# Patient Record
Sex: Female | Born: 1985 | Race: White | Hispanic: No | Marital: Single | Smoking: Current every day smoker
Health system: Southern US, Community
[De-identification: ages and names within clinical notes are randomized; demographics above are authoritative.]

## PROBLEM LIST (undated history)

## (undated) DIAGNOSIS — F1120 Opioid dependence, uncomplicated: Secondary | ICD-10-CM

## (undated) DIAGNOSIS — N179 Acute kidney failure, unspecified: Secondary | ICD-10-CM

## (undated) DIAGNOSIS — M6282 Rhabdomyolysis: Secondary | ICD-10-CM

## (undated) DIAGNOSIS — Z8742 Personal history of other diseases of the female genital tract: Secondary | ICD-10-CM

## (undated) HISTORY — DX: Rhabdomyolysis: M62.82

## (undated) HISTORY — PX: KNEE SURGERY: SHX244

## (undated) HISTORY — DX: Opioid dependence, uncomplicated: F11.20

## (undated) HISTORY — PX: TUBAL LIGATION: SHX77

## (undated) HISTORY — DX: Personal history of other diseases of the female genital tract: Z87.42

## (undated) HISTORY — DX: Acute kidney failure, unspecified: N17.9

---
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
## 2004-06-19 ENCOUNTER — Emergency Department (HOSPITAL_COMMUNITY): Admission: EM | Admit: 2004-06-19 | Discharge: 2004-06-19 | Payer: Self-pay | Admitting: Emergency Medicine

## 2004-08-08 ENCOUNTER — Emergency Department (HOSPITAL_COMMUNITY): Admission: EM | Admit: 2004-08-08 | Discharge: 2004-08-08 | Payer: Self-pay | Admitting: *Deleted

## 2005-11-28 ENCOUNTER — Emergency Department (HOSPITAL_COMMUNITY): Admission: EM | Admit: 2005-11-28 | Discharge: 2005-11-28 | Payer: Self-pay | Admitting: Emergency Medicine

## 2006-11-16 ENCOUNTER — Encounter: Payer: Self-pay | Admitting: Advanced Practice Midwife

## 2006-11-16 ENCOUNTER — Inpatient Hospital Stay (HOSPITAL_COMMUNITY): Admission: AD | Admit: 2006-11-16 | Discharge: 2006-11-18 | Payer: Self-pay | Admitting: Obstetrics and Gynecology

## 2006-11-16 ENCOUNTER — Ambulatory Visit: Payer: Self-pay | Admitting: Obstetrics & Gynecology

## 2007-06-28 ENCOUNTER — Other Ambulatory Visit: Admission: RE | Admit: 2007-06-28 | Discharge: 2007-06-28 | Payer: Self-pay | Admitting: Unknown Physician Specialty

## 2009-03-18 ENCOUNTER — Emergency Department (HOSPITAL_COMMUNITY): Admission: EM | Admit: 2009-03-18 | Discharge: 2009-03-18 | Payer: Self-pay | Admitting: Emergency Medicine

## 2009-10-30 ENCOUNTER — Encounter: Admission: RE | Admit: 2009-10-30 | Discharge: 2009-10-30 | Payer: Self-pay | Admitting: Family Medicine

## 2010-03-10 ENCOUNTER — Encounter
Admission: RE | Admit: 2010-03-10 | Discharge: 2010-03-10 | Payer: Self-pay | Source: Home / Self Care | Attending: Orthopedic Surgery | Admitting: Orthopedic Surgery

## 2010-03-25 ENCOUNTER — Inpatient Hospital Stay (HOSPITAL_COMMUNITY)
Admission: AD | Admit: 2010-03-25 | Discharge: 2010-03-25 | Payer: Self-pay | Source: Home / Self Care | Attending: Obstetrics and Gynecology | Admitting: Obstetrics and Gynecology

## 2010-03-25 DIAGNOSIS — K429 Umbilical hernia without obstruction or gangrene: Secondary | ICD-10-CM

## 2010-03-25 LAB — URINALYSIS, ROUTINE W REFLEX MICROSCOPIC
Bilirubin Urine: NEGATIVE
Hgb urine dipstick: NEGATIVE
Specific Gravity, Urine: 1.02 (ref 1.005–1.030)
Urine Glucose, Fasting: NEGATIVE mg/dL
Urobilinogen, UA: 0.2 mg/dL (ref 0.0–1.0)

## 2010-03-25 LAB — CBC
MCH: 29.8 pg (ref 26.0–34.0)
MCHC: 32.9 g/dL (ref 30.0–36.0)
Platelets: 233 10*3/uL (ref 150–400)

## 2010-06-18 ENCOUNTER — Inpatient Hospital Stay (HOSPITAL_COMMUNITY)
Admission: AD | Admit: 2010-06-18 | Discharge: 2010-06-20 | DRG: 775 | Disposition: A | Discharge status: Home / Self Care | Payer: Medicaid Other | Source: Ambulatory Visit | Attending: Obstetrics and Gynecology | Admitting: Obstetrics and Gynecology

## 2010-06-18 LAB — CBC
MCH: 29.1 pg (ref 26.0–34.0)
MCV: 88.3 fL (ref 78.0–100.0)
Platelets: 266 10*3/uL (ref 150–400)
RDW: 13.3 % (ref 11.5–15.5)
WBC: 11.6 10*3/uL — ABNORMAL HIGH (ref 4.0–10.5)

## 2010-06-18 LAB — ABO/RH: ABO/RH(D): O POS

## 2010-06-19 LAB — CBC
Hemoglobin: 11.9 g/dL — ABNORMAL LOW (ref 12.0–15.0)
MCH: 29 pg (ref 26.0–34.0)
MCHC: 32.2 g/dL (ref 30.0–36.0)
Platelets: 206 10*3/uL (ref 150–400)
RDW: 13.4 % (ref 11.5–15.5)

## 2010-07-15 ENCOUNTER — Other Ambulatory Visit: Payer: Self-pay | Admitting: Obstetrics and Gynecology

## 2010-07-15 ENCOUNTER — Encounter (HOSPITAL_COMMUNITY): Payer: Medicaid Other | Attending: Obstetrics and Gynecology

## 2010-07-15 DIAGNOSIS — Z01812 Encounter for preprocedural laboratory examination: Secondary | ICD-10-CM | POA: Insufficient documentation

## 2010-07-15 DIAGNOSIS — Z01818 Encounter for other preprocedural examination: Secondary | ICD-10-CM | POA: Insufficient documentation

## 2010-07-15 LAB — CBC
HCT: 42.4 % (ref 36.0–46.0)
Hemoglobin: 13.9 g/dL (ref 12.0–15.0)
MCV: 88.7 fL (ref 78.0–100.0)
RBC: 4.78 MIL/uL (ref 3.87–5.11)
RDW: 12.8 % (ref 11.5–15.5)
WBC: 8.6 10*3/uL (ref 4.0–10.5)

## 2010-07-15 LAB — SURGICAL PCR SCREEN: MRSA, PCR: NEGATIVE

## 2010-07-23 LAB — PREGNANCY, URINE: Preg Test, Ur: NEGATIVE

## 2010-08-02 ENCOUNTER — Inpatient Hospital Stay (HOSPITAL_COMMUNITY)
Admission: AD | Admit: 2010-08-02 | Discharge: 2010-08-02 | Disposition: A | Discharge status: Other Acute Inpatient Hospital | Payer: Medicaid Other | Source: Ambulatory Visit | Attending: Obstetrics and Gynecology | Admitting: Obstetrics and Gynecology

## 2010-08-02 ENCOUNTER — Emergency Department (HOSPITAL_COMMUNITY)
Admission: EM | Admit: 2010-08-02 | Discharge: 2010-08-02 | Disposition: A | Discharge status: Home / Self Care | Payer: Medicaid Other | Attending: Emergency Medicine | Admitting: Emergency Medicine

## 2010-08-02 DIAGNOSIS — O99893 Other specified diseases and conditions complicating puerperium: Secondary | ICD-10-CM | POA: Insufficient documentation

## 2010-08-02 DIAGNOSIS — K420 Umbilical hernia with obstruction, without gangrene: Secondary | ICD-10-CM | POA: Insufficient documentation

## 2010-08-02 DIAGNOSIS — O9989 Other specified diseases and conditions complicating pregnancy, childbirth and the puerperium: Secondary | ICD-10-CM

## 2010-08-02 DIAGNOSIS — K429 Umbilical hernia without obstruction or gangrene: Secondary | ICD-10-CM | POA: Insufficient documentation

## 2010-08-02 DIAGNOSIS — F17200 Nicotine dependence, unspecified, uncomplicated: Secondary | ICD-10-CM | POA: Insufficient documentation

## 2010-08-02 DIAGNOSIS — R109 Unspecified abdominal pain: Secondary | ICD-10-CM

## 2010-08-02 DIAGNOSIS — R10815 Periumbilic abdominal tenderness: Secondary | ICD-10-CM | POA: Insufficient documentation

## 2010-08-02 LAB — URINALYSIS, ROUTINE W REFLEX MICROSCOPIC
Hgb urine dipstick: NEGATIVE
Nitrite: NEGATIVE
Protein, ur: NEGATIVE mg/dL
Specific Gravity, Urine: >1.03 — ABNORMAL HIGH (ref 1.005–1.030)
Urobilinogen, UA: 0.2 mg/dL (ref 0.0–1.0)

## 2010-08-02 LAB — POCT PREGNANCY, URINE: Preg Test, Ur: NEGATIVE

## 2010-08-12 ENCOUNTER — Other Ambulatory Visit (HOSPITAL_COMMUNITY): Payer: Medicaid Other

## 2010-08-20 ENCOUNTER — Ambulatory Visit (HOSPITAL_COMMUNITY)
Admission: RE | Admit: 2010-08-20 | Discharge: 2010-08-20 | Disposition: A | Discharge status: Home / Self Care | Payer: Medicaid Other | Source: Ambulatory Visit | Attending: Obstetrics and Gynecology | Admitting: Obstetrics and Gynecology

## 2010-08-20 DIAGNOSIS — Z01818 Encounter for other preprocedural examination: Secondary | ICD-10-CM | POA: Insufficient documentation

## 2010-08-20 DIAGNOSIS — Z302 Encounter for sterilization: Secondary | ICD-10-CM | POA: Insufficient documentation

## 2010-08-20 DIAGNOSIS — Z01812 Encounter for preprocedural laboratory examination: Secondary | ICD-10-CM | POA: Insufficient documentation

## 2010-08-20 LAB — CBC
HCT: 42 % (ref 36.0–46.0)
Hemoglobin: 13.9 g/dL (ref 12.0–15.0)
MCV: 88.8 fL (ref 78.0–100.0)
RBC: 4.73 MIL/uL (ref 3.87–5.11)
WBC: 8.7 10*3/uL (ref 4.0–10.5)

## 2010-08-20 LAB — SURGICAL PCR SCREEN
MRSA, PCR: NEGATIVE
Staphylococcus aureus: NEGATIVE

## 2010-08-22 ENCOUNTER — Inpatient Hospital Stay (HOSPITAL_COMMUNITY)
Admission: AD | Admit: 2010-08-22 | Discharge: 2010-08-22 | Disposition: A | Discharge status: Home / Self Care | Payer: Medicaid Other | Source: Ambulatory Visit | Attending: Obstetrics and Gynecology | Admitting: Obstetrics and Gynecology

## 2010-08-22 DIAGNOSIS — G8918 Other acute postprocedural pain: Secondary | ICD-10-CM | POA: Insufficient documentation

## 2010-08-22 LAB — CBC
Hemoglobin: 11.6 g/dL — ABNORMAL LOW (ref 12.0–15.0)
MCH: 28.8 pg (ref 26.0–34.0)
MCHC: 32.3 g/dL (ref 30.0–36.0)
MCV: 89.1 fL (ref 78.0–100.0)
RBC: 4.03 MIL/uL (ref 3.87–5.11)

## 2010-08-28 ENCOUNTER — Emergency Department (HOSPITAL_COMMUNITY)
Admission: EM | Admit: 2010-08-28 | Discharge: 2010-08-28 | Disposition: A | Discharge status: Home / Self Care | Payer: Self-pay | Attending: Emergency Medicine | Admitting: Emergency Medicine

## 2010-08-28 DIAGNOSIS — S61409A Unspecified open wound of unspecified hand, initial encounter: Secondary | ICD-10-CM | POA: Insufficient documentation

## 2010-08-28 DIAGNOSIS — M79609 Pain in unspecified limb: Secondary | ICD-10-CM | POA: Insufficient documentation

## 2010-08-28 DIAGNOSIS — Y92009 Unspecified place in unspecified non-institutional (private) residence as the place of occurrence of the external cause: Secondary | ICD-10-CM | POA: Insufficient documentation

## 2010-08-28 DIAGNOSIS — W261XXA Contact with sword or dagger, initial encounter: Secondary | ICD-10-CM | POA: Insufficient documentation

## 2010-08-28 DIAGNOSIS — W260XXA Contact with knife, initial encounter: Secondary | ICD-10-CM | POA: Insufficient documentation

## 2010-09-03 NOTE — Op Note (Signed)
  NAMEKERRYN, Alejandra Park              ACCOUNT NO.:  [id number]  MEDICAL RECORD NO.:  [id number]  LOCATION:  [facility]                          FACILITY:  [facility]  PHYSICIAN:  Malva Limes, M.D.    DATE OF BIRTH:  1985-04-07  DATE OF PROCEDURE:  08/20/2010 DATE OF DISCHARGE:                              OPERATIVE REPORT   PREOPERATIVE DIAGNOSIS:  The patient desires permanent sterilization.  POSTOPERATIVE DIAGNOSIS:  The patient desires permanent sterilization.  PROCEDURE:  Laparoscopy with application of Filshie clips.  SURGEON:  Malva Limes, MD.  ANESTHESIA:  General with local.  ANTIBIOTIC:  Ancef 1 gram.  DRAINS:  Foley bedside drainage.  ESTIMATED BLOOD LOSS:  Minimal.  SPECIMENS:  None.  COMPLICATIONS:  None.  FINDINGS:  The patient had normal fallopian tubes and ovaries bilaterally.  The uterus appeared to be normal.  There is no evidence of adhesions or endometriosis.  The patient did have a 1-cm umbilical hernia.  DESCRIPTION OF PROCEDURE:  The patient was taken to the operating room where a general anesthetic was administered without difficulty.  She was then placed in dorsal lithotomy position.  She was prepped and draped in the usual fashion for this procedure.  A Hulka tenaculum was applied to the anterior cervical lip.  The patient was prepped.  The bladder was drained.  The umbilicus was injected with 0.25% Marcaine.  A vertical skin incision was made.  The hernia was identified.  The hernia was enlarged and the parietal peritoneum entered with blunt dissection.  The 12-mm Hasson cannula was placed into the abdominal cavity and sutured in place.  Three liters of carbon dioxide was insufflated.  The patient was placed in Trendelenburg and examination of the abdominal and pelvic contents was undertaken.  At this point, a Filshie clip was placed in the isthmic portion of the right fallopian tube.  The entire tube appeared to be within the clasp.  The clasp  appeared to be tightly closed.  It was placed perpendicular to the tube.  A similar procedure was performed on the opposite side.  At this point, the pneumoperitoneum was released.  The instruments removed.  The fascia and peritoneum were closed with interrupted 0 Vicryl suture.  The skin with 4-0 Vicryl suture.  The patient was extubated, taken to recovery room in stable condition. Instrument and lap counts correct x2.  The patient was discharged to home.  She will be follow up in the office in 4 weeks.  She was sent home with Percocet to take p.r.n.          ______________________________ Malva Limes, M.D.     MA/MEDQ  D:  08/20/2010  T:  08/21/2010  Job:  [id number]  Electronically Signed by Malva Limes M.D. on 09/03/2010 08:49:12 PM

## 2010-09-16 ENCOUNTER — Ambulatory Visit: Payer: Commercial Managed Care - PPO | Admitting: Physical Therapy

## 2010-10-01 ENCOUNTER — Ambulatory Visit: Payer: Commercial Managed Care - PPO | Attending: Family Medicine | Admitting: Physical Therapy

## 2010-10-01 DIAGNOSIS — M545 Low back pain, unspecified: Secondary | ICD-10-CM | POA: Insufficient documentation

## 2010-10-01 DIAGNOSIS — IMO0001 Reserved for inherently not codable concepts without codable children: Secondary | ICD-10-CM | POA: Insufficient documentation

## 2010-10-01 DIAGNOSIS — R293 Abnormal posture: Secondary | ICD-10-CM | POA: Insufficient documentation

## 2010-10-01 DIAGNOSIS — M6281 Muscle weakness (generalized): Secondary | ICD-10-CM | POA: Insufficient documentation

## 2010-10-07 ENCOUNTER — Ambulatory Visit: Payer: Commercial Managed Care - PPO | Admitting: Physical Therapy

## 2010-10-09 ENCOUNTER — Ambulatory Visit: Payer: Commercial Managed Care - PPO | Admitting: Physical Therapy

## 2010-10-13 ENCOUNTER — Encounter: Payer: Commercial Managed Care - PPO | Admitting: Physical Therapy

## 2010-10-15 ENCOUNTER — Encounter: Payer: Commercial Managed Care - PPO | Admitting: Physical Therapy

## 2010-10-29 ENCOUNTER — Ambulatory Visit: Payer: Commercial Managed Care - PPO | Attending: Family Medicine

## 2010-10-29 DIAGNOSIS — M545 Low back pain, unspecified: Secondary | ICD-10-CM | POA: Insufficient documentation

## 2010-10-29 DIAGNOSIS — R293 Abnormal posture: Secondary | ICD-10-CM | POA: Insufficient documentation

## 2010-10-29 DIAGNOSIS — M6281 Muscle weakness (generalized): Secondary | ICD-10-CM | POA: Insufficient documentation

## 2010-10-29 DIAGNOSIS — IMO0001 Reserved for inherently not codable concepts without codable children: Secondary | ICD-10-CM | POA: Insufficient documentation

## 2010-10-30 ENCOUNTER — Ambulatory Visit: Payer: Commercial Managed Care - PPO

## 2010-11-13 ENCOUNTER — Ambulatory Visit: Payer: Commercial Managed Care - PPO

## 2010-12-04 LAB — RAPID URINE DRUG SCREEN, HOSP PERFORMED
Benzodiazepines: NOT DETECTED
Cocaine: NOT DETECTED
Opiates: NOT DETECTED
Tetrahydrocannabinol: NOT DETECTED

## 2010-12-04 LAB — CBC
HCT: 40.4
MCV: 89.3
Platelets: 294
RBC: 4.52
WBC: 14.4 — ABNORMAL HIGH

## 2011-05-23 ENCOUNTER — Emergency Department (HOSPITAL_COMMUNITY)
Admission: EM | Admit: 2011-05-23 | Discharge: 2011-05-24 | Disposition: A | Discharge status: Home / Self Care | Payer: Commercial Managed Care - PPO | Attending: Emergency Medicine | Admitting: Emergency Medicine

## 2011-05-23 ENCOUNTER — Encounter (HOSPITAL_COMMUNITY): Payer: Self-pay | Admitting: Emergency Medicine

## 2011-05-23 ENCOUNTER — Emergency Department (HOSPITAL_COMMUNITY): Payer: Commercial Managed Care - PPO

## 2011-05-23 DIAGNOSIS — M25569 Pain in unspecified knee: Secondary | ICD-10-CM | POA: Insufficient documentation

## 2011-05-23 DIAGNOSIS — R079 Chest pain, unspecified: Secondary | ICD-10-CM | POA: Insufficient documentation

## 2011-05-23 DIAGNOSIS — M545 Low back pain, unspecified: Secondary | ICD-10-CM | POA: Insufficient documentation

## 2011-05-23 DIAGNOSIS — G8929 Other chronic pain: Secondary | ICD-10-CM | POA: Insufficient documentation

## 2011-05-23 DIAGNOSIS — IMO0002 Reserved for concepts with insufficient information to code with codable children: Secondary | ICD-10-CM | POA: Insufficient documentation

## 2011-05-23 DIAGNOSIS — M542 Cervicalgia: Secondary | ICD-10-CM | POA: Insufficient documentation

## 2011-05-23 DIAGNOSIS — S2220XA Unspecified fracture of sternum, initial encounter for closed fracture: Secondary | ICD-10-CM | POA: Insufficient documentation

## 2011-05-23 DIAGNOSIS — M25579 Pain in unspecified ankle and joints of unspecified foot: Secondary | ICD-10-CM | POA: Insufficient documentation

## 2011-05-23 NOTE — ED Notes (Signed)
Patient's responses are appropriate per question, however responses are sluggish.
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
## 2011-05-24 ENCOUNTER — Emergency Department (HOSPITAL_COMMUNITY): Payer: Commercial Managed Care - PPO

## 2011-05-24 ENCOUNTER — Other Ambulatory Visit: Payer: Self-pay

## 2011-05-24 ENCOUNTER — Encounter (HOSPITAL_COMMUNITY): Payer: Self-pay | Admitting: Emergency Medicine

## 2011-05-24 LAB — URINALYSIS, MICROSCOPIC ONLY
Glucose, UA: NEGATIVE mg/dL
Nitrite: NEGATIVE
Specific Gravity, Urine: 1.03 (ref 1.005–1.030)
pH: 6 (ref 5.0–8.0)

## 2011-05-24 LAB — CBC
Hemoglobin: 13.3 g/dL (ref 12.0–15.0)
Platelets: 236 10*3/uL (ref 150–400)
RBC: 4.5 MIL/uL (ref 3.87–5.11)
WBC: 10.9 10*3/uL — ABNORMAL HIGH (ref 4.0–10.5)

## 2011-05-24 LAB — PROTIME-INR: INR: 1.02 (ref 0.00–1.49)

## 2011-05-24 LAB — POCT I-STAT, CHEM 8
BUN: 10 mg/dL (ref 6–23)
Chloride: 107 mEq/L (ref 96–112)
Potassium: 3.5 mEq/L (ref 3.5–5.1)
Sodium: 143 mEq/L (ref 135–145)
TCO2: 26 mmol/L (ref 0–100)

## 2011-05-24 LAB — POCT PREGNANCY, URINE: Preg Test, Ur: NEGATIVE

## 2011-05-24 LAB — COMPREHENSIVE METABOLIC PANEL
AST: 15 U/L (ref 0–37)
BUN: 11 mg/dL (ref 6–23)
CO2: 27 mEq/L (ref 19–32)
Calcium: 9 mg/dL (ref 8.4–10.5)
Chloride: 105 mEq/L (ref 96–112)
Creatinine, Ser: 0.63 mg/dL (ref 0.50–1.10)
GFR calc non Af Amer: >90 mL/min (ref >90)
Total Bilirubin: 0.2 mg/dL — ABNORMAL LOW (ref 0.3–1.2)

## 2011-05-24 LAB — RAPID URINE DRUG SCREEN, HOSP PERFORMED
Amphetamines: NOT DETECTED
Benzodiazepines: POSITIVE — AB
Opiates: POSITIVE — AB

## 2011-05-24 LAB — ETHANOL: Alcohol, Ethyl (B): <11 mg/dL (ref 0–11)

## 2011-05-24 MED ORDER — FENTANYL CITRATE 0.05 MG/ML IJ SOLN
50.0000 ug | INTRAMUSCULAR | Status: AC | PRN
  Administered 2011-05-24 (×2): 50 ug via INTRAVENOUS
  Filled 2011-05-24 (×2): qty 2

## 2011-05-24 MED ORDER — IOHEXOL 300 MG/ML  SOLN: 100.0000 mL | Freq: Once | INTRAMUSCULAR | Status: DC | PRN

## 2011-05-24 MED ORDER — OXYCODONE-ACETAMINOPHEN 5-325 MG PO TABS: 1.0000 | ORAL_TABLET | ORAL | Status: DC | PRN

## 2011-05-24 MED ORDER — FENTANYL CITRATE 0.05 MG/ML IJ SOLN
50.0000 ug | Freq: Once | INTRAMUSCULAR | Status: AC
  Administered 2011-05-24: 50 ug via INTRAVENOUS
  Filled 2011-05-24: qty 2

## 2011-05-24 MED ORDER — SODIUM CHLORIDE 0.9 % IV SOLN
Freq: Once | INTRAVENOUS | Status: AC
  Administered 2011-05-24: 08:00:00 via INTRAVENOUS

## 2011-05-24 MED ORDER — IOHEXOL 300 MG/ML  SOLN
100.0000 mL | Freq: Once | INTRAMUSCULAR | Status: AC | PRN
  Administered 2011-05-24: 100 mL via INTRAVENOUS
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
## 2011-05-24 NOTE — ED Notes (Signed)
INFORMED NURSE OF LOW BP

## 2011-05-24 NOTE — ED Notes (Addendum)
Family Contact- Shauntia Levengood (Mother)  [phone]
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
## 2011-05-24 NOTE — ED Notes (Signed)
MD made aware of patient's low BP. Instructed to continue to monitor, notify if patient develops any symptoms of hypotension.
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
## 2011-05-24 NOTE — ED Notes (Signed)
Pt d/c home in NAD. Instructed not to drive on medications. Pt voiced understanding of d/c instructions and medications. Pt ambulated with steady gait. Pt has ride from boyfriend.

## 2011-05-24 NOTE — ED Notes (Signed)
Meal tray given for breakfast

## 2011-05-24 NOTE — ED Notes (Signed)
Dr. Ignacia Palma made aware of pt's request for other pain medication. No further orders received at this time.

## 2011-05-24 NOTE — ED Notes (Signed)
EKG DONE BY EMT R Zaylah Blecha 

## 2011-05-24 NOTE — ED Notes (Signed)
EKG GIVEN TO DR [facility]. NO OLD EKG

## 2011-06-01 LAB — CDS SEROLOGY

## 2011-06-04 ENCOUNTER — Emergency Department (HOSPITAL_COMMUNITY)
Admission: EM | Admit: 2011-06-04 | Discharge: 2011-06-05 | Disposition: A | Discharge status: Other Acute Inpatient Hospital | Payer: Self-pay | Attending: Emergency Medicine | Admitting: Emergency Medicine

## 2011-06-04 ENCOUNTER — Encounter (HOSPITAL_COMMUNITY): Payer: Self-pay | Admitting: *Deleted

## 2011-06-04 DIAGNOSIS — R45851 Suicidal ideations: Secondary | ICD-10-CM | POA: Insufficient documentation

## 2011-06-04 DIAGNOSIS — G8929 Other chronic pain: Secondary | ICD-10-CM | POA: Insufficient documentation

## 2011-06-04 DIAGNOSIS — M549 Dorsalgia, unspecified: Secondary | ICD-10-CM | POA: Insufficient documentation

## 2011-06-04 DIAGNOSIS — F1110 Opioid abuse, uncomplicated: Secondary | ICD-10-CM | POA: Insufficient documentation

## 2011-06-04 LAB — COMPREHENSIVE METABOLIC PANEL
ALT: 11 U/L (ref 0–35)
AST: 12 U/L (ref 0–37)
Albumin: 3.7 g/dL (ref 3.5–5.2)
Alkaline Phosphatase: 59 U/L (ref 39–117)
BUN: 15 mg/dL (ref 6–23)
CO2: 32 mEq/L (ref 19–32)
Calcium: 9.2 mg/dL (ref 8.4–10.5)
Chloride: 99 mEq/L (ref 96–112)
Creatinine, Ser: 0.76 mg/dL (ref 0.50–1.10)
GFR calc Af Amer: >90 mL/min (ref >90)
GFR calc non Af Amer: >90 mL/min (ref >90)
Glucose, Bld: 85 mg/dL (ref 70–99)
Potassium: 4.5 mEq/L (ref 3.5–5.1)
Sodium: 136 mEq/L (ref 135–145)
Total Bilirubin: 0.3 mg/dL (ref 0.3–1.2)
Total Protein: 7 g/dL (ref 6.0–8.3)

## 2011-06-04 LAB — CBC
HCT: 38.5 % (ref 36.0–46.0)
Hemoglobin: 12.5 g/dL (ref 12.0–15.0)
MCH: 29.3 pg (ref 26.0–34.0)
MCHC: 32.5 g/dL (ref 30.0–36.0)
MCV: 90.4 fL (ref 78.0–100.0)
Platelets: 289 10*3/uL (ref 150–400)
RBC: 4.26 MIL/uL (ref 3.87–5.11)
RDW: 14 % (ref 11.5–15.5)
WBC: 9.9 10*3/uL (ref 4.0–10.5)

## 2011-06-04 LAB — RAPID URINE DRUG SCREEN, HOSP PERFORMED
Amphetamines: NOT DETECTED
Barbiturates: NOT DETECTED
Benzodiazepines: POSITIVE — AB
Cocaine: POSITIVE — AB
Opiates: POSITIVE — AB
Tetrahydrocannabinol: NOT DETECTED

## 2011-06-04 LAB — URINALYSIS, ROUTINE W REFLEX MICROSCOPIC
Bilirubin Urine: NEGATIVE
Glucose, UA: NEGATIVE mg/dL
Hgb urine dipstick: NEGATIVE
Ketones, ur: NEGATIVE mg/dL
Leukocytes, UA: NEGATIVE
Nitrite: NEGATIVE
Protein, ur: NEGATIVE mg/dL
Specific Gravity, Urine: 1.022 (ref 1.005–1.030)
Urobilinogen, UA: 0.2 mg/dL (ref 0.0–1.0)
pH: 7.5 (ref 5.0–8.0)

## 2011-06-04 LAB — ETHANOL: Alcohol, Ethyl (B): <11 mg/dL (ref 0–11)

## 2011-06-04 LAB — PREGNANCY, URINE: Preg Test, Ur: NEGATIVE

## 2011-06-04 MED ORDER — ONDANSETRON HCL 4 MG PO TABS
4.0000 mg | ORAL_TABLET | Freq: Three times a day (TID) | ORAL | Status: DC | PRN
  Administered 2011-06-05: 4 mg via ORAL
  Filled 2011-06-04: qty 1

## 2011-06-04 MED ORDER — ALUM & MAG HYDROXIDE-SIMETH 200-200-20 MG/5ML PO SUSP: 30.0000 mL | ORAL | Status: DC | PRN

## 2011-06-04 MED ORDER — IBUPROFEN 600 MG PO TABS
600.0000 mg | ORAL_TABLET | Freq: Three times a day (TID) | ORAL | Status: DC | PRN
  Administered 2011-06-05: 600 mg via ORAL
  Filled 2011-06-04: qty 1

## 2011-06-04 MED ORDER — NICOTINE 21 MG/24HR TD PT24
21.0000 mg | MEDICATED_PATCH | Freq: Every day | TRANSDERMAL | Status: DC
  Administered 2011-06-04: 21 mg via TRANSDERMAL
  Filled 2011-06-04: qty 1

## 2011-06-04 MED ORDER — ZOLPIDEM TARTRATE 5 MG PO TABS: 5.0000 mg | ORAL_TABLET | Freq: Every evening | ORAL | Status: DC | PRN

## 2011-06-04 MED ORDER — ACETAMINOPHEN 325 MG PO TABS: 650.0000 mg | ORAL_TABLET | ORAL | Status: DC | PRN

## 2011-06-04 NOTE — ED Notes (Signed)
Arrived with GP and IVC papers, abusing Heroin and Percocet, S/I without plan

## 2011-06-04 NOTE — ED Notes (Signed)
Pt placed in paper scubs, female [employer] remains bedside to observe
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
## 2011-06-04 NOTE — ED Provider Notes (Signed)
History     CSN: [id number]  Arrival date & time 06/04/11  4098   First MD Initiated Contact with Patient 06/04/11 1943      Chief Complaint  Patient presents with  . Medical Clearance    (Consider location/radiation/quality/duration/timing/severity/associated sxs/prior treatment) HPI  Patient presents to the emergency department by [facility]PDwith IVC papers. Papers are taken out by the patient's friend for suicidal ideation without plan. Patient uses heroin and Percocets her last use was this morning. The patient denies being homicidal. The patient states that she is going through withdrawal from heroin and fills terrible. She denies having any medical concerns or should be aware of at this time. Pt denies alcohol abuse. Pt cooperative at this time.   Past Medical History  Diagnosis Date  . Bulging disc   . Umbilical hernia   . Chronic back pain   . Fracture of sternum     Past Surgical History  Procedure Date  . Tubal ligation   . Knee surgery     No family history on file.  History  Substance Use Topics  . Smoking status: Current Everyday Smoker  . Smokeless tobacco: Not on file  . Alcohol Use: Yes    OB History    Grav Para Term Preterm Abortions TAB SAB Ect Mult Living                  Review of Systems  All other systems reviewed and are negative.    Allergies  Review of patient's allergies indicates no known allergies.  Home Medications   Current Outpatient Rx  Name Route Sig Dispense Refill  . OXYCODONE-ACETAMINOPHEN 5-325 MG PO TABS Oral Take 1 tablet by mouth every 4 (four) hours as needed for pain. 20 tablet 0  . OXYCODONE-ACETAMINOPHEN 7.5-325 MG PO TABS Oral Take 1 tablet by mouth every 4 (four) hours as needed. For pain      BP 104/62  Pulse 76  Temp(Src) 98.7 F (37.1 C) (Oral)  Resp 16  Wt 140 lb 4 oz (63.617 kg)  SpO2 99%  LMP 04/28/2011  Physical Exam  Nursing note and vitals reviewed. Constitutional: She appears well-developed and  well-nourished. No distress (pt appears to be uncomfortable).  HENT:  Head: Normocephalic and atraumatic.  Eyes: Pupils are equal, round, and reactive to light.  Neck: Normal range of motion. Neck supple.  Cardiovascular: Normal rate and regular rhythm.   Pulmonary/Chest: Effort normal.  Abdominal: Soft.  Neurological: She is alert.  Skin: Skin is warm and dry.    ED Course  Procedures (including critical care time)   Labs Reviewed  CBC  COMPREHENSIVE METABOLIC PANEL  ETHANOL  URINE RAPID DRUG SCREEN (HOSP PERFORMED)  PREGNANCY, URINE  URINALYSIS, ROUTINE W REFLEX MICROSCOPIC   No results found.   1. Opiate abuse, continuous   2. Suicidal ideation       MDM  ACT team consulted/        Dorthula Matas, PA 06/04/11 2010

## 2011-06-04 NOTE — ED Notes (Signed)
Pt alert, nad, arrives via [employer], pt under IVC, SI without a plan, pt resp even unlabored, skin pwd

## 2011-06-04 NOTE — ED Notes (Signed)
Report to Psych ED 

## 2011-06-05 ENCOUNTER — Inpatient Hospital Stay (HOSPITAL_COMMUNITY)
Admission: AD | Admit: 2011-06-05 | Discharge: 2011-06-09 | DRG: 897 | Disposition: A | Discharge status: Home / Self Care | Payer: PRIVATE HEALTH INSURANCE | Source: Ambulatory Visit | Attending: Psychiatry | Admitting: Psychiatry

## 2011-06-05 ENCOUNTER — Encounter (HOSPITAL_COMMUNITY): Payer: Self-pay | Admitting: *Deleted

## 2011-06-05 DIAGNOSIS — M549 Dorsalgia, unspecified: Secondary | ICD-10-CM | POA: Diagnosis present

## 2011-06-05 DIAGNOSIS — F1121 Opioid dependence, in remission: Secondary | ICD-10-CM | POA: Diagnosis present

## 2011-06-05 DIAGNOSIS — Z653 Problems related to other legal circumstances: Secondary | ICD-10-CM

## 2011-06-05 DIAGNOSIS — F19939 Other psychoactive substance use, unspecified with withdrawal, unspecified: Principal | ICD-10-CM | POA: Diagnosis present

## 2011-06-05 DIAGNOSIS — F1120 Opioid dependence, uncomplicated: Secondary | ICD-10-CM

## 2011-06-05 DIAGNOSIS — F1410 Cocaine abuse, uncomplicated: Secondary | ICD-10-CM | POA: Diagnosis present

## 2011-06-05 DIAGNOSIS — F329 Major depressive disorder, single episode, unspecified: Secondary | ICD-10-CM

## 2011-06-05 DIAGNOSIS — G8929 Other chronic pain: Secondary | ICD-10-CM | POA: Diagnosis present

## 2011-06-05 DIAGNOSIS — F1320 Sedative, hypnotic or anxiolytic dependence, uncomplicated: Secondary | ICD-10-CM

## 2011-06-05 DIAGNOSIS — F1994 Other psychoactive substance use, unspecified with psychoactive substance-induced mood disorder: Secondary | ICD-10-CM

## 2011-06-05 DIAGNOSIS — F1920 Other psychoactive substance dependence, uncomplicated: Secondary | ICD-10-CM

## 2011-06-05 MED ORDER — CLONIDINE HCL 0.1 MG PO TABS
0.1000 mg | ORAL_TABLET | ORAL | Status: AC
  Administered 2011-06-05 – 2011-06-07 (×3): 0.1 mg via ORAL
  Filled 2011-06-05 (×4): qty 1

## 2011-06-05 MED ORDER — NAPROXEN 500 MG PO TABS
500.0000 mg | ORAL_TABLET | Freq: Two times a day (BID) | ORAL | Status: DC | PRN
  Administered 2011-06-05 – 2011-06-08 (×6): 500 mg via ORAL
  Filled 2011-06-05 (×2): qty 1
  Filled 2011-06-05: qty 20
  Filled 2011-06-05 (×4): qty 1

## 2011-06-05 MED ORDER — ONDANSETRON 4 MG PO TBDP
4.0000 mg | ORAL_TABLET | Freq: Four times a day (QID) | ORAL | Status: DC | PRN
  Administered 2011-06-05 – 2011-06-07 (×2): 4 mg via ORAL

## 2011-06-05 MED ORDER — ALUM & MAG HYDROXIDE-SIMETH 200-200-20 MG/5ML PO SUSP: 30.0000 mL | ORAL | Status: DC | PRN

## 2011-06-05 MED ORDER — DICYCLOMINE HCL 20 MG PO TABS
20.0000 mg | ORAL_TABLET | ORAL | Status: DC | PRN
  Administered 2011-06-05 – 2011-06-07 (×2): 20 mg via ORAL
  Filled 2011-06-05 (×2): qty 1

## 2011-06-05 MED ORDER — MAGNESIUM HYDROXIDE 400 MG/5ML PO SUSP: 30.0000 mL | Freq: Every day | ORAL | Status: DC | PRN

## 2011-06-05 MED ORDER — CHLORDIAZEPOXIDE HCL 25 MG PO CAPS
25.0000 mg | ORAL_CAPSULE | ORAL | Status: AC
  Administered 2011-06-08 (×2): 25 mg via ORAL
  Filled 2011-06-05 (×2): qty 1

## 2011-06-05 MED ORDER — ONDANSETRON 4 MG PO TBDP
4.0000 mg | ORAL_TABLET | Freq: Once | ORAL | Status: AC
  Administered 2011-06-05: 4 mg via ORAL

## 2011-06-05 MED ORDER — HYDROXYZINE HCL 25 MG PO TABS
25.0000 mg | ORAL_TABLET | Freq: Every evening | ORAL | Status: DC | PRN
  Administered 2011-06-07: 50 mg via ORAL
  Administered 2011-06-07 – 2011-06-08 (×2): 25 mg via ORAL

## 2011-06-05 MED ORDER — LOPERAMIDE HCL 2 MG PO CAPS: 2.0000 mg | ORAL_CAPSULE | ORAL | Status: DC | PRN

## 2011-06-05 MED ORDER — LORAZEPAM 2 MG/ML IJ SOLN
1.0000 mg | INTRAMUSCULAR | Status: AC
  Administered 2011-06-05: 1 mg via INTRAMUSCULAR
  Filled 2011-06-05: qty 1

## 2011-06-05 MED ORDER — CLONIDINE HCL 0.1 MG PO TABS
0.1000 mg | ORAL_TABLET | Freq: Four times a day (QID) | ORAL | Status: AC
  Administered 2011-06-05 – 2011-06-06 (×4): 0.1 mg via ORAL
  Filled 2011-06-05 (×7): qty 1

## 2011-06-05 MED ORDER — HYDROXYZINE HCL 25 MG PO TABS
25.0000 mg | ORAL_TABLET | Freq: Four times a day (QID) | ORAL | Status: DC | PRN
  Administered 2011-06-05 – 2011-06-06 (×3): 25 mg via ORAL
  Filled 2011-06-05 (×2): qty 1

## 2011-06-05 MED ORDER — CHLORDIAZEPOXIDE HCL 25 MG PO CAPS
25.0000 mg | ORAL_CAPSULE | Freq: Four times a day (QID) | ORAL | Status: AC | PRN
Start: 2011-06-05 — End: 2011-06-07
  Administered 2011-06-05 – 2011-06-07 (×4): 25 mg via ORAL
  Filled 2011-06-05 (×3): qty 1

## 2011-06-05 MED ORDER — CHLORDIAZEPOXIDE HCL 25 MG PO CAPS
25.0000 mg | ORAL_CAPSULE | Freq: Three times a day (TID) | ORAL | Status: AC
  Administered 2011-06-07 (×3): 25 mg via ORAL
  Filled 2011-06-05 (×3): qty 1

## 2011-06-05 MED ORDER — CHLORDIAZEPOXIDE HCL 25 MG PO CAPS
25.0000 mg | ORAL_CAPSULE | Freq: Every day | ORAL | Status: DC
  Filled 2011-06-05: qty 1

## 2011-06-05 MED ORDER — CHLORDIAZEPOXIDE HCL 25 MG PO CAPS
25.0000 mg | ORAL_CAPSULE | Freq: Four times a day (QID) | ORAL | Status: AC
  Administered 2011-06-05 – 2011-06-06 (×3): 25 mg via ORAL
  Filled 2011-06-05 (×5): qty 1

## 2011-06-05 MED ORDER — METHOCARBAMOL 500 MG PO TABS
500.0000 mg | ORAL_TABLET | Freq: Three times a day (TID) | ORAL | Status: DC | PRN
  Administered 2011-06-05 – 2011-06-09 (×8): 500 mg via ORAL
  Filled 2011-06-05: qty 20
  Filled 2011-06-05 (×9): qty 1

## 2011-06-05 MED ORDER — CLONIDINE HCL 0.1 MG PO TABS
0.1000 mg | ORAL_TABLET | Freq: Every day | ORAL | Status: DC
  Filled 2011-06-05 (×2): qty 1

## 2011-06-05 NOTE — Progress Notes (Signed)
[facility] Group Notes:  (Counselor/Nursing/MHT/Case Management/Adjunct)  06/05/2011 12:59 PM  Type of Therapy:  Group Therapy  Participation Level:  Did Not Attend  Wilmon Arms 06/05/2011, 12:59 PMCosigned by Carney Bern, LCSWA 4/12/20133:41 PM

## 2011-06-05 NOTE — Progress Notes (Signed)
06/04/2101 Nursing 1100 Alejandra Park is wretching and observed throwing up greenish- bile liquid as she is admitted to the unit. She is flat, depressed and crying. She refuses to make eye contact with this nurse. A  After she is admitted , she is immediately placed on HIGH fall precautions ( she is too unsteady to stand up to obtain a standing vital signs) and is gvien a clal bed at ehr bedside. At 1040, she was given Zofran 4 mg SL, Librium 25 mg PO and clonidin

## 2011-06-05 NOTE — Tx Team (Signed)
Initial Interdisciplinary Treatment Plan  PATIENT STRENGTHS: (choose at least two) Ability for insight Average or above average intelligence Communication skills General fund of knowledge Supportive family/friends  PATIENT STRESSORS: Financial difficulties Legal issue Substance abuse   PROBLEM LIST: Problem List/Patient Goals Date to be addressed Date deferred Reason deferred Estimated date of resolution  Detox      Decrease Depression                                                 DISCHARGE CRITERIA:  Adequate post-discharge living arrangements Improved stabilization in mood, thinking, and/or behavior Reduction of life-threatening or endangering symptoms to within safe limits Withdrawal symptoms are absent or subacute and managed without 24-hour nursing intervention  PRELIMINARY DISCHARGE PLAN: Attend aftercare/continuing care group Attend 12-step recovery group Outpatient therapy Return to previous living arrangement  PATIENT/FAMIILY INVOLVEMENT: This treatment plan has been presented to and reviewed with the patient, Alejandra Park, and/or family member.  The patient and family have been given the opportunity to ask questions and make suggestions.  [name] 06/05/2011, 9:30 AM
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
## 2011-06-05 NOTE — Progress Notes (Signed)
Report received from K.Winson [employer] fundraiser. Writer observed pt. Lying in bed asleep with eyes closed, resp. even and unlabored, no distress noted. Safety maintained on unit, will continue to monitor.
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
## 2011-06-05 NOTE — ED Notes (Signed)
Patient accepted in transfer to behavioral health  Sunnie Nielsen, MD 06/05/11 [phone]

## 2011-06-05 NOTE — Progress Notes (Signed)
[facility] Group Notes:  (Counselor/Nursing/MHT/Case Management/Adjunct)  06/05/2011 2:51 PM  Type of Therapy:  1:15PM Group Therapy  Participation Level:  Did Not Attend  Wilmon Arms 06/05/2011, 2:51 PM

## 2011-06-05 NOTE — Progress Notes (Signed)
Pt belongings were moved from locker #18 to locker #1 due to the amount of items that were not allowed on unit.

## 2011-06-05 NOTE — ED Provider Notes (Signed)
Medical screening examination/treatment/procedure(s) were performed by non-physician practitioner and as supervising physician I was immediately available for consultation/collaboration.  Raeford Razor, MD 06/05/11 [phone]

## 2011-06-05 NOTE — Progress Notes (Signed)
Cosigned by Carney Bern, LCSWA 4/12/20133:41 PM

## 2011-06-05 NOTE — [facility] Suicide Risk Assessment (Signed)
Suicide Risk Assessment  Admission Assessment      Demographic factors:  See chart.  Current Mental Status:  (Denies SI/HI) per nursing team upon admission.  Patient seen and evaluated. Chart reviewed. Patient stated that her mood was "not good". Sig opioid and benzo w/d noted.  Her affect was mood congruent and irritable. She denied any current thoughts of self injurious behavior, suicidal ideation or homicidal ideation. There were no auditory or visual hallucinations, paranoia, delusional thought processes, or mania noted.  Thought process was linear and goal directed.  No psychomotor agitation or retardation was noted. Speech was normal rate, tone and volume. Eye contact was good. Judgment and insight are fair.  Patient laying in bed with c/o of N, abdominal cramping, sweats and agitation.  No acute safety concerns reported from team.    Loss Factors: Legal issues (DWI)  Historical Factors: Mother taking care of her children; no past Tx or residential Tx   Risk Reduction Factors:  Responsible for children under 66 years of age;Religious beliefs about death;Living with another person, especially a relative;Positive therapeutic relationship;Positive social support; open to residential Tx s/p detox  CLINICAL FACTORS: Opioid Dependence & W/D; Benzo Dependence & W/D; Cocaine Abuse; SIMD   COGNITIVE FEATURES THAT CONTRIBUTE TO RISK: limited insight.   SUICIDE RISK: Pt viewed as a chronic increased risk of harm to self in light of her past hx and risk factors.  No acute safety concerns on the unit.  Pt contracting for safety and in need of crisis stabilization & Tx.  PLAN OF CARE: Pt admitted for crisis stabilization, detox and treatment.  Please see orders.   Medications reviewed with pt and medication education provided.  Will continue q15 minute checks per unit protocol.  No clinical indication for one on one level of observation at this time.  Pt contracting for safety.  Mental health  treatment, medication management and continued sobriety will mitigate against the increased risk of harm to self and/or others.  Discussed the importance of recovery with pt, as well as, tools to move forward in a healthy & safe manner.  Pt agreeable with the plan.  Discussed with the team.   Lupe Carney 06/05/2011, 2:53 PM

## 2011-06-06 LAB — BASIC METABOLIC PANEL
CO2: 24 mEq/L (ref 19–32)
Chloride: 106 mEq/L (ref 96–112)
Creatinine, Ser: 0.6 mg/dL (ref 0.50–1.10)
Sodium: 138 mEq/L (ref 135–145)

## 2011-06-06 LAB — TSH: TSH: 0.093 u[IU]/mL — ABNORMAL LOW (ref 0.350–4.500)
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
## 2011-06-06 NOTE — Progress Notes (Signed)
Patient ID: Alejandra Park, female   DOB: [birth year], 26 y.o.   MRN: [id number] Maansi is coming down the home as a greet her, flushed, holding to the wall while she walks. She is fully alert, disheveled, and in hospital scrubs. Eye contact is poor. Says she feels miserable as I help her to a chair. Sleep fair to poor, denying any suicidal thoughts, so she feels miserable physically, with aches in her legs and limbs, but actually appears in no distress.  Potassium was 4.3, other electrolytes are normal, her vital signs are normal, with a pulse of 88.  Plan: Continue current plan

## 2011-06-06 NOTE — Progress Notes (Signed)
Patient ID: Alejandra Park, female   DOB: Jul 11, 1985, 26 y.o.   MRN: [id number]  [facility] Group Notes:  (Counselor/Nursing/MHT/Case Management/Adjunct)  06/06/2011 1:15 PM  Type of Therapy:  Group Therapy, Dance/Movement Therapy   Participation Level:  Minimal  Participation Quality:  Drowsy  Affect:  Flat  Cognitive:  Confused  Insight:  None  Engagement in Group:  None  Engagement in Therapy:  None  Modes of Intervention:  Clarification, Problem-solving, Role-play, Socialization and Support  Summary of Progress/Problems:  Pt. was sitting in chair in a fetal position and moaning.  Pt. stated that "My heart is beating really fast and in pain".  Therapist located the nurse on the hall.  The nurse escorted patient back to her room.    Rhunette Croft

## 2011-06-06 NOTE — Progress Notes (Signed)
Pt is experiencing extreme body aches, chills, skin crawling. Is weak and needed assistance with ambulation. PRN medications requested and received. Rates depression and hopelessness at 5. Endorses SI and contracts x2 for safety. Stated that those thoughts were from previous day and she "has her children to live for."  Good appetite and fluids encouraged due to decreased BP.   Support offered and cont 15' checks for safety.

## 2011-06-07 DIAGNOSIS — F329 Major depressive disorder, single episode, unspecified: Secondary | ICD-10-CM

## 2011-06-07 DIAGNOSIS — F1320 Sedative, hypnotic or anxiolytic dependence, uncomplicated: Secondary | ICD-10-CM

## 2011-06-07 DIAGNOSIS — F1120 Opioid dependence, uncomplicated: Secondary | ICD-10-CM

## 2011-06-07 MED ORDER — TRAZODONE HCL 50 MG PO TABS
50.0000 mg | ORAL_TABLET | Freq: Every evening | ORAL | Status: DC | PRN
Start: 2011-06-07 — End: 2011-06-09
  Administered 2011-06-07 (×2): 50 mg via ORAL
  Filled 2011-06-07 (×8): qty 1

## 2011-06-07 MED ORDER — ACETAMINOPHEN 325 MG PO TABS
650.0000 mg | ORAL_TABLET | Freq: Four times a day (QID) | ORAL | Status: DC | PRN
  Administered 2011-06-07 – 2011-06-08 (×2): 650 mg via ORAL

## 2011-06-07 MED ORDER — KETOROLAC TROMETHAMINE 30 MG/ML IJ SOLN
30.0000 mg | Freq: Once | INTRAMUSCULAR | Status: AC
  Administered 2011-06-07: 30 mg via INTRAMUSCULAR
  Filled 2011-06-07 (×2): qty 1

## 2011-06-07 MED ORDER — NICOTINE 21 MG/24HR TD PT24
21.0000 mg | MEDICATED_PATCH | Freq: Every day | TRANSDERMAL | Status: DC
  Administered 2011-06-08 – 2011-06-09 (×2): 21 mg via TRANSDERMAL
  Filled 2011-06-07 (×4): qty 1

## 2011-06-07 NOTE — Progress Notes (Signed)
Patient ID: Alejandra Park, female   DOB: 05-21-1985, 26 y.o.   MRN: [id number] Pt. attended and participated in aftercare planning group. Pt. accepted information on suicide prevention, warning signs to look for with suicide and crisis line numbers to use. The pt. agreed to call crisis line numbers if having warning signs or having thoughts of suicide. Pt. listed their current anxiety level as low and depression as low.  Pt. accepted an NA meeting schedule.  Pt. indicated that she has an interest in intensive out care treatment.

## 2011-06-07 NOTE — Progress Notes (Signed)
Pt has been up and has been visible and active in milieu today, pt has stated that she is feeling better today than yesterday, however does endorse having feelings of generalized achiness as well as agitation and cravings. Pt has stated that she does not feel the medication she is receiving is helping her detox, pt denies any suicidal thoughts and has stated that she just wants to get home to see her kids
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
## 2011-06-07 NOTE — Progress Notes (Signed)
Pt requested to stay in bed this shift, asleep when time for medications, so will give if she wakes up and requests.  Pt in no acute distress, respirations even and unlabored.  Support and encouragement offered, will continue to monitor.

## 2011-06-07 NOTE — Progress Notes (Signed)
Patient ID: Alejandra Park, female   DOB: 19-Apr-1985, 26 y.o.   MRN: [id number]  [facility] Group Notes:  (Counselor/Nursing/MHT/Case Management/Adjunct)  06/07/2011 1:15 PM  Type of Therapy:  Group Therapy, Dance/Movement Therapy   Participation Level:  Minimal  Participation Quality:  Attentive  Affect:  Appropriate  Cognitive:  Appropriate  Insight:  Limited  Engagement in Group:  Limited  Engagement in Therapy:  Limited  Modes of Intervention:  Clarification, Problem-solving, Role-play, Socialization and Support  Summary of Progress/Problems:  Pt. entered group toward the end of the session.  Pt. made eye contact and agreed with group members.    Rhunette Croft

## 2011-06-07 NOTE — Progress Notes (Signed)
[facility] Adult Inpatient Family/Significant Other Suicide Prevention Education  Suicide Prevention Education:  Contact Attempts: Hilda Lias (mother) [phone], (name of family member/significant other) has been identified by the patient as the family member/significant other with whom the patient will be residing, and identified as the person(s) who will aid the patient in the event of a mental health crisis.  With written consent from the patient, two attempts were made to provide suicide prevention education, prior to and/or following the patient's discharge.  We were unsuccessful in providing suicide prevention education.  A suicide education pamphlet was given to the patient to share with family/significant other.  Date and time of first attempt: 06/07/11 at 4:36PM Date and time of second attempt:  [facility] 06/07/2011, 4:04 PM

## 2011-06-07 NOTE — Progress Notes (Signed)
Pt pleasant and bright on approach.  Spoke at length about how she and a co-worker are supporting each other to get clean.  Pt also stated that she had considered methadone or suboxone treatment but decided against these because "i didn't want to trade one habit for another".  Pt spoke of her children and her hopes to be clean in the future for a better life for her family.  Still experiencing anxiety, difficulty sleeping, but hopeful that the trazodone ordered tonight will effectively combat this.  Support and encouragement given, praise given for her decisions.  Will continue to monitor.

## 2011-06-08 DIAGNOSIS — F1410 Cocaine abuse, uncomplicated: Secondary | ICD-10-CM

## 2011-06-08 DIAGNOSIS — M549 Dorsalgia, unspecified: Secondary | ICD-10-CM | POA: Diagnosis present

## 2011-06-08 DIAGNOSIS — F1994 Other psychoactive substance use, unspecified with psychoactive substance-induced mood disorder: Secondary | ICD-10-CM

## 2011-06-08 DIAGNOSIS — Z653 Problems related to other legal circumstances: Secondary | ICD-10-CM

## 2011-06-08 DIAGNOSIS — F1920 Other psychoactive substance dependence, uncomplicated: Secondary | ICD-10-CM

## 2011-06-08 DIAGNOSIS — F19939 Other psychoactive substance use, unspecified with withdrawal, unspecified: Principal | ICD-10-CM

## 2011-06-08 MED ORDER — CELECOXIB 100 MG PO CAPS
200.0000 mg | ORAL_CAPSULE | Freq: Every day | ORAL | Status: DC
  Administered 2011-06-08 – 2011-06-09 (×2): 200 mg via ORAL
  Filled 2011-06-08: qty 2
  Filled 2011-06-08 (×3): qty 1
  Filled 2011-06-08: qty 20

## 2011-06-08 MED ORDER — GABAPENTIN 300 MG PO CAPS
300.0000 mg | ORAL_CAPSULE | Freq: Two times a day (BID) | ORAL | Status: DC
  Administered 2011-06-08 – 2011-06-09 (×3): 300 mg via ORAL
  Filled 2011-06-08 (×6): qty 1

## 2011-06-08 NOTE — Treatment Plan (Signed)
Interdisciplinary Treatment Plan Update (Adult)  Date: 06/08/2011  Time Reviewed: 9:52 AM   Progress in Treatment: Attending groups: Yes Participating in groups: Yes Taking medication as prescribed: Yes Tolerating medication: Yes   Family/Significant other contact made:  None Patient understands diagnosis:  Yes  As evidenced by asking for help with heroin detox Discussing patient identified problems/goals with staff:  Yes  See below Medical problems stabilized or resolved:  Yes Denies suicidal/homicidal ideation: Yes  In tx team Issues/concerns per patient self-inventory:  Yes  Lower back pain  Other:  New problem(s) identified: N/A  Reason for Continuation of Hospitalization: Other; describe D/C today  Interventions implemented related to continuation of hospitalization:   Additional comments: Start on Neurontin this AM, monitor for side affects, d/c on med if no side affects  Estimated length of stay:  D/C today  Discharge Plan:Return home,  Go to NA mtgs., follow up [employer] [facility] [place]  New goal(s): N/A  Review of initial/current patient goals per problem list:   1.  Goal(s): Detox from opiates  Met:  Yes  Target date: 4/15  As evidenced by: COWS score of 0, stable vitals  2.  Goal (s): Decrease depression  Met:  Yes  Target date: 4/12  As evidenced by:  As evidenced by Self inventory score of 0  3.  Goal(s):  Met:  Yes  Target date:  As evidenced by:  4.  Goal(s):  Met:  Yes  Target date:  As evidenced by:  Attendees: Patient:  Alejandra Park 06/08/2011 9:52 AM  Family:     Physician:  Lupe Carney 06/08/2011 9:52 AM   Nursing:  Robbie Louis  06/08/2011 9:52 AM   Case Manager:  Richelle Ito, LCSW 06/08/2011 9:52 AM   Counselor:  Ronda Fairly, LCSWA 06/08/2011 9:52 AM   Other:     Other:     Other:     Other:      Scribe for Treatment Team:   Ida Rogue, 06/08/2011 9:52 AM
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
## 2011-06-08 NOTE — Progress Notes (Signed)
Counselor recieved message from [facility] Department of Social Services, [place] McClarity at [phone] extension [phone]. DSS worker requested a call back concerning information regarding patient and request to see patient. Writer contact Ms McClarity at number above and left message concerning requested information consisting of: 1. Yes she can visit patient at [facility] 2. Name and number of [facility] Case Manager, Rod [address]  Clide Dales 06/08/2011  12:44 PM
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
## 2011-06-08 NOTE — H&P (Signed)
Pt seen and evaluated upon admission.  Completed Admission Suicide Risk Assessment.  See orders.  Pt agreeable with plan.  Discussed with team.   

## 2011-06-08 NOTE — Progress Notes (Signed)
[facility] Adult Inpatient Family/Significant Other Suicide Prevention Education  Suicide Prevention Education:  Education Completed; Mother, Hilda Lias, at [phone] has been identified by the patient as the family member/significant other with whom the patient will be residing, and identified as the person(s) who will aid the patient in the event of a mental health crisis (suicidal ideations/suicide attempt).  With written consent from the patient, the family member/significant other has been provided the following suicide prevention education, prior to the and/or following the discharge of the patient.  The suicide prevention education provided includes the following:  Suicide risk factors  Suicide prevention and interventions  National Suicide Hotline telephone number  [facility] assessment telephone number  [facility] Emergency Assistance 911  [facility] and/or Residential Mobile Crisis Unit telephone number  Request made of family/significant other to:  Remove weapons (e.g., guns, rifles, knives), all items previously/currently identified as safety concern.    Remove drugs/medications (over-the-counter, prescriptions, illicit drugs), all items previously/currently identified as a safety concern.  The family member/significant other verbalizes understanding of the suicide prevention education information provided.  The family member/significant other agrees to remove the items of safety concern listed above.  Alejandra Park 06/08/2011, 12:46 PM

## 2011-06-08 NOTE — Progress Notes (Signed)
[facility] Case Management Discharge Plan:  Will you be returning to the same living situation after discharge: Yes,  with mother At discharge, do you have transportation home?:Yes,  mother Do you have the ability to pay for your medications:Yes Mother will help with Neurontin    Interagency Information:     Release of information consent forms completed and in the chart;  Patient's signature needed at discharge.  Patient to Follow up at:  Follow-up Information    Follow up with [facility] of the [place] on 06/10/2011. (Walk in for a screening on Wed or Thurs at 8 AM  You will be assigned a therapist after that initial screening)    Contact information:   [address]  [place]  [phone]         Patient denies SI/HI:   Yes,  yes    Safety Planning and Suicide Prevention discussed:  Yes,  yes  Barrier to discharge identified:No.  Summary and Recommendations:   Alejandra Park 06/08/2011, 11:06 AM

## 2011-06-09 DIAGNOSIS — F1120 Opioid dependence, uncomplicated: Secondary | ICD-10-CM | POA: Diagnosis present

## 2011-06-09 DIAGNOSIS — F1994 Other psychoactive substance use, unspecified with psychoactive substance-induced mood disorder: Secondary | ICD-10-CM | POA: Diagnosis present

## 2011-06-09 MED ORDER — TRAZODONE HCL 50 MG PO TABS: 50.0000 mg | ORAL_TABLET | Freq: Every evening | ORAL | Status: DC | PRN

## 2011-06-09 MED ORDER — CELECOXIB 200 MG PO CAPS: 200.0000 mg | ORAL_CAPSULE | Freq: Every day | ORAL | Status: AC

## 2011-06-09 MED ORDER — GABAPENTIN 300 MG PO CAPS: 300.0000 mg | ORAL_CAPSULE | Freq: Two times a day (BID) | ORAL | Status: DC

## 2011-06-09 MED ORDER — METHOCARBAMOL 500 MG PO TABS: 500.0000 mg | ORAL_TABLET | Freq: Three times a day (TID) | ORAL | Status: AC | PRN

## 2011-06-09 NOTE — Progress Notes (Signed)
Patient ID: Alejandra Park, female   DOB: [birth year], 26 y.o.   MRN: [id number] Patient was pleasant and cooperative during the assessment. Pt refused her trazodone stating that it has the opposite affect. Asked the writer if she (pt) was being discharged tomorrow. Pt informed the writer of the incident surrounding her potential discharge today, and why it didn't occur. Support and encouragement was offered.

## 2011-06-09 NOTE — [facility] Suicide Risk Assessment (Signed)
Suicide Risk Assessment  Discharge Assessment     Demographic factors:  Adolescent or young adult;Unemployed;Caucasian  Current Mental Status Per Nursing Assessment::   On Admission:   (Denies SI/HI) At Discharge:  AO x 3.  Denies SI/HI. No depressive or anxiety symptoms reported.  No auditory or visual hallucinations or delusional thinking.  Current Mental Status Per Physician:  Diagnosis:  Axis I: Polysubstance Dependence - Opioids, Cannabis, Benzodiazepines.  Substance Induced Mood Disorder.   The patient was seen today and reports the following:   Sleep: The patient reports to sleeping well last night  Appetite: The patient reports a good appetite.   Mild>(1-10) >Severe  Hopelessness (1-10): 0  Depression (1-10): 0  Anxiety (1-10): 0   Suicidal Ideation: The patient adamantly denies any suicidal ideations today.  Plan: No  Intent: No  Means: No   Homicidal Ideation: The patient adamantly denies any homicidal ideations today.  Plan: No  Intent: No.  Means: No   Eye Contact: Good.  General Appearance/Behavior: The patient was friendly and cooperative during the interview today and was in no apparent distress.  Motor Behavior: wnl.  Speech: Appropriate in rate and volume with no pressuring of speech today.  Mental Status: Alert and Oriented x 3  Level of Consciousness: Alert  Mood: Euthymic.  Affect: Bright and Full.  Anxiety: No anxiety reported today.  Thought Process: wnl.  Thought Content: The patient denied any auditory or visual hallucinations today. She also denied any delusional thinking.  Perception: wnl.  Judgment: Good.  Insight: Good.  Cognition: Orientated to person, place and time.   Loss Factors: Legal issues (DWI)  Historical Factors: History of polysubstance dependence.  Risk Reduction Factors:   Good family support.  Good access to community resources.  Continued Clinical Symptoms:  Alcohol/Substance Abuse/Dependencies More than one  psychiatric diagnosis Previous Psychiatric Diagnoses and Treatments Medical Diagnoses and Treatments/Surgeries  Discharge Diagnoses:   AXIS I:  Polysubstance Dependence - Opioids, Cannabis, Benzodiazepines.    Substance Induced Mood Disorder.  AXIS II:   Deferred. AXIS III:   1.  Herniated Disc.   2.  Umbilical Hernia.   3.  Chronic Back Pain.   4.  S/P Fracture of Sternum. AXIS IV:   History of Substance Abuse. AXIS V:   GAF at time of admission approximately 40.  GAF at time of discharge approximately 70.  Cognitive Features That Contribute To Risk:  None Noted.  The patient was seen today and reports to feeling much better and is ready for discharge.  She reports to sleeping well as well as a good appetite.  The patient denies any significant feelings of sadness, anhedonia or depressed mood and denies any suicidal or homicidal ideations.  She also denies any auditory or visual hallucinations or delusional thinking.  In addition, the patient denies any symptoms of substance withdrawal.  She plans to live with there "best friend" at discharge and attend NA meetings.  She will be discharged today as requested to outpatient follow up.  Suicide Risk:  Minimal: No identifiable suicidal ideation.  Patients presenting with no risk factors but with morbid ruminations; may be classified as minimal risk based on the severity of the depressive symptoms  Plan Of Care/Follow-up recommendations:  Activity:  As tolerated. Diet:  Regular Diet. Other:  Please abstain from any use of alcohol or illicit drugs.  Please take all medications only as directed and keep all scheduled follow up appointments.  Tyja Gortney 06/09/2011, 9:29 AM

## 2011-06-09 NOTE — Progress Notes (Signed)
[facility] Group Notes:  (Counselor/Nursing/MHT/Case Management/Adjunct)  06/09/2011 12:35 PM   Type of Therapy:  Processing Group at 11:00 am at 11:00 AM  Participation Level:  Active  Participation Quality:  Attentive and sharinging  Affect:  Exhausted  Cognitive:  Oriented  Insight:  Good  Engagement in Group:  Good  Engagement in Therapy:  Limited  Modes of Intervention:  Exploration clarification and support  Summary of Progress/Problems: Alejandra Park shared some of her obstacles are related to financial needs, supportivr environment, resistance to a spiritual connection and intellectual resistance. "All these things keep me from forming a spiritual connection." Patient was attentive to discussion from others  [facility] Group Notes:  (Counselor/Nursing/MHT/Case Management/Adjunct)  06/09/2011 12:35 PM   Type of Therapy:  Counseling Group at 1:15 pm on 06/08/11  Participation Level:  None  Participation Quality:  Asleep  Ronda Fairly, LCSWA 06/09/2011 12:35 PM

## 2011-06-09 NOTE — Discharge Summary (Signed)
Physician Discharge Summary Note  Patient:  Alejandra Park is an 26 y.o., female MRN:  [id number] DOB:  [birth year] Patient phone:  [phone] (home)  Patient address:   [address] [place] [address],   Date of Admission:  06/05/2011 Date of Discharge: 06/09/2011  Reason for Admission: Opiate dependence  Discharge Diagnoses: Principal Problem:  *Polysubstance dependence including opioid type drug, continuous use Active Problems:  Custody issue  Major depression  Opiate dependence  Benzodiazepine dependence  Back pain, chronic  Substance induced mood disorder   Axis Diagnosis:  AXIS I:  Opiate dependence, polysubstance dependence and abuse AXIS II:  Deferred AXIS III:   Past Medical History  Diagnosis Date  . Bulging disc   . Umbilical hernia   . Chronic back pain   . Fracture of sternum    AXIS IV:  other psychosocial or environmental problems, problems related to social environment and problems with primary support group AXIS V:  51-60 moderate symptoms  Level of Care:  OP  Hospital Course:  Jazzelle was admitted for detox from alcohol, cocaine, opiates, benzodiazepines and crisis management.  He/she was treated with the standard Librium and Clonodine protocol.  Medical problems were identified and treated.  Home medication was restarted as appropriate.     Improvement was monitored by CIWA/COWS scores and patient's daily report of withdrawal symptom reduction. Emotional and mental status was monitored by daily self inventory reports completed by the patient and clinical staff.      The patient was evaluated by the treatment team for stability and plans for continued recovery upon discharge. She was offered further treatment options upon discharge including Residential, IOP, and Outpatient treatment.  The patient's motivation was an integral factor for scheduling further treatment.  Employment, transportation, bed availability, health status, family  support, and any pending legal issues were also considered. The patient elected to pursue an outpatient program.    Upon completion of detox the patient was both mentally and medically stable for discharge.    Consults:  None  Significant Diagnostic Studies:  None  Discharge Vitals:   Blood pressure 103/61, pulse 94, temperature 97.8 F (36.6 C), temperature source Oral, resp. rate 24, height 5\' 5"  (1.651 m), weight 60.782 kg (134 lb), last menstrual period 04/28/2011, SpO2 97.00%.  Mental Status Exam: See Mental Status Examination and Suicide Risk Assessment completed by Attending Physician prior to discharge.  Discharge destination:  Home Is patient on multiple antipsychotic therapies at discharge:  No   Has Patient had three or more failed trials of antipsychotic monotherapy by history:  No Recommended Plan for Multiple Antipsychotic Therapies: not applicable    Medication List  As of 06/09/2011  1:02 PM   STOP taking these medications         diazepam 5 MG tablet      oxycodone 30 MG immediate release tablet      oxyCODONE-acetaminophen 7.5-325 MG per tablet         TAKE these medications      Indication    celecoxib 200 MG capsule   Commonly known as: CELEBREX   Take 1 capsule (200 mg total) by mouth daily. For arthritic pain       gabapentin 300 MG capsule   Commonly known as: NEURONTIN   Take 1 capsule (300 mg total) by mouth 2 (two) times daily. For anxiety       methocarbamol 500 MG tablet   Commonly known as: ROBAXIN   Take 1  tablet (500 mg total) by mouth every 8 (eight) hours as needed. For muscle spasm            Follow-up Information    Follow up with [facility] of the [place] on 06/10/2011. (Walk in for a screening on Wed or Thurs at 8 AM  You will be assigned a therapist after that initial screening)    Contact information:   [address]  Rafael Gonzalez  [phone]         Follow-up recommendations:  See above Comments:     Signed: Lloyd Huger T. Adlean Hardeman PAC For Dr. Harvie Heck Readling 06/09/2011, 1:02 PM

## 2011-06-09 NOTE — Progress Notes (Signed)
Pt d/c from hospital. All items returned. D/C instructions given, prescriptions given and samples given. Pt denies si and hi.  

## 2011-06-09 NOTE — Progress Notes (Signed)
Pt has been up groups today and has interacted with her peers and staff appropriately.  She denied any depression, hopelessness or anxiety on her self-inventory today.  She denied any symptoms of withdrawal but has complained of chronic back pain.  She was started on celebrex and neurontin to help with her pain.  She wanted discharged today but it was cancelled to ensure she met all DSS requirements of her detox.  She now plan to go to IOP and get counseling at [facility] of the [place].  She had prn robaxin at 0802 that she claimed gave little relief.  She is planning to discharge in the morning.

## 2011-06-09 NOTE — Progress Notes (Signed)
[facility] Group Notes:  (Counselor/Nursing/MHT/Case Management/Adjunct)  06/09/2011 12:40 PM  Type of Therapy:  Group Therapy at 11:00  Participation Level:  Minimal  Participation Quality:  Attentive while present  Affect:  Appropriate  Cognitive:  Appropriate  Insight:  Limited  Engagement in Group:  Limited  Engagement in Therapy:  Limited  Modes of Intervention:  Clarification, Socialization and Support  Summary of Progress/Problems:  Patient did not contribute to discussion on feelings related to diagnosis yet did participate in closing.  She shared one of the things she used to enjoy doing and is no longer able to enjoy doing due to her diagnosis is drive her car.  Patient was in and out of group room several times.    Clide Dales 06/09/2011, 12:43 PM

## 2011-06-12 NOTE — Progress Notes (Signed)
Patient Discharge Instructions:  Psychiatric Admission Assessment Note Provided,  06/12/2011 After Visit Summary (AVS) Provided,  06/12/2011 Face Sheet Provided, 06/12/2011 Faxed/Sent to the Next Level Care provider:  06/12/2011 Provided Suicide Risk Assessment - Discharge Assessment 06/12/2011  Faxed to [facility] services of the [facility] @ [phone]  Wandra Scot, 06/12/2011, 5:28 PM

## 2011-10-23 ENCOUNTER — Encounter (HOSPITAL_COMMUNITY): Payer: Self-pay | Admitting: Adult Health

## 2011-10-23 DIAGNOSIS — K439 Ventral hernia without obstruction or gangrene: Secondary | ICD-10-CM | POA: Insufficient documentation

## 2011-10-23 DIAGNOSIS — R109 Unspecified abdominal pain: Secondary | ICD-10-CM | POA: Insufficient documentation

## 2011-10-23 NOTE — ED Notes (Signed)
While at work at 8 pm tonight pt picked up a heavy box and then began cramping around the umbilicus, hx of umbilical hernia. Pt states, "it has been strangled before and that is what is feels like" associated with nausea and vomiting.

## 2011-10-24 ENCOUNTER — Emergency Department (HOSPITAL_COMMUNITY)
Admission: EM | Admit: 2011-10-24 | Discharge: 2011-10-24 | Disposition: A | Discharge status: Home / Self Care | Payer: Self-pay | Attending: Emergency Medicine | Admitting: Emergency Medicine

## 2011-10-24 ENCOUNTER — Emergency Department (HOSPITAL_COMMUNITY): Payer: Self-pay

## 2011-10-24 DIAGNOSIS — K439 Ventral hernia without obstruction or gangrene: Secondary | ICD-10-CM

## 2011-10-24 DIAGNOSIS — R109 Unspecified abdominal pain: Secondary | ICD-10-CM

## 2011-10-24 LAB — CBC WITH DIFFERENTIAL/PLATELET
HCT: 42.4 % (ref 36.0–46.0)
Hemoglobin: 14.1 g/dL (ref 12.0–15.0)
Lymphocytes Relative: 28 % (ref 12–46)
Lymphs Abs: 2.9 10*3/uL (ref 0.7–4.0)
Monocytes Relative: 6 % (ref 3–12)
Neutro Abs: 6.4 10*3/uL (ref 1.7–7.7)
Neutrophils Relative %: 62 % (ref 43–77)
RBC: 4.72 MIL/uL (ref 3.87–5.11)

## 2011-10-24 LAB — URINALYSIS, ROUTINE W REFLEX MICROSCOPIC
Glucose, UA: NEGATIVE mg/dL
Leukocytes, UA: NEGATIVE
Protein, ur: NEGATIVE mg/dL
Specific Gravity, Urine: 1.03 (ref 1.005–1.030)
pH: 5 (ref 5.0–8.0)

## 2011-10-24 LAB — BASIC METABOLIC PANEL
BUN: 15 mg/dL (ref 6–23)
CO2: 24 mEq/L (ref 19–32)
Chloride: 104 mEq/L (ref 96–112)
GFR calc Af Amer: >90 mL/min (ref >90)
Glucose, Bld: 90 mg/dL (ref 70–99)
Potassium: 3.6 mEq/L (ref 3.5–5.1)

## 2011-10-24 LAB — POCT PREGNANCY, URINE: Preg Test, Ur: NEGATIVE

## 2011-10-24 MED ORDER — IOHEXOL 300 MG/ML  SOLN
20.0000 mL | INTRAMUSCULAR | Status: AC
Start: 2011-10-24 — End: 2011-10-24
  Administered 2011-10-24: 20 mL via ORAL

## 2011-10-24 MED ORDER — SODIUM CHLORIDE 0.9 % IV SOLN
Freq: Once | INTRAVENOUS | Status: AC
  Administered 2011-10-24: 02:00:00 via INTRAVENOUS

## 2011-10-24 MED ORDER — ONDANSETRON HCL 4 MG/2ML IJ SOLN
4.0000 mg | Freq: Once | INTRAMUSCULAR | Status: AC
  Administered 2011-10-24: 4 mg via INTRAVENOUS
  Filled 2011-10-24: qty 2

## 2011-10-24 MED ORDER — MORPHINE SULFATE 4 MG/ML IJ SOLN
2.0000 mg | Freq: Once | INTRAMUSCULAR | Status: AC
  Administered 2011-10-24: 2 mg via INTRAVENOUS
  Filled 2011-10-24: qty 1

## 2011-10-24 MED ORDER — IOHEXOL 300 MG/ML  SOLN
100.0000 mL | Freq: Once | INTRAMUSCULAR | Status: AC | PRN
  Administered 2011-10-24: 100 mL via INTRAVENOUS

## 2011-10-24 MED ORDER — NAPROXEN 500 MG PO TABS: 500.0000 mg | ORAL_TABLET | Freq: Two times a day (BID) | ORAL | Status: AC

## 2011-10-24 NOTE — ED Provider Notes (Signed)
History     CSN: [id number]  Arrival date & time 10/23/11  2256   First MD Initiated Contact with Patient 10/24/11 0200      Chief Complaint  Patient presents with  . Abdominal Pain    (Consider location/radiation/quality/duration/timing/severity/associated sxs/prior treatment) HPI Comments: 26 year old female with a history of an umbilical hernia which requires intermittent reduction manually. She states that she was lifting a heavy box at work this evening when she felt acute onset of periumbilical pain with an associated protruding mass consistent with prior hernia. This pain was persistent, gradually improved and self reduced though she has had persistent pain at the site since with episodic nausea. She denies fevers, vomiting, cough, shortness of breath, rashes. There is still significant tenderness and pain when pushing over the umbilicus.  Patient is a 26 y.o. female presenting with abdominal pain. The history is provided by the patient and a friend.  Abdominal Pain The primary symptoms of the illness include abdominal pain.    Past Medical History  Diagnosis Date  . Bulging disc   . Umbilical hernia   . Chronic back pain   . Fracture of sternum     Past Surgical History  Procedure Date  . Tubal ligation   . Knee surgery     History reviewed. No pertinent family history.  History  Substance Use Topics  . Smoking status: Current Everyday Smoker  . Smokeless tobacco: Not on file  . Alcohol Use: Yes    OB History    Grav Para Term Preterm Abortions TAB SAB Ect Mult Living                  Review of Systems  Gastrointestinal: Positive for abdominal pain.  All other systems reviewed and are negative.    Allergies  Review of patient's allergies indicates no known allergies.  Home Medications   Current Outpatient Rx  Name Route Sig Dispense Refill  . NAPROXEN 500 MG PO TABS Oral Take 1 tablet (500 mg total) by mouth 2 (two) times daily with a meal. 30  tablet 0    BP 95/57  Pulse 62  Temp 98.5 F (36.9 C) (Oral)  Resp 16  SpO2 98%  LMP 10/03/2011  Physical Exam  Nursing note and vitals reviewed. Constitutional: She appears well-developed and well-nourished. No distress.  HENT:  Head: Normocephalic and atraumatic.  Mouth/Throat: Oropharynx is clear and moist. No oropharyngeal exudate.  Eyes: Conjunctivae and EOM are normal. Pupils are equal, round, and reactive to light. Right eye exhibits no discharge. Left eye exhibits no discharge. No scleral icterus.  Neck: Normal range of motion. Neck supple. No JVD present. No thyromegaly present.  Cardiovascular: Normal rate, regular rhythm, normal heart sounds and intact distal pulses.  Exam reveals no gallop and no friction rub.   No murmur heard. Pulmonary/Chest: Effort normal and breath sounds normal. No respiratory distress. She has no wheezes. She has no rales.  Abdominal: Soft. Bowel sounds are normal. She exhibits no distension and no mass. There is tenderness ( Focal tenderness in the periumbilical region with defect in the underlying fascia felt, no hernia is extruded from the site at this time, there is guarding. There is no pain at the right lower quadrant, no peritoneal signs).  Musculoskeletal: Normal range of motion. She exhibits no edema and no tenderness.  Lymphadenopathy:    She has no cervical adenopathy.  Neurological: She is alert. Coordination normal.  Skin: Skin is warm and dry. No rash noted.  No erythema.  Psychiatric: She has a normal mood and affect. Her behavior is normal.    ED Course  Procedures (including critical care time)  Labs Reviewed  URINALYSIS, ROUTINE W REFLEX MICROSCOPIC - Abnormal; Notable for the following:    APPearance CLOUDY (*)     Bilirubin Urine SMALL (*)     Ketones, ur 15 (*)     All other components within normal limits  CBC WITH DIFFERENTIAL  BASIC METABOLIC PANEL  POCT PREGNANCY, URINE   Ct Abdomen Pelvis W Contrast  10/24/2011   *RADIOLOGY REPORT*  Clinical Data: Umbilical hernia.  CT ABDOMEN AND PELVIS WITH CONTRAST  Technique:  Multidetector CT imaging of the abdomen and pelvis was performed following the standard protocol during bolus administration of intravenous contrast.  Contrast: OMNIPAQUE IOHEXOL 300 MG/ML  SOLN  Comparison: 05/24/11  Findings: Lung bases appear clear.  No pericardial or pleural effusion.  No suspicious liver abnormality.  The gallbladder appears normal. The common bile duct has a normal caliber.  The pancreas is unremarkable.  Normal appearance of the spleen.  Both adrenal glands appear normal.  Stone is noted within the inferior pole of the left kidney measuring 2.9 mm.  Cyst is noted within the upper pole of the right kidney.  The urinary bladder appears normal.  Uterus and the adnexal structures have a normal physiologic appearance for patient's age.  Prior bilateral tubal ligation.  No upper abdominal adenopathy.  There is no pelvic or inguinal adenopathy.  The stomach appears normal.  The small bowel loops have a normal course and caliber without evidence for obstruction. The appendix is identified and appears normal.  The colon is normal.  No significant free fluid or fluid collections within the abdomen or pelvis.  There is a periumbilical hernia identified which contains fat only.  This appears similar to the previous exam. Review of the visualized osseous structures is significant for left- sided L4 pars defect.  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  IMPRESSION:  1. There is a small periumbilical hernia which contains fat only.  2.  No bowel containing hernia identified at this time.  No acute intra abdominal or pelvic CT findings noted. 3.  L4 and L5 pars defects.   Original Report Authenticated By: Rosealee Albee, M.D.      1. Abdominal pain   2. Abdominal wall hernia       MDM  The patient appears mildly uncomfortable, she has reproducible pain to palpation in the periumbilical region  with a possible incarcerated hernia, CT scan pending to rule out.    Patient informed of CT scan which shows no signs of bowel containing hernia, she appears stable for discharge.    Vida Roller, MD 10/24/11 [phone]

## 2014-08-13 DIAGNOSIS — G8929 Other chronic pain: Secondary | ICD-10-CM | POA: Diagnosis not present

## 2014-08-13 DIAGNOSIS — Y9389 Activity, other specified: Secondary | ICD-10-CM | POA: Insufficient documentation

## 2014-08-13 DIAGNOSIS — Z3202 Encounter for pregnancy test, result negative: Secondary | ICD-10-CM | POA: Diagnosis not present

## 2014-08-13 DIAGNOSIS — Z8739 Personal history of other diseases of the musculoskeletal system and connective tissue: Secondary | ICD-10-CM | POA: Insufficient documentation

## 2014-08-13 DIAGNOSIS — Z8781 Personal history of (healed) traumatic fracture: Secondary | ICD-10-CM | POA: Diagnosis not present

## 2014-08-13 DIAGNOSIS — Z720 Tobacco use: Secondary | ICD-10-CM | POA: Insufficient documentation

## 2014-08-13 DIAGNOSIS — Z8719 Personal history of other diseases of the digestive system: Secondary | ICD-10-CM | POA: Diagnosis not present

## 2014-08-13 DIAGNOSIS — F10129 Alcohol abuse with intoxication, unspecified: Secondary | ICD-10-CM | POA: Diagnosis not present

## 2014-08-13 DIAGNOSIS — X58XXXA Exposure to other specified factors, initial encounter: Secondary | ICD-10-CM | POA: Diagnosis not present

## 2014-08-13 DIAGNOSIS — S3992XA Unspecified injury of lower back, initial encounter: Secondary | ICD-10-CM | POA: Diagnosis present

## 2014-08-13 DIAGNOSIS — Y998 Other external cause status: Secondary | ICD-10-CM | POA: Insufficient documentation

## 2014-08-13 DIAGNOSIS — Y9289 Other specified places as the place of occurrence of the external cause: Secondary | ICD-10-CM | POA: Insufficient documentation

## 2014-08-13 LAB — PREGNANCY, URINE: PREG TEST UR: NEGATIVE

## 2014-08-13 NOTE — ED Notes (Signed)
Pt. Reports moving furniture and feeling a pop in her low back with pain now.  Pt. Reports feeling pressure in her hips and her low back.  Pt.able to walk and moves slowly.  No reports of trouble peeing.

## 2014-08-14 ENCOUNTER — Encounter (HOSPITAL_BASED_OUTPATIENT_CLINIC_OR_DEPARTMENT_OTHER): Payer: Self-pay | Admitting: Emergency Medicine

## 2014-08-14 ENCOUNTER — Emergency Department (HOSPITAL_BASED_OUTPATIENT_CLINIC_OR_DEPARTMENT_OTHER)
Admission: EM | Admit: 2014-08-14 | Discharge: 2014-08-14 | Disposition: A | Discharge status: Home / Self Care | Payer: Medicaid Other | Attending: Emergency Medicine | Admitting: Emergency Medicine

## 2014-08-14 ENCOUNTER — Emergency Department (HOSPITAL_BASED_OUTPATIENT_CLINIC_OR_DEPARTMENT_OTHER): Payer: Medicaid Other

## 2014-08-14 DIAGNOSIS — M62830 Muscle spasm of back: Secondary | ICD-10-CM

## 2014-08-14 DIAGNOSIS — R52 Pain, unspecified: Secondary | ICD-10-CM

## 2014-08-14 MED ORDER — METHOCARBAMOL 500 MG PO TABS: 500.0000 mg | ORAL_TABLET | Freq: Three times a day (TID) | ORAL | Status: DC

## 2014-08-14 MED ORDER — LIDOCAINE 5 % EX PTCH: 1.0000 | MEDICATED_PATCH | CUTANEOUS | Status: DC

## 2014-08-14 MED ORDER — MELOXICAM 7.5 MG PO TABS: 7.5000 mg | ORAL_TABLET | Freq: Every day | ORAL | Status: DC

## 2014-08-14 MED ORDER — DEXAMETHASONE SODIUM PHOSPHATE 10 MG/ML IJ SOLN
10.0000 mg | Freq: Once | INTRAMUSCULAR | Status: AC
  Administered 2014-08-14: 10 mg via INTRAMUSCULAR
  Filled 2014-08-14: qty 1

## 2014-08-14 MED ORDER — KETOROLAC TROMETHAMINE 60 MG/2ML IM SOLN
60.0000 mg | Freq: Once | INTRAMUSCULAR | Status: AC
  Administered 2014-08-14: 60 mg via INTRAMUSCULAR
  Filled 2014-08-14: qty 2

## 2014-08-14 MED ORDER — METHOCARBAMOL 500 MG PO TABS
1000.0000 mg | ORAL_TABLET | Freq: Once | ORAL | Status: AC
  Administered 2014-08-14: 1000 mg via ORAL
  Filled 2014-08-14: qty 2

## 2014-08-14 MED ORDER — OXYCODONE-ACETAMINOPHEN 5-325 MG PO TABS
1.0000 | ORAL_TABLET | Freq: Once | ORAL | Status: AC
  Administered 2014-08-14: 1 via ORAL
  Filled 2014-08-14: qty 1

## 2014-08-14 NOTE — ED Provider Notes (Addendum)
CSN: [id number]     Arrival date & time 08/13/14  2321 History   This chart was scribed for Russell Engelstad, MD by Octavia Heir, ED Scribe. This patient was seen in room MH12/MH12 and the patient's care was started at 12:24 AM.    Chief Complaint  Patient presents with  . Back Pain      Patient is a 29 y.o. female presenting with back pain. The history is provided by the patient. No language interpreter was used.  Back Pain Location:  Sacro-iliac joint Quality:  Aching Radiates to:  Does not radiate Pain severity:  Severe Pain is:  Same all the time Onset quality:  Sudden Timing:  Constant Progression:  Unchanged Chronicity:  Chronic (acute on chronic) Context: lifting heavy objects   Relieved by:  Nothing Worsened by:  Nothing tried Ineffective treatments:  Ibuprofen Associated symptoms: no abdominal pain, no abdominal swelling, no bladder incontinence, no bowel incontinence, no chest pain, no dysuria, no fever, no headaches, no leg pain, no numbness, no paresthesias, no pelvic pain, no perianal numbness, no tingling, no weakness and no weight loss   Risk factors: no hx of cancer     HPI Comments: Alejandra Park is a 29 y.o. 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 Pt notes she felt a "separation". Pt has been taking OTC ibuprofen and tylenol to alleviate the pain with no relief.  Pt notes having back issues with her L5/L6.   Past Medical History  Diagnosis Date  . Bulging disc   . Umbilical hernia   . Chronic back pain   . Fracture of sternum    Past Surgical History  Procedure Laterality Date  . Tubal ligation    . Knee surgery     No family history on file. History  Substance Use Topics  . Smoking status: Current Every Day Smoker  . Smokeless tobacco: Not on file  .  Alcohol Use: Yes   OB History    No data available     Review of Systems  Constitutional: Negative for fever and weight loss.  Cardiovascular: Negative for chest pain.  Gastrointestinal: Negative for abdominal pain and bowel incontinence.  Genitourinary: Negative for bladder incontinence, dysuria, difficulty urinating and pelvic pain.  Musculoskeletal: Positive for back pain.  Neurological: Negative for tingling, weakness, numbness, headaches and paresthesias.  All other systems reviewed and are negative.     Allergies  Review of patient's allergies indicates no known allergies.  Home Medications   Prior to Admission medications   Not on File   Triage vitals: BP 103/45 mmHg  Pulse 83  Temp(Src) 98.2 F (36.8 C) (Oral)  Resp 18  Ht 5\' 6"  (1.676 m)  Wt 150 lb (68.04 kg)  BMI 24.22 kg/m2  SpO2 98%  LMP 07/18/2014 Physical Exam  Constitutional: She is oriented to person, place, and time. She appears well-developed and well-nourished. No distress.  Patient appears intoxicated  HENT:  Head: Normocephalic.  Mouth/Throat: Oropharynx is clear and moist. No oropharyngeal exudate.  Eyes: Conjunctivae and EOM are normal. Pupils are equal, round, and reactive to light. No scleral icterus.  Pupils dilated in light  Neck: Normal range of motion. Neck supple. No tracheal deviation present. No thyromegaly present.  Trachea midline  Cardiovascular: Normal rate and regular rhythm.  Exam reveals no gallop and no friction rub.   No murmur heard. Pulmonary/Chest: Effort normal and breath sounds normal. No respiratory distress. She has no wheezes. She has no rales.  Abdominal: Soft. Bowel sounds are normal. She exhibits no distension. There is no tenderness. There is no rebound and no guarding.  Musculoskeletal: Normal range of motion.  No step offs, no crepitus, nor point tenderness in c,t or l spine  Neurological: She is alert and oriented to person, place, and time. She has normal  reflexes. She displays normal reflexes. She exhibits normal muscle tone. Coordination normal.  Intact gait, intact l5/s1 intact perineal sensation  Skin: Skin is warm and dry. No rash noted.  Psychiatric: She has a normal mood and affect. Her behavior is normal.  Nursing note and vitals reviewed.   ED Course  Procedures  DIAGNOSTIC STUDIES: Oxygen Saturation is 98% on RA, normal by my interpretation.  COORDINATION OF CARE:  12:28 AM Discussed treatment plan which includes dg lumbar spine and muscle relaxer with pt at bedside and pt agreed to plan.  Labs Review Labs Reviewed  PREGNANCY, URINE    Imaging Review No results found.   EKG Interpretation None      MDM   Final diagnoses:  None    Given chronic pain and ongoing narcotic abuse will place on NSAID and muscle relaxant.  There is nothing acute on imaging.  Exam and vitals are benign and reassuring.  Patient is at high risk for overdose and as the patient has chronic pain there is no indication for narcotics.  Follow up with PMD.  Strict return precautions given  I personally performed the services described in this documentation, which was scribed in my presence. The recorded information has been reviewed and is accurate.    Alejandra Blamer, MD 08/14/14 0110  Alejandra Pickney, MD 08/14/14 [phone]

## 2014-10-08 ENCOUNTER — Other Ambulatory Visit: Payer: Self-pay | Admitting: Neurosurgery

## 2014-10-08 DIAGNOSIS — M5416 Radiculopathy, lumbar region: Secondary | ICD-10-CM

## 2014-12-08 ENCOUNTER — Emergency Department (HOSPITAL_COMMUNITY)
Admission: EM | Admit: 2014-12-08 | Discharge: 2014-12-08 | Disposition: A | Discharge status: Home / Self Care | Payer: Medicaid Other | Attending: Emergency Medicine | Admitting: Emergency Medicine

## 2014-12-08 ENCOUNTER — Encounter (HOSPITAL_COMMUNITY): Payer: Self-pay | Admitting: Vascular Surgery

## 2014-12-08 DIAGNOSIS — Z8781 Personal history of (healed) traumatic fracture: Secondary | ICD-10-CM | POA: Diagnosis not present

## 2014-12-08 DIAGNOSIS — Z791 Long term (current) use of non-steroidal anti-inflammatories (NSAID): Secondary | ICD-10-CM | POA: Insufficient documentation

## 2014-12-08 DIAGNOSIS — Z720 Tobacco use: Secondary | ICD-10-CM | POA: Insufficient documentation

## 2014-12-08 DIAGNOSIS — Z79899 Other long term (current) drug therapy: Secondary | ICD-10-CM | POA: Diagnosis not present

## 2014-12-08 DIAGNOSIS — G8929 Other chronic pain: Secondary | ICD-10-CM | POA: Insufficient documentation

## 2014-12-08 DIAGNOSIS — L0291 Cutaneous abscess, unspecified: Secondary | ICD-10-CM

## 2014-12-08 DIAGNOSIS — L02512 Cutaneous abscess of left hand: Secondary | ICD-10-CM | POA: Diagnosis present

## 2014-12-08 DIAGNOSIS — Z8739 Personal history of other diseases of the musculoskeletal system and connective tissue: Secondary | ICD-10-CM | POA: Diagnosis not present

## 2014-12-08 MED ORDER — SULFAMETHOXAZOLE-TRIMETHOPRIM 800-160 MG PO TABS: 1.0000 | ORAL_TABLET | Freq: Two times a day (BID) | ORAL | Status: AC

## 2014-12-08 MED ORDER — LIDOCAINE-EPINEPHRINE (PF) 2 %-1:200000 IJ SOLN
10.0000 mL | Freq: Once | INTRAMUSCULAR | Status: AC
  Administered 2014-12-08: 10 mL
  Filled 2014-12-08: qty 20

## 2014-12-08 MED ORDER — CEPHALEXIN 500 MG PO CAPS: 500.0000 mg | ORAL_CAPSULE | Freq: Four times a day (QID) | ORAL | Status: DC

## 2014-12-08 NOTE — Discharge Instructions (Signed)

## 2014-12-08 NOTE — ED Notes (Signed)
Pt reports to the ED for eval of abscess to left wrist area. It developed 3 days ago after she injected heroin into her arm. Denies any fevers or chills. No streaking or erythema extending up her arm. Pt A&Ox4, resp e/u, and skin warm and dry.

## 2014-12-08 NOTE — ED Provider Notes (Signed)
CSN: [id number]     Arrival date & time 12/08/14  1129 History   First MD Initiated Contact with Patient 12/08/14 1259     Chief Complaint  Patient presents with  . Abscess     (Consider location/radiation/quality/duration/timing/severity/associated sxs/prior Treatment) HPI Comments: Patient presents emergency department with chief complaints of abscess. She states that she developed an abscess to her left wrist. She states that it started approximately 3 days ago. 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 She denies any associated fevers, chills, nausea, or vomiting. She states that it has been gradually worsening. It is painful to palpation. She has not tried taking anything for her symptoms.  The history is provided by the patient. No language interpreter was used.    Past Medical History  Diagnosis Date  . Bulging disc   . Umbilical hernia   . Chronic back pain   . Fracture of sternum    Past Surgical History  Procedure Laterality Date  . Tubal ligation    . Knee surgery     No family history on file. Social History  Substance Use Topics  . Smoking status: Current Every Day Smoker  . Smokeless tobacco: None  . Alcohol Use: Yes   OB History    No data available     Review of Systems  Constitutional: Negative for fever and chills.  Respiratory: Negative for shortness of breath.   Cardiovascular: Negative for chest pain.  Gastrointestinal: Negative for nausea, vomiting, diarrhea and constipation.  Genitourinary: Negative for dysuria.  Skin: Positive for color change and wound.  All other systems reviewed and are negative.     Allergies  Review of patient's allergies indicates no known allergies.  Home Medications   Prior to Admission medications   Medication Sig Start Date End Date Taking? Authorizing Provider  lidocaine (LIDODERM) 5 % Place 1 patch onto the skin daily. Remove & Discard patch within 12 hours or as directed by MD 08/14/14    April Palumbo, MD  meloxicam (MOBIC) 7.5 MG tablet Take 1 tablet (7.5 mg total) by mouth daily. 08/14/14   April Palumbo, MD  methocarbamol (ROBAXIN) 500 MG tablet Take 1 tablet (500 mg total) by mouth 3 (three) times daily. 08/14/14   April Palumbo, MD   BP 109/57 mmHg  Pulse 83  Temp(Src) 98.6 F (37 C) (Oral)  Resp 18  Ht  (1.676 m)  Wt 140 lb 8 oz (63.73 kg)  BMI 22.69 kg/m2  SpO2 96% Physical Exam  Constitutional: She is oriented to person, place, and time. She appears well-developed and well-nourished.  HENT:  Head: Normocephalic and atraumatic.  Eyes: Conjunctivae and EOM are normal.  Neck: Normal range of motion.  Cardiovascular: Normal rate.   Pulmonary/Chest: Effort normal.  Abdominal: She exhibits no distension.  Musculoskeletal: Normal range of motion.  Range of motion of wrist is 5/5, no evidence of septic arthritis  Neurological: She is alert and oriented to person, place, and time.  Skin: Skin is dry.  2 x 3 cm abscess to the distal left forearm, mild surrounding erythema/cellulitis  Psychiatric: She has a normal mood and affect. Her behavior is normal. Judgment and thought content normal.  Nursing note and vitals reviewed.   ED Course  Procedures (including critical care time) INCISION AND DRAINAGE Performed by: Roxy Horseman Consent: Verbal consent obtained. Risks and benefits: risks, benefits and alternatives were discussed Type: abscess  Body area: left distal forearm  Anesthesia: local infiltration  Incision was made with a scalpel.  Local anesthetic: lidocaine 2% with epinephrine  Anesthetic total: 3 ml  Complexity: complex Blunt dissection to break up loculations  Drainage: purulent  Drainage amount: moderate  Packing material: not packed  Patient tolerance: Patient tolerated the procedure well with no immediate complications.     MDM   Final diagnoses:  Abscess    Patient with abscess near wrist, no evidence of septic  arthritis. We will incise and drain in the emergency department. Patient will require antibiotic therapy for surrounding cellulitis. She is encouraged to follow-up with behavioral health and to cease using IV drugs.  Patient instructed to f/u with hand in 2-3 days for wound check, or to return here if unable to see hand in 2-3 for wound check.    Roxy Horsemanobert Cedrica Brune, PA-C 12/08/14 1336  Bethann BerkshireJoseph Zammit, MD 12/09/14 1228

## 2015-01-14 ENCOUNTER — Encounter (HOSPITAL_COMMUNITY): Payer: Self-pay | Admitting: Emergency Medicine

## 2015-01-14 ENCOUNTER — Emergency Department (HOSPITAL_COMMUNITY)
Admission: EM | Admit: 2015-01-14 | Discharge: 2015-01-15 | Disposition: A | Discharge status: Home / Self Care | Payer: Medicaid Other | Attending: Emergency Medicine | Admitting: Emergency Medicine

## 2015-01-14 DIAGNOSIS — Z791 Long term (current) use of non-steroidal anti-inflammatories (NSAID): Secondary | ICD-10-CM | POA: Insufficient documentation

## 2015-01-14 DIAGNOSIS — G8929 Other chronic pain: Secondary | ICD-10-CM | POA: Insufficient documentation

## 2015-01-14 DIAGNOSIS — K644 Residual hemorrhoidal skin tags: Secondary | ICD-10-CM | POA: Diagnosis not present

## 2015-01-14 DIAGNOSIS — Z8781 Personal history of (healed) traumatic fracture: Secondary | ICD-10-CM | POA: Diagnosis not present

## 2015-01-14 DIAGNOSIS — K6289 Other specified diseases of anus and rectum: Secondary | ICD-10-CM | POA: Diagnosis present

## 2015-01-14 DIAGNOSIS — F17210 Nicotine dependence, cigarettes, uncomplicated: Secondary | ICD-10-CM | POA: Diagnosis not present

## 2015-01-14 DIAGNOSIS — K645 Perianal venous thrombosis: Secondary | ICD-10-CM

## 2015-01-14 LAB — CBC WITH DIFFERENTIAL/PLATELET
BASOS ABS: 0 10*3/uL (ref 0.0–0.1)
Basophils Relative: 0 %
Eosinophils Absolute: 0.3 10*3/uL (ref 0.0–0.7)
Eosinophils Relative: 4 %
HEMATOCRIT: 40.3 % (ref 36.0–46.0)
HEMOGLOBIN: 13 g/dL (ref 12.0–15.0)
LYMPHS ABS: 3.6 10*3/uL (ref 0.7–4.0)
LYMPHS PCT: 49 %
MCH: 28.5 pg (ref 26.0–34.0)
MCHC: 32.3 g/dL (ref 30.0–36.0)
MCV: 88.4 fL (ref 78.0–100.0)
Monocytes Absolute: 0.5 10*3/uL (ref 0.1–1.0)
Monocytes Relative: 6 %
NEUTROS ABS: 3.1 10*3/uL (ref 1.7–7.7)
NEUTROS PCT: 41 %
PLATELETS: 279 10*3/uL (ref 150–400)
RBC: 4.56 MIL/uL (ref 3.87–5.11)
RDW: 14.5 % (ref 11.5–15.5)
WBC: 7.4 10*3/uL (ref 4.0–10.5)

## 2015-01-14 LAB — BASIC METABOLIC PANEL
ANION GAP: 6 (ref 5–15)
BUN: 10 mg/dL (ref 6–20)
CHLORIDE: 105 mmol/L (ref 101–111)
CO2: 26 mmol/L (ref 22–32)
Calcium: 9 mg/dL (ref 8.9–10.3)
Creatinine, Ser: 0.71 mg/dL (ref 0.44–1.00)
Glucose, Bld: 96 mg/dL (ref 65–99)
Potassium: 4.4 mmol/L (ref 3.5–5.1)
Sodium: 137 mmol/L (ref 135–145)

## 2015-01-14 MED ORDER — OXYCODONE-ACETAMINOPHEN 5-325 MG PO TABS
ORAL_TABLET | ORAL | Status: AC
  Filled 2015-01-14: qty 1

## 2015-01-14 MED ORDER — OXYCODONE-ACETAMINOPHEN 5-325 MG PO TABS
1.0000 | ORAL_TABLET | Freq: Once | ORAL | Status: AC
  Administered 2015-01-14: 1 via ORAL

## 2015-01-14 NOTE — ED Notes (Signed)
Pt. reports hemorrhoid pain with swelling onset yesterday morning , denies bleeding .

## 2015-01-14 NOTE — ED Provider Notes (Signed)
CSN: [id number]   Arrival date & time 01/14/15 2110  History  By signing my name below, I, Bethel BornBritney McCollum, attest that this documentation has been prepared under the direction and in the presence of Gilda Creasehristopher J Pollina, MD. Electronically Signed: Bethel BornBritney McCollum, ED Scribe. 01/14/2015. 11:44 PM.  Chief Complaint  Patient presents with  . Hemorrhoids    HPI The history is provided by the patient. No language interpreter was used.   Alejandra Park is a 29 y.o. female who presents to the Emergency Department complaining of increased, constant, 8/10 hemorrhoid pain with onset this morning. Pt states that she has had intermittent issues with hemorrhoids since giving birth but they are usually soft and able to be pushed back in. Today they are more painful than usual, hard, and unable to be reduced. Pt denies rectal bleeding.   Past Medical History  Diagnosis Date  . Bulging disc   . Umbilical hernia   . Chronic back pain   . Fracture of sternum     Past Surgical History  Procedure Laterality Date  . Tubal ligation    . Knee surgery      No family history on file.  Social History  Substance Use Topics  . Smoking status: Current Every Day Smoker  . Smokeless tobacco: None  . Alcohol Use: Yes     Review of Systems  Gastrointestinal: Positive for rectal pain (painful hemorrhoids). Negative for blood in stool and anal bleeding.  All other systems reviewed and are negative.  Home Medications   Prior to Admission medications   Medication Sig Start Date End Date Taking? Authorizing Provider  cephALEXin (KEFLEX) 500 MG capsule Take 1 capsule (500 mg total) by mouth 4 (four) times daily. 12/08/14   Roxy Horsemanobert Browning, PA-C  lidocaine (LIDODERM) 5 % Place 1 patch onto the skin daily. Remove & Discard patch within 12 hours or as directed by MD 08/14/14   April Palumbo, MD  meloxicam (MOBIC) 7.5 MG tablet Take 1 tablet (7.5 mg total) by mouth daily. 08/14/14   April Palumbo, MD   methocarbamol (ROBAXIN) 500 MG tablet Take 1 tablet (500 mg total) by mouth 3 (three) times daily. 08/14/14   April Palumbo, MD    Allergies  Review of patient's allergies indicates no known allergies.  Triage Vitals: BP 102/55 mmHg  Pulse 100  Temp(Src) 97.9 F (36.6 C) (Oral)  Resp 16  Ht 5\' 6"  (1.676 m)  Wt 143 lb (64.864 kg)  BMI 23.09 kg/m2  SpO2 100%  LMP   Physical Exam  Constitutional: She is oriented to person, place, and time. She appears well-developed and well-nourished. No distress.  HENT:  Head: Normocephalic and atraumatic.  Right Ear: Hearing normal.  Left Ear: Hearing normal.  Nose: Nose normal.  Mouth/Throat: Oropharynx is clear and moist and mucous membranes are normal.  Eyes: Conjunctivae and EOM are normal. Pupils are equal, round, and reactive to light.  Neck: Normal range of motion. Neck supple.  Cardiovascular: Regular rhythm, S1 normal and S2 normal.  Exam reveals no gallop and no friction rub.   No murmur heard. Pulmonary/Chest: Effort normal and breath sounds normal. No respiratory distress. She exhibits no tenderness.  Abdominal: Soft. Normal appearance and bowel sounds are normal. There is no hepatosplenomegaly. There is no tenderness. There is no rebound, no guarding, no tenderness at McBurney's point and negative Murphy's sign. No hernia.  Musculoskeletal: Normal range of motion.  Neurological: She is alert and oriented to person, place, and time.  She has normal strength. No cranial nerve deficit or sensory deficit. Coordination normal. GCS eye subscore is 4. GCS verbal subscore is 5. GCS motor subscore is 6.  Skin: Skin is warm, dry and intact. No rash noted. No cyanosis.  Psychiatric: She has a normal mood and affect. Her speech is normal and behavior is normal. Thought content normal.  Nursing note and vitals reviewed.   ED Course  Procedures  INCISION AND DRAINAGE Performed by: Gilda Crease. Consent: Verbal consent  obtained. Risks and benefits: risks, benefits and alternatives were discussed Type: abscess  Body area: anal  Anesthesia: local infiltration  Incision was made with a scalpel.  Local anesthetic: lidocaine 2% with epinephrine  Anesthetic total: 3 ml  Complexity: complex Blunt dissection to evacuate clot  Drainage: clot  Patient tolerance: Patient tolerated the procedure well with no immediate complications.     DIAGNOSTIC STUDIES: Oxygen Saturation is 100% on RA, normal by my interpretation.    COORDINATION OF CARE: 11:40 PM Discussed treatment plan which includes lab work, pain management,  with pt at bedside and pt agreed to plan.  Labs Reviewed  BASIC METABOLIC PANEL  CBC WITH DIFFERENTIAL/PLATELET    Imaging Review No results found.  I personally reviewed and evaluated these images and lab results as a part of my medical decision-making.    MDM   Final diagnoses:  None  thrombosed hemorrhoid  Resents with rectal pain secondary to thrombosed hemorrhoid. Incision and drainage performed, analgesia, stool softeners, follow-up with general surgery.  I personally performed the services described in this documentation, which was scribed in my presence. The recorded information has been reviewed and is accurate.    Gilda Crease, MD 01/15/15 [phone]

## 2015-01-15 MED ORDER — HYDROCODONE-ACETAMINOPHEN 5-325 MG PO TABS: 2.0000 | ORAL_TABLET | ORAL | Status: DC | PRN

## 2015-01-15 MED ORDER — DOCUSATE SODIUM 100 MG PO CAPS: 100.0000 mg | ORAL_CAPSULE | Freq: Two times a day (BID) | ORAL | Status: DC

## 2015-01-15 MED ORDER — LIDOCAINE-EPINEPHRINE (PF) 2 %-1:200000 IJ SOLN
20.0000 mL | Freq: Once | INTRAMUSCULAR | Status: AC
  Administered 2015-01-15: 20 mL via INTRADERMAL
  Filled 2015-01-15: qty 20

## 2015-01-15 NOTE — Discharge Instructions (Signed)
Hemorrhoids °Hemorrhoids are swollen veins around the rectum or anus. There are two types of hemorrhoids:  °· Internal hemorrhoids. These occur in the veins just inside the rectum. They may poke through to the outside and become irritated and painful. °· External hemorrhoids. These occur in the veins outside the anus and can be felt as a painful swelling or hard lump near the anus. °CAUSES °· Pregnancy.   °· Obesity.   °· Constipation or diarrhea.   °· Straining to have a bowel movement.   °· Sitting for long periods on the toilet. °· Heavy lifting or other activity that caused you to strain. °· Anal intercourse. °SYMPTOMS  °· Pain.   °· Anal itching or irritation.   °· Rectal bleeding.   °· Fecal leakage.   °· Anal swelling.   °· One or more lumps around the anus.   °DIAGNOSIS  °Your caregiver may be able to diagnose hemorrhoids by visual examination. Other examinations or tests that may be performed include:  °· Examination of the rectal area with a gloved hand (digital rectal exam).   °· Examination of anal canal using a small tube (scope).   °· A blood test if you have lost a significant amount of blood. °· A test to look inside the colon (sigmoidoscopy or colonoscopy). °TREATMENT °Most hemorrhoids can be treated at home. However, if symptoms do not seem to be getting better or if you have a lot of rectal bleeding, your caregiver may perform a procedure to help make the hemorrhoids get smaller or remove them completely. Possible treatments include:  °· Placing a rubber band at the base of the hemorrhoid to cut off the circulation (rubber band ligation).   °· Injecting a chemical to shrink the hemorrhoid (sclerotherapy).   °· Using a tool to burn the hemorrhoid (infrared light therapy).   °· Surgically removing the hemorrhoid (hemorrhoidectomy).   °· Stapling the hemorrhoid to block blood flow to the tissue (hemorrhoid stapling).   °HOME CARE INSTRUCTIONS  °· Eat foods with fiber, such as whole grains, beans,  nuts, fruits, and vegetables. Ask your doctor about taking products with added fiber in them (fiber supplements). °· Increase fluid intake. Drink enough water and fluids to keep your urine clear or pale yellow.   °· Exercise regularly.   °· Go to the bathroom when you have the urge to have a bowel movement. Do not wait.   °· Avoid straining to have bowel movements.   °· Keep the anal area dry and clean. Use wet toilet paper or moist towelettes after a bowel movement.   °· Medicated creams and suppositories may be used or applied as directed.   °· Only take over-the-counter or prescription medicines as directed by your caregiver.   °· Take warm sitz baths for 15-20 minutes, 3-4 times a day to ease pain and discomfort.   °· Place ice packs on the hemorrhoids if they are tender and swollen. Using ice packs between sitz baths may be helpful.   °¨ Put ice in a plastic bag.   °¨ Place a towel between your skin and the bag.   °¨ Leave the ice on for 15-20 minutes, 3-4 times a day.   °· Do not use a donut-shaped pillow or sit on the toilet for long periods. This increases blood pooling and pain.   °SEEK MEDICAL CARE IF: °· You have increasing pain and swelling that is not controlled by treatment or medicine. °· You have uncontrolled bleeding. °· You have difficulty or you are unable to have a bowel movement. °· You have pain or inflammation outside the area of the hemorrhoids. °MAKE SURE YOU: °· Understand these instructions. °·   Will watch your condition. °· Will get help right away if you are not doing well or get worse. °  °This information is not intended to replace advice given to you by your health care provider. Make sure you discuss any questions you have with your health care provider. °  °Document Released: 02/07/2000 Document Revised: 01/27/2012 Document Reviewed: 12/15/2011 °Elsevier Interactive Patient Education ©2016 [employer] ° °High-Fiber Diet °Fiber, also called dietary fiber, is a type of carbohydrate  found in fruits, vegetables, whole grains, and beans. A high-fiber diet can have many health benefits. Your health care provider may recommend a high-fiber diet to help: °· Prevent constipation. Fiber can make your bowel movements more regular. °· Lower your cholesterol. °· Relieve hemorrhoids, uncomplicated diverticulosis, or irritable bowel syndrome. °· Prevent overeating as part of a weight-loss plan. °· Prevent heart disease, type 2 diabetes, and certain cancers. °WHAT IS MY PLAN? °The recommended daily intake of fiber includes: °· 38 grams for men under age 50. °· 30 grams for men over age 50. °· 25 grams for women under age 50. °· 21 grams for women over age 50. °You can get the recommended daily intake of dietary fiber by eating a variety of fruits, vegetables, grains, and beans. Your health care provider may also recommend a fiber supplement if it is not possible to get enough fiber through your diet. °WHAT DO I NEED TO KNOW ABOUT A HIGH-FIBER DIET? °· Fiber supplements have not been widely studied for their effectiveness, so it is better to get fiber through food sources. °· Always check the fiber content on the nutrition facts label of any prepackaged food. Look for foods that contain at least 5 grams of fiber per serving. °· Ask your dietitian if you have questions about specific foods that are related to your condition, especially if those foods are not listed in the following section. °· Increase your daily fiber consumption gradually. Increasing your intake of dietary fiber too quickly may cause bloating, cramping, or gas. °· Drink plenty of water. Water helps you to digest fiber. °WHAT FOODS CAN I EAT? °Grains °Whole-grain breads. Multigrain cereal. Oats and oatmeal. Brown rice. Barley. Bulgur wheat. Millet. Bran muffins. Popcorn. Rye wafer crackers. °Vegetables °Sweet potatoes. Spinach. Kale. Artichokes. Cabbage. Broccoli. Green peas. Carrots. Squash. °Fruits °Berries. Pears. Apples. Oranges.  Avocados. Prunes and raisins. Dried figs. °Meats and Other Protein Sources °Navy, kidney, pinto, and soy beans. Split peas. Lentils. Nuts and seeds. °Dairy °Fiber-fortified yogurt. °Beverages °Fiber-fortified soy milk. Fiber-fortified orange juice. °Other °Fiber bars. °The items listed above may not be a complete list of recommended foods or beverages. Contact your dietitian for more options. °WHAT FOODS ARE NOT RECOMMENDED? °Grains °White bread. Pasta made with refined flour. White rice. °Vegetables °Fried potatoes. Canned vegetables. Well-cooked vegetables.  °Fruits °Fruit juice. Cooked, strained fruit. °Meats and Other Protein Sources °Fatty cuts of meat. Fried poultry or fried fish. °Dairy °Milk. Yogurt. Cream cheese. Sour cream. °Beverages °Soft drinks. °Other °Cakes and pastries. Butter and oils. °The items listed above may not be a complete list of foods and beverages to avoid. Contact your dietitian for more information. °WHAT ARE SOME TIPS FOR INCLUDING HIGH-FIBER FOODS IN MY DIET? °· Eat a wide variety of high-fiber foods. °· Make sure that half of all grains consumed each day are whole grains. °· Replace breads and cereals made from refined flour or white flour with whole-grain breads and cereals. °· Replace white rice with brown rice, bulgur wheat, or millet. °· Start   the day with a breakfast that is high in fiber, such as a cereal that contains at least 5 grams of fiber per serving. °· Use beans in place of meat in soups, salads, or pasta. °· Eat high-fiber snacks, such as berries, raw vegetables, nuts, or popcorn. °  °This information is not intended to replace advice given to you by your health care provider. Make sure you discuss any questions you have with your health care provider. °  °Document Released: 02/09/2005 Document Revised: 03/02/2014 Document Reviewed: 07/25/2013 °Elsevier Interactive Patient Education ©2016 [employer] ° °

## 2015-01-15 NOTE — ED Notes (Signed)
Pt stable, ambulatory, states understanding of discharge instructions 

## 2015-02-13 ENCOUNTER — Emergency Department (HOSPITAL_COMMUNITY)
Admission: EM | Admit: 2015-02-13 | Discharge: 2015-02-13 | Disposition: A | Discharge status: Home / Self Care | Payer: Medicaid Other | Attending: Emergency Medicine | Admitting: Emergency Medicine

## 2015-02-13 ENCOUNTER — Encounter (HOSPITAL_COMMUNITY): Payer: Self-pay | Admitting: *Deleted

## 2015-02-13 ENCOUNTER — Emergency Department (HOSPITAL_COMMUNITY): Payer: Medicaid Other

## 2015-02-13 DIAGNOSIS — Z791 Long term (current) use of non-steroidal anti-inflammatories (NSAID): Secondary | ICD-10-CM | POA: Diagnosis not present

## 2015-02-13 DIAGNOSIS — Z79899 Other long term (current) drug therapy: Secondary | ICD-10-CM | POA: Insufficient documentation

## 2015-02-13 DIAGNOSIS — Z8781 Personal history of (healed) traumatic fracture: Secondary | ICD-10-CM | POA: Insufficient documentation

## 2015-02-13 DIAGNOSIS — F17200 Nicotine dependence, unspecified, uncomplicated: Secondary | ICD-10-CM | POA: Insufficient documentation

## 2015-02-13 DIAGNOSIS — Z3202 Encounter for pregnancy test, result negative: Secondary | ICD-10-CM | POA: Insufficient documentation

## 2015-02-13 DIAGNOSIS — Z8739 Personal history of other diseases of the musculoskeletal system and connective tissue: Secondary | ICD-10-CM | POA: Diagnosis not present

## 2015-02-13 DIAGNOSIS — R109 Unspecified abdominal pain: Secondary | ICD-10-CM

## 2015-02-13 DIAGNOSIS — G8929 Other chronic pain: Secondary | ICD-10-CM | POA: Diagnosis not present

## 2015-02-13 DIAGNOSIS — Z8719 Personal history of other diseases of the digestive system: Secondary | ICD-10-CM | POA: Diagnosis not present

## 2015-02-13 LAB — COMPREHENSIVE METABOLIC PANEL
ALBUMIN: 3.9 g/dL (ref 3.5–5.0)
ALK PHOS: 49 U/L (ref 38–126)
ALT: 20 U/L (ref 14–54)
AST: 19 U/L (ref 15–41)
Anion gap: 7 (ref 5–15)
BILIRUBIN TOTAL: 0.4 mg/dL (ref 0.3–1.2)
BUN: 9 mg/dL (ref 6–20)
CHLORIDE: 106 mmol/L (ref 101–111)
CO2: 25 mmol/L (ref 22–32)
CREATININE: 0.74 mg/dL (ref 0.44–1.00)
Calcium: 8.8 mg/dL — ABNORMAL LOW (ref 8.9–10.3)
GFR calc Af Amer: >60 mL/min (ref >60)
GFR calc non Af Amer: >60 mL/min (ref >60)
Glucose, Bld: 108 mg/dL — ABNORMAL HIGH (ref 65–99)
POTASSIUM: 3.8 mmol/L (ref 3.5–5.1)
Sodium: 138 mmol/L (ref 135–145)
Total Protein: 7.1 g/dL (ref 6.5–8.1)

## 2015-02-13 LAB — LIPASE, BLOOD: Lipase: 22 U/L (ref 11–51)

## 2015-02-13 LAB — PREGNANCY, URINE: Preg Test, Ur: NEGATIVE

## 2015-02-13 LAB — CBC
HEMATOCRIT: 37.9 % (ref 36.0–46.0)
HEMOGLOBIN: 12.7 g/dL (ref 12.0–15.0)
MCH: 29.2 pg (ref 26.0–34.0)
MCHC: 33.5 g/dL (ref 30.0–36.0)
MCV: 87.1 fL (ref 78.0–100.0)
PLATELETS: 240 10*3/uL (ref 150–400)
RBC: 4.35 MIL/uL (ref 3.87–5.11)
RDW: 13.8 % (ref 11.5–15.5)
WBC: 7.1 10*3/uL (ref 4.0–10.5)

## 2015-02-13 LAB — URINALYSIS, ROUTINE W REFLEX MICROSCOPIC
GLUCOSE, UA: NEGATIVE mg/dL
HGB URINE DIPSTICK: NEGATIVE
Ketones, ur: 15 mg/dL — AB
Nitrite: NEGATIVE
PROTEIN: NEGATIVE mg/dL
SPECIFIC GRAVITY, URINE: 1.025 (ref 1.005–1.030)
pH: 5.5 (ref 5.0–8.0)

## 2015-02-13 LAB — URINE MICROSCOPIC-ADD ON

## 2015-02-13 MED ORDER — IOHEXOL 300 MG/ML  SOLN
100.0000 mL | Freq: Once | INTRAMUSCULAR | Status: AC | PRN
  Administered 2015-02-13: 100 mL via INTRAVENOUS

## 2015-02-13 MED ORDER — NAPROXEN 500 MG PO TABS: 500.0000 mg | ORAL_TABLET | Freq: Two times a day (BID) | ORAL | Status: DC

## 2015-02-13 MED ORDER — DICYCLOMINE HCL 20 MG PO TABS: 20.0000 mg | ORAL_TABLET | Freq: Two times a day (BID) | ORAL | Status: DC

## 2015-02-13 MED ORDER — ONDANSETRON HCL 4 MG/2ML IJ SOLN
4.0000 mg | Freq: Once | INTRAMUSCULAR | Status: AC
  Administered 2015-02-13: 4 mg via INTRAVENOUS
  Filled 2015-02-13: qty 2

## 2015-02-13 MED ORDER — FENTANYL CITRATE (PF) 100 MCG/2ML IJ SOLN
100.0000 ug | Freq: Once | INTRAMUSCULAR | Status: AC
  Administered 2015-02-13: 100 ug via INTRAVENOUS
  Filled 2015-02-13: qty 2

## 2015-02-13 NOTE — ED Provider Notes (Signed)
CSN: [id number]     Arrival date & time 02/13/15  1313 History   First MD Initiated Contact with Patient 02/13/15 1401     Chief Complaint  Patient presents with  . Abdominal Pain     (Consider location/radiation/quality/duration/timing/severity/associated sxs/prior Treatment) HPI Comments: Patient with history of umbilical hernia presents with complaint of severe abdominal cramping in her mid abdomen starting while she was doing stomach crunches. Patient states "this feels like the last time my hernia strangulated". When asked further, she has never had any abdominal surgeries related to her hernia. Pain does not radiate. There is no associated fever, nausea, vomiting, diarrhea. No irritative urinary tract symptoms. No vaginal bleeding or discharge. Last menstrual period was 01/21/15. No treatments prior to arrival. Patient uses Goody powder several times a week. She denies alcohol use.  Last abdominal CT scan in our system from 2013 showing fat-containing umbilical hernia, no strangulation or incarceration.  Patient is a 29 y.o. female presenting with abdominal pain. The history is provided by the patient and medical records.  Abdominal Pain Associated symptoms: no chest pain, no cough, no diarrhea, no dysuria, no fever, no nausea, no sore throat and no vomiting     Past Medical History  Diagnosis Date  . Bulging disc   . Umbilical hernia   . Chronic back pain   . Fracture of sternum    Past Surgical History  Procedure Laterality Date  . Tubal ligation    . Knee surgery     History reviewed. No pertinent family history. Social History  Substance Use Topics  . Smoking status: Current Every Day Smoker  . Smokeless tobacco: None  . Alcohol Use: Yes   OB History    No data available     Review of Systems  Constitutional: Negative for fever.  HENT: Negative for rhinorrhea and sore throat.   Eyes: Negative for redness.  Respiratory: Negative for cough.   Cardiovascular:  Negative for chest pain.  Gastrointestinal: Positive for abdominal pain. Negative for nausea, vomiting and diarrhea.  Genitourinary: Negative for dysuria.  Musculoskeletal: Negative for myalgias.  Skin: Negative for rash.  Neurological: Negative for headaches.    Allergies  Review of patient's allergies indicates no known allergies.  Home Medications   Prior to Admission medications   Medication Sig Start Date End Date Taking? Authorizing Provider  docusate sodium (COLACE) 100 MG capsule Take 1 capsule (100 mg total) by mouth every 12 (twelve) hours. 01/15/15  Yes Gilda Crease, MD  gabapentin (NEURONTIN) 100 MG capsule Take 1-2 capsules by mouth 3 (three) times daily. 09/19/14  Yes Historical Provider, MD  cephALEXin (KEFLEX) 500 MG capsule Take 1 capsule (500 mg total) by mouth 4 (four) times daily. Patient not taking: Reported on 02/13/2015 12/08/14   Roxy Horseman, PA-C  HYDROcodone-acetaminophen (NORCO/VICODIN) 5-325 MG tablet Take 2 tablets by mouth every 4 (four) hours as needed for moderate pain. 01/15/15   Gilda Crease, MD  lidocaine (LIDODERM) 5 % Place 1 patch onto the skin daily. Remove & Discard patch within 12 hours or as directed by MD 08/14/14   April Palumbo, MD  meloxicam (MOBIC) 7.5 MG tablet Take 1 tablet (7.5 mg total) by mouth daily. 08/14/14   April Palumbo, MD  methocarbamol (ROBAXIN) 500 MG tablet Take 1 tablet (500 mg total) by mouth 3 (three) times daily. 08/14/14   April Palumbo, MD   BP 128/81 mmHg  Pulse 102  Temp(Src) 98.2 F (36.8 C) (Oral)  Resp 16  SpO2 98%  LMP 01/21/2015   Physical Exam  Constitutional: She appears well-developed and well-nourished. She appears distressed.  Patient rocking on bed, appears to be in pain  HENT:  Head: Normocephalic and atraumatic.  Eyes: Conjunctivae are normal. Right eye exhibits no discharge. Left eye exhibits no discharge.  Neck: Normal range of motion. Neck supple.  Cardiovascular: Normal  rate, regular rhythm and normal heart sounds.   Pulmonary/Chest: Effort normal and breath sounds normal.  Abdominal: Soft. She exhibits no distension. Bowel sounds are decreased. There is tenderness (generalized, moderate). There is no rebound and no guarding.  Patient with easily reducible umbilical hernia. Abdominal wall defect palpated at site of hernia.  Neurological: She is alert.  Skin: Skin is warm and dry.  Psychiatric: She has a normal mood and affect.  Nursing note and vitals reviewed.   ED Course  Procedures (including critical care time) Labs Review Labs Reviewed  COMPREHENSIVE METABOLIC PANEL - Abnormal; Notable for the following:    Glucose, Bld 108 (*)    Calcium 8.8 (*)    All other components within normal limits  URINALYSIS, ROUTINE W REFLEX MICROSCOPIC (NOT [facility]) - Abnormal; Notable for the following:    APPearance HAZY (*)    Bilirubin Urine SMALL (*)    Ketones, ur 15 (*)    Leukocytes, UA SMALL (*)    All other components within normal limits  URINE MICROSCOPIC-ADD ON - Abnormal; Notable for the following:    Squamous Epithelial / LPF 6-30 (*)    Bacteria, UA FEW (*)    All other components within normal limits  LIPASE, BLOOD  CBC  PREGNANCY, URINE  POC URINE PREG, ED    Imaging Review Ct Abdomen Pelvis W Contrast  02/13/2015  CLINICAL DATA:  Severe abdominal pain while working out today. History of umbilical hernia. EXAM: CT ABDOMEN AND PELVIS WITH CONTRAST TECHNIQUE: Multidetector CT imaging of the abdomen and pelvis was performed using the standard protocol following bolus administration of intravenous contrast. CONTRAST:  100mL OMNIPAQUE IOHEXOL 300 MG/ML  SOLN COMPARISON:  10/24/2011 FINDINGS: Minimal dependent atelectasis in the lung bases. Heart is normal size. No effusions. Liver, gallbladder, spleen, pancreas, adrenals and kidneys are unremarkable. Small cyst in the midpole of the right kidney. No hydronephrosis. Tiny punctate nonobstructing left  lower pole renal stone again noted, stable. Uterus, adnexae and urinary bladder unremarkable. Tubal ligation clips in the pelvis. Trace free fluid in the pelvis. Aorta is normal caliber. Stomach, large and small bowel are unremarkable. The appendix is visualized and is normal. No acute bony abnormality or focal bone lesion. IMPRESSION: No acute findings. Left lower pole nephrolithiasis, stable. Electronically Signed   By: Charlett NoseKevin  Dover M.D.   On: 02/13/2015 19:01   I have personally reviewed and evaluated these images and lab results as part of my medical decision-making.   EKG Interpretation None       2:18 PM Patient seen and examined. Work-up initiated. Medications ordered.   Vital signs reviewed and are as follows: BP 128/81 mmHg  Pulse 102  Temp(Src) 98.2 F (36.8 C) (Oral)  Resp 16  SpO2 98%  LMP 01/21/2015  7:21 PM Patient states that she is feeling much better and she only has some residual soreness at the current time. Will d/c to home. Encouraged NSAIDs, bentyl and heating pad.  The patient was urged to return to the Emergency Department immediately with worsening of current symptoms, worsening abdominal pain, persistent vomiting, blood noted in stools, fever, or  any other concerns. The patient verbalized understanding.     MDM   Final diagnoses:  Abdominal cramping   Patient with history of umbilical hernia, abdominal pain which started after exercise. Unremarkable eval in emergency department including CT scan. Patient is feeling better and is ready for discharge to home. Do not suspect emergent etiology of her pain requiring admission or specialist consultation.   Renne Crigler, PA-C 02/13/15 [phone]  Melene Plan, DO 02/14/15 [phone]

## 2015-02-13 NOTE — ED Notes (Signed)
Pt reports onset today of severe abd pain while working out. Hx of hernia.

## 2015-02-13 NOTE — Discharge Instructions (Signed)
Please read and follow all provided instructions.  Your diagnoses today include:  1. Abdominal cramping     Tests performed today include:  Blood counts and electrolytes  Blood tests to check liver and kidney function  Blood tests to check pancreas function  Urine test to look for infection and pregnancy (in women)  CT scan - no serious problems  Vital signs. See below for your results today.   Medications prescribed:   Bentyl - medication for intestinal cramps and spasms   Naproxen - anti-inflammatory pain medication  Do not exceed 500mg  naproxen every 12 hours, take with food  You have been prescribed an anti-inflammatory medication or NSAID. Take with food. Take smallest effective dose for the shortest duration needed for your pain. Stop taking if you experience stomach pain or vomiting. ]  Take any prescribed medications only as directed.  Home care instructions:   Follow any educational materials contained in this packet.  Follow-up instructions: Please follow-up with your primary care provider in the next 3 days for further evaluation of your symptoms.    Return instructions:  SEEK IMMEDIATE MEDICAL ATTENTION IF:  The pain does not go away or becomes severe   A temperature above 101F develops   Repeated vomiting occurs (multiple episodes)   The pain becomes localized to portions of the abdomen. The right side could possibly be appendicitis. In an adult, the left lower portion of the abdomen could be colitis or diverticulitis.   Blood is being passed in stools or vomit (bright red or black tarry stools)   You develop chest pain, difficulty breathing, dizziness or fainting, or become confused, poorly responsive, or inconsolable (young children)  If you have any other emergent concerns regarding your health  Additional Information: Abdominal (belly) pain can be caused by many things. Your caregiver performed an examination and possibly ordered blood/urine  tests and imaging (CT scan, x-rays, ultrasound). Many cases can be observed and treated at home after initial evaluation in the emergency department. Even though you are being discharged home, abdominal pain can be unpredictable. Therefore, you need a repeated exam if your pain does not resolve, returns, or worsens. Most patients with abdominal pain don't have to be admitted to the hospital or have surgery, but serious problems like appendicitis and gallbladder attacks can start out as nonspecific pain. Many abdominal conditions cannot be diagnosed in one visit, so follow-up evaluations are very important.  Your vital signs today were: BP 88/51 mmHg   Pulse 68   Temp(Src) 98.2 F (36.8 C) (Oral)   Resp 16   SpO2 96%   LMP 01/21/2015 If your blood pressure (bp) was elevated above 135/85 this visit, please have this repeated by your doctor within one month. --------------

## 2016-01-23 ENCOUNTER — Emergency Department (HOSPITAL_COMMUNITY): Payer: Self-pay

## 2016-01-23 ENCOUNTER — Encounter (HOSPITAL_COMMUNITY): Payer: Self-pay | Admitting: Emergency Medicine

## 2016-01-23 ENCOUNTER — Inpatient Hospital Stay (HOSPITAL_COMMUNITY)
Admission: EM | Admit: 2016-01-23 | Discharge: 2016-02-10 | DRG: 673 | Disposition: A | Discharge status: Home / Self Care | Payer: Self-pay | Attending: Internal Medicine | Admitting: Internal Medicine

## 2016-01-23 DIAGNOSIS — R4182 Altered mental status, unspecified: Secondary | ICD-10-CM

## 2016-01-23 DIAGNOSIS — I959 Hypotension, unspecified: Secondary | ICD-10-CM

## 2016-01-23 DIAGNOSIS — E875 Hyperkalemia: Secondary | ICD-10-CM | POA: Diagnosis present

## 2016-01-23 DIAGNOSIS — IMO0002 Reserved for concepts with insufficient information to code with codable children: Secondary | ICD-10-CM

## 2016-01-23 DIAGNOSIS — N760 Acute vaginitis: Secondary | ICD-10-CM | POA: Diagnosis present

## 2016-01-23 DIAGNOSIS — F329 Major depressive disorder, single episode, unspecified: Secondary | ICD-10-CM | POA: Diagnosis present

## 2016-01-23 DIAGNOSIS — R188 Other ascites: Secondary | ICD-10-CM | POA: Diagnosis present

## 2016-01-23 DIAGNOSIS — F17200 Nicotine dependence, unspecified, uncomplicated: Secondary | ICD-10-CM | POA: Diagnosis present

## 2016-01-23 DIAGNOSIS — F1110 Opioid abuse, uncomplicated: Secondary | ICD-10-CM | POA: Diagnosis present

## 2016-01-23 DIAGNOSIS — T796XXA Traumatic ischemia of muscle, initial encounter: Secondary | ICD-10-CM | POA: Diagnosis present

## 2016-01-23 DIAGNOSIS — M6282 Rhabdomyolysis: Secondary | ICD-10-CM

## 2016-01-23 DIAGNOSIS — E8770 Fluid overload, unspecified: Secondary | ICD-10-CM | POA: Diagnosis present

## 2016-01-23 DIAGNOSIS — N200 Calculus of kidney: Secondary | ICD-10-CM | POA: Diagnosis present

## 2016-01-23 DIAGNOSIS — G8929 Other chronic pain: Secondary | ICD-10-CM | POA: Diagnosis present

## 2016-01-23 DIAGNOSIS — B179 Acute viral hepatitis, unspecified: Secondary | ICD-10-CM | POA: Diagnosis present

## 2016-01-23 DIAGNOSIS — F1920 Other psychoactive substance dependence, uncomplicated: Secondary | ICD-10-CM

## 2016-01-23 DIAGNOSIS — F1190 Opioid use, unspecified, uncomplicated: Secondary | ICD-10-CM

## 2016-01-23 DIAGNOSIS — S3681XA Injury of peritoneum, initial encounter: Secondary | ICD-10-CM | POA: Diagnosis present

## 2016-01-23 DIAGNOSIS — R8271 Bacteriuria: Secondary | ICD-10-CM

## 2016-01-23 DIAGNOSIS — M79651 Pain in right thigh: Secondary | ICD-10-CM | POA: Diagnosis present

## 2016-01-23 DIAGNOSIS — N170 Acute kidney failure with tubular necrosis: Principal | ICD-10-CM | POA: Diagnosis present

## 2016-01-23 DIAGNOSIS — N186 End stage renal disease: Secondary | ICD-10-CM

## 2016-01-23 DIAGNOSIS — T1490XA Injury, unspecified, initial encounter: Secondary | ICD-10-CM

## 2016-01-23 DIAGNOSIS — T401X1A Poisoning by heroin, accidental (unintentional), initial encounter: Secondary | ICD-10-CM | POA: Diagnosis present

## 2016-01-23 DIAGNOSIS — R609 Edema, unspecified: Secondary | ICD-10-CM

## 2016-01-23 DIAGNOSIS — R229 Localized swelling, mass and lump, unspecified: Secondary | ICD-10-CM

## 2016-01-23 DIAGNOSIS — J690 Pneumonitis due to inhalation of food and vomit: Secondary | ICD-10-CM | POA: Diagnosis present

## 2016-01-23 DIAGNOSIS — M25461 Effusion, right knee: Secondary | ICD-10-CM

## 2016-01-23 DIAGNOSIS — M25561 Pain in right knee: Secondary | ICD-10-CM

## 2016-01-23 DIAGNOSIS — B1920 Unspecified viral hepatitis C without hepatic coma: Secondary | ICD-10-CM

## 2016-01-23 DIAGNOSIS — K7200 Acute and subacute hepatic failure without coma: Secondary | ICD-10-CM | POA: Diagnosis present

## 2016-01-23 DIAGNOSIS — W19XXXA Unspecified fall, initial encounter: Secondary | ICD-10-CM | POA: Diagnosis present

## 2016-01-23 DIAGNOSIS — M79606 Pain in leg, unspecified: Secondary | ICD-10-CM

## 2016-01-23 DIAGNOSIS — N19 Unspecified kidney failure: Secondary | ICD-10-CM

## 2016-01-23 DIAGNOSIS — M2540 Effusion, unspecified joint: Secondary | ICD-10-CM

## 2016-01-23 DIAGNOSIS — N179 Acute kidney failure, unspecified: Secondary | ICD-10-CM | POA: Diagnosis present

## 2016-01-23 DIAGNOSIS — B9689 Other specified bacterial agents as the cause of diseases classified elsewhere: Secondary | ICD-10-CM

## 2016-01-23 DIAGNOSIS — G629 Polyneuropathy, unspecified: Secondary | ICD-10-CM | POA: Diagnosis present

## 2016-01-23 DIAGNOSIS — D649 Anemia, unspecified: Secondary | ICD-10-CM

## 2016-01-23 DIAGNOSIS — E872 Acidosis: Secondary | ICD-10-CM | POA: Diagnosis present

## 2016-01-23 DIAGNOSIS — R220 Localized swelling, mass and lump, head: Secondary | ICD-10-CM

## 2016-01-23 DIAGNOSIS — F1120 Opioid dependence, uncomplicated: Secondary | ICD-10-CM | POA: Diagnosis present

## 2016-01-23 DIAGNOSIS — R52 Pain, unspecified: Secondary | ICD-10-CM

## 2016-01-23 DIAGNOSIS — R739 Hyperglycemia, unspecified: Secondary | ICD-10-CM | POA: Diagnosis present

## 2016-01-23 DIAGNOSIS — K5900 Constipation, unspecified: Secondary | ICD-10-CM | POA: Diagnosis not present

## 2016-01-23 LAB — I-STAT BETA HCG BLOOD, ED (MC, WL, AP ONLY): I-stat hCG, quantitative: <5 m[IU]/mL (ref <5)

## 2016-01-23 LAB — CBC WITH DIFFERENTIAL/PLATELET
BASOS PCT: 0 %
Basophils Absolute: 0 10*3/uL (ref 0.0–0.1)
EOS ABS: 0 10*3/uL (ref 0.0–0.7)
EOS PCT: 0 %
HCT: 47.1 % — ABNORMAL HIGH (ref 36.0–46.0)
HEMOGLOBIN: 15.8 g/dL — AB (ref 12.0–15.0)
Lymphocytes Relative: 6 %
Lymphs Abs: 0.9 10*3/uL (ref 0.7–4.0)
MCH: 30.9 pg (ref 26.0–34.0)
MCHC: 33.5 g/dL (ref 30.0–36.0)
MCV: 92 fL (ref 78.0–100.0)
Monocytes Absolute: 1 10*3/uL (ref 0.1–1.0)
Monocytes Relative: 7 %
NEUTROS PCT: 87 %
Neutro Abs: 12.3 10*3/uL — ABNORMAL HIGH (ref 1.7–7.7)
PLATELETS: 232 10*3/uL (ref 150–400)
RBC: 5.12 MIL/uL — AB (ref 3.87–5.11)
RDW: 13.1 % (ref 11.5–15.5)
WBC: 14.1 10*3/uL — AB (ref 4.0–10.5)

## 2016-01-23 LAB — URINALYSIS, ROUTINE W REFLEX MICROSCOPIC
GLUCOSE, UA: NEGATIVE mg/dL
KETONES UR: 15 mg/dL — AB
Nitrite: POSITIVE — AB
PH: 5 (ref 5.0–8.0)
Protein, ur: 100 mg/dL — AB
SPECIFIC GRAVITY, URINE: 1.021 (ref 1.005–1.030)

## 2016-01-23 LAB — COMPREHENSIVE METABOLIC PANEL
ALBUMIN: 3.2 g/dL — AB (ref 3.5–5.0)
ALK PHOS: 85 U/L (ref 38–126)
ALK PHOS: 55 U/L (ref 38–126)
ALT: 705 U/L — ABNORMAL HIGH (ref 14–54)
ALT: 625 U/L — ABNORMAL HIGH (ref 14–54)
ANION GAP: 12 (ref 5–15)
ANION GAP: 7 (ref 5–15)
AST: 1938 U/L — ABNORMAL HIGH (ref 15–41)
AST: 1706 U/L — ABNORMAL HIGH (ref 15–41)
Albumin: 2.2 g/dL — ABNORMAL LOW (ref 3.5–5.0)
BILIRUBIN TOTAL: 0.4 mg/dL (ref 0.3–1.2)
BUN: 29 mg/dL — ABNORMAL HIGH (ref 6–20)
BUN: 32 mg/dL — ABNORMAL HIGH (ref 6–20)
CALCIUM: 6.8 mg/dL — AB (ref 8.9–10.3)
CALCIUM: 5.6 mg/dL — AB (ref 8.9–10.3)
CHLORIDE: 101 mmol/L (ref 101–111)
CO2: 22 mmol/L (ref 22–32)
CO2: 21 mmol/L — ABNORMAL LOW (ref 22–32)
Chloride: 108 mmol/L (ref 101–111)
Creatinine, Ser: 2.26 mg/dL — ABNORMAL HIGH (ref 0.44–1.00)
Creatinine, Ser: 2.3 mg/dL — ABNORMAL HIGH (ref 0.44–1.00)
GFR calc non Af Amer: 28 mL/min — ABNORMAL LOW (ref >60)
GFR, EST AFRICAN AMERICAN: 32 mL/min — AB (ref >60)
GFR, EST AFRICAN AMERICAN: 31 mL/min — AB (ref >60)
GFR, EST NON AFRICAN AMERICAN: 27 mL/min — AB (ref >60)
GLUCOSE: 216 mg/dL — AB (ref 65–99)
Glucose, Bld: 96 mg/dL (ref 65–99)
POTASSIUM: 5.7 mmol/L — AB (ref 3.5–5.1)
Potassium: 6.2 mmol/L — ABNORMAL HIGH (ref 3.5–5.1)
SODIUM: 135 mmol/L (ref 135–145)
Sodium: 136 mmol/L (ref 135–145)
TOTAL PROTEIN: 4.7 g/dL — AB (ref 6.5–8.1)
Total Bilirubin: 0.4 mg/dL (ref 0.3–1.2)
Total Protein: 6.5 g/dL (ref 6.5–8.1)

## 2016-01-23 LAB — PROTIME-INR
INR: 1.16
PROTHROMBIN TIME: 14.9 s (ref 11.4–15.2)

## 2016-01-23 LAB — I-STAT CG4 LACTIC ACID, ED
LACTIC ACID, VENOUS: 2.54 mmol/L — AB (ref 0.5–1.9)
LACTIC ACID, VENOUS: 1.46 mmol/L (ref 0.5–1.9)

## 2016-01-23 LAB — URINE MICROSCOPIC-ADD ON

## 2016-01-23 LAB — RAPID URINE DRUG SCREEN, HOSP PERFORMED
AMPHETAMINES: NOT DETECTED
BENZODIAZEPINES: NOT DETECTED
Barbiturates: NOT DETECTED
COCAINE: NOT DETECTED
OPIATES: POSITIVE — AB
TETRAHYDROCANNABINOL: NOT DETECTED

## 2016-01-23 LAB — MRSA PCR SCREENING: MRSA BY PCR: POSITIVE — AB

## 2016-01-23 LAB — HEMOGLOBIN A1C
HEMOGLOBIN A1C: 5.4 % (ref 4.8–5.6)
MEAN PLASMA GLUCOSE: 108 mg/dL

## 2016-01-23 LAB — CBG MONITORING, ED: GLUCOSE-CAPILLARY: 276 mg/dL — AB (ref 65–99)

## 2016-01-23 LAB — ACETAMINOPHEN LEVEL

## 2016-01-23 LAB — CK: CK TOTAL: 50000 U/L — AB (ref 38–234)

## 2016-01-23 LAB — ETHANOL: Alcohol, Ethyl (B): <5 mg/dL (ref <5)

## 2016-01-23 MED ORDER — HEPARIN SODIUM (PORCINE) 5000 UNIT/ML IJ SOLN
5000.0000 [IU] | Freq: Three times a day (TID) | INTRAMUSCULAR | Status: DC
  Administered 2016-01-23 – 2016-02-02 (×25): 5000 [IU] via SUBCUTANEOUS
  Filled 2016-01-23 (×23): qty 1

## 2016-01-23 MED ORDER — VANCOMYCIN HCL 10 G IV SOLR
1500.0000 mg | Freq: Once | INTRAVENOUS | Status: AC
  Administered 2016-01-23: 1500 mg via INTRAVENOUS
  Filled 2016-01-23: qty 1500

## 2016-01-23 MED ORDER — METHOCARBAMOL 500 MG PO TABS
1500.0000 mg | ORAL_TABLET | Freq: Four times a day (QID) | ORAL | Status: AC | PRN
  Administered 2016-01-23 – 2016-01-26 (×10): 1500 mg via ORAL
  Filled 2016-01-23 (×11): qty 3

## 2016-01-23 MED ORDER — SODIUM CHLORIDE 0.9 % IV SOLN: INTRAVENOUS | Status: DC

## 2016-01-23 MED ORDER — DEXTROSE 50 % IV SOLN
2.0000 | Freq: Once | INTRAVENOUS | Status: AC
Start: 2016-01-23 — End: 2016-01-23
  Administered 2016-01-23: 100 mL via INTRAVENOUS
  Filled 2016-01-23: qty 100

## 2016-01-23 MED ORDER — SODIUM BICARBONATE 8.4 % IV SOLN
150.0000 meq | Freq: Once | INTRAVENOUS | Status: AC
Start: 2016-01-23 — End: 2016-01-23
  Administered 2016-01-23: 150 meq via INTRAVENOUS
  Filled 2016-01-23: qty 50

## 2016-01-23 MED ORDER — SODIUM CHLORIDE 0.9 % IV BOLUS (SEPSIS)
2000.0000 mL | Freq: Once | INTRAVENOUS | Status: AC
Start: 2016-01-23 — End: 2016-01-23
  Administered 2016-01-23: 2000 mL via INTRAVENOUS

## 2016-01-23 MED ORDER — CEFEPIME HCL 2 G IJ SOLR
2.0000 g | Freq: Once | INTRAMUSCULAR | Status: AC
  Administered 2016-01-23: 2 g via INTRAVENOUS
  Filled 2016-01-23: qty 2

## 2016-01-23 MED ORDER — SODIUM CHLORIDE 0.9% FLUSH
3.0000 mL | Freq: Two times a day (BID) | INTRAVENOUS | Status: DC
  Administered 2016-01-23 – 2016-02-07 (×18): 3 mL via INTRAVENOUS

## 2016-01-23 MED ORDER — SODIUM CHLORIDE 0.9 % IV SOLN
INTRAVENOUS | Status: AC
  Administered 2016-01-23: 250 mL/h via INTRAVENOUS
  Administered 2016-01-23 – 2016-01-24 (×5): via INTRAVENOUS

## 2016-01-23 MED ORDER — SENNOSIDES-DOCUSATE SODIUM 8.6-50 MG PO TABS
1.0000 | ORAL_TABLET | Freq: Every evening | ORAL | Status: DC | PRN
  Administered 2016-01-23 – 2016-02-01 (×2): 1 via ORAL
  Filled 2016-01-23 (×2): qty 1

## 2016-01-23 MED ORDER — TRAMADOL HCL 50 MG PO TABS
50.0000 mg | ORAL_TABLET | Freq: Two times a day (BID) | ORAL | Status: DC | PRN
  Administered 2016-01-23 (×2): 50 mg via ORAL
  Filled 2016-01-23 (×2): qty 1

## 2016-01-23 MED ORDER — INSULIN ASPART 100 UNIT/ML IV SOLN
10.0000 [IU] | Freq: Once | INTRAVENOUS | Status: AC
  Administered 2016-01-23: 10 [IU] via INTRAVENOUS
  Filled 2016-01-23: qty 1

## 2016-01-23 MED ORDER — SODIUM CHLORIDE 0.9 % IV SOLN
2.0000 g | Freq: Once | INTRAVENOUS | Status: AC
  Administered 2016-01-23: 2 g via INTRAVENOUS
  Filled 2016-01-23: qty 20

## 2016-01-23 MED ORDER — PROMETHAZINE HCL 25 MG PO TABS
12.5000 mg | ORAL_TABLET | Freq: Four times a day (QID) | ORAL | Status: DC | PRN
  Administered 2016-01-23 – 2016-01-24 (×2): 12.5 mg via ORAL
  Filled 2016-01-23 (×2): qty 1

## 2016-01-23 NOTE — ED Triage Notes (Addendum)
Pt. fell at home this morning reports bilateral thigh pain , denies LOC , somnolent at arrival , she admitted injecting Heroin yesterday afternoon , respirations unlabored.

## 2016-01-23 NOTE — ED Notes (Signed)
Pt has been advised to keep BP cuff on at all times.

## 2016-01-23 NOTE — ED Notes (Signed)
Pt requesting to drink. Clydie BraunKaren RN aware.

## 2016-01-23 NOTE — ED Notes (Signed)
Dr at bedside.

## 2016-01-23 NOTE — H&P (Signed)
Date: 01/23/2016               Patient Name:  Alejandra Park MRN: [id number]  DOB: 1985-08-16 Age / Sex: 30 y.o., female   PCP: No Pcp Per Patient         Medical Service: Internal Medicine Teaching Service         Attending Physician: Dr. Earl LagosNischal Narendra, MD    First Contact: Dr. Samuella CotaSvalina Pager: [phone]  Second Contact: Dr. Karma GreaserBoswell Pager: [phone]       After Hours (After 5p/  First Contact Pager: [phone]  weekends / holidays): Second Contact Pager: [phone]   Chief Complaint: fall  History of Present Illness: 30 year old woman with chronic back pain, substance abuse, depression presenting after fall during heroin use. She used heroin around 1PM. She was sitting on the bed. She does not remember losing consciousness. She fell and was on the ground for at least 6 hours. Her roommate came home and put her in the bed at some point. She woke at 1AM and thought she couldn't walk. Both of her legs hurt and have paresthesias. The left side of her face, left shoulder and buttocks all hurt as well. She had ringing in her ears as well.   She last did heroin 6 months. She denies any other substance abuse.   No fevers/chills. A little blurry vision. She has a cough that is productive. No shortness of breath or chest pain. No nausea/vomiting. No abdominal pain. No constipation or diarrhea. No dysuria. She has brown urine. No rash, bleeding/bruising, edema.   Meds:  No outpatient prescriptions have been marked as taking for the 01/23/16 encounter [facility](Hospital Encounter).     Allergies: Allergies as of 01/23/2016  . (No Known Allergies)   Past Medical History:  Diagnosis Date  . Bulging disc   . Chronic back pain   . Fracture of sternum   . Umbilical hernia     Family History:  Denies family history of heart disease or renal disease  Social History:  Uses less than 1/2 ppd for 15 years. Occasional alcohol - a couple of shots of liquor a month. Uses heroin  Review of  Systems: A complete ROS was negative except as per HPI.   Physical Exam: Blood pressure 105/69, pulse 96, temperature 98.5 F (36.9 C), temperature source Oral, resp. rate 17, height 5\' 7"  (1.702 m), weight 160 lb (72.6 kg), SpO2 90 %. General Apperance: NAD Head: Normocephalic, left lower face with swelling Eyes: PERRL, EOMI, anicteric sclera Ears: Normal external ear canal Nose: Nares normal, septum midline, mucosa normal Throat: Lips, mucosa and tongue normal  Neck: Supple, trachea midline Back: No tenderness or bony abnormality  Lungs: Crackles in left lung field. Breathing comfortably  Chest Wall: Nontender, no deformity Heart: Regular rate and rhythm, no murmur/rub/gallop Abdomen: Soft, nontender, nondistended, no rebound/guarding GU: External exam with some redness and swelling of labia and clitoris. No discharge or sores. Extremities: Warm and well perfused, no edema. Right upper arm tender to palpation below shoulder. Right thigh firm and moderately tender to palpation. Pulses: 2+ throughout Skin: No rashes. Neurologic: Alert and oriented x 3. CNII-XII intact. Normal strength and sensation  EKG: Tachycardic, sinus rhythm. V2-3 normalization compared to previous EKG.  CXR: Extensive infiltrate in the left upper lobe and left lower lobe   Assessment & Plan by Problem: 30 year old woman with chronic back pain, substance abuse, depression presenting after fall during heroin use.   Rhabdomyolysis, AKI with  Hyperkalemia: Fall with loss of consciousness from heroin use yesterday. She is afebrile. She was intially tachycardic to 119 and BP as low as 85/51. Potassium 6.2. BUN and creatinine 29 and 2.26 respectively. Corrected calcium is 7. CK 50,000 with large hgb on UA. EKG with no acute abnormalties. She was given 4L of NS in the ED. She was also given 2g calcium gluconate, 10u Novolog with 2 amps of D50, 150 mEq sodium bicarb in the ED. Her lactic acidosis and leukocytosis are  probably related to this problem. -Repeat CMP at 1300 -NS@250ml /hr -Strict in/out, daily weight. Goal UOP of about 200-300 ml/hr. -Tramadol 50mg  q12 hr prn pain, Robaxin 1500mg  q6hr prn muscle spasm -Avoid calcium supplementation unless managing hyperkalemia or if she develops signs/symptoms of hypocalcemia -Right thigh with probable hematoma given exam. Monitor for compartment syndrome.  Acute hepatitis: AST 1938 and ALT 705. b hCG negative. Probably secondary to rhabdomyolysis. Will check for underlying or concomitant etiologies of acute hepatitis. Tylenol lvl and EtOH neg.  -Check PT/INR -Check for acute and/or chronic viral hepatitis.   Abnormal UA: UA with many bacteria, small leukocytes, positive ntirite, 6-30 squamous epithelial cells. Consistent with a non clean catch sample. She is asymptomatic. She was given vanc and cefepime in the ED.  -Discontinue abx  Aspiration pneumonitis: She denies fevers or chills but does have a productive cough. She is unable to describe her sputum quality. CXR with extensive infiltrate in left upper lobe and left lower lobe. Concern for aspiration given her loss of consciousness with her heroin use. -Continue to observe for now. Would consider antibiotics after about 48 hours if she fails to improve or worsens.  Opioid use disorder:  -HIV screen -May consider Suboxone as outpatient  Hyperglycemia: Glucose 216 prior to dextrose administration. Will check Hgb A1c.   Vulvovaginitis: Complaint of vulva irritation. She last had intercourse 3-4 days ago. No discharge. No sores/ulcers. Monitor for now.  FEN: Regular diet, NS@250ml /hr VTE ppx: subq hep Code status: FULL  Dispo: Admit patient to Inpatient with expected length of stay greater than 2 midnights.  Signed: Lora PaulaJennifer T Krall, MD 01/23/2016, 7:54 AM  Pager: [phone]

## 2016-01-23 NOTE — ED Notes (Signed)
Doctor notified and made aware of patient's Calcium level of 5.6

## 2016-01-23 NOTE — ED Provider Notes (Signed)
[facility]-EMERGENCY DEPT Provider Note   CSN: [id number] Arrival date & time: 01/23/16  [id number]     History   Chief Complaint Chief Complaint  Patient presents with  . Fall    Thigh pain   . Drug Problem    HPI Alejandra FerronRebecca D Chacko is a 30 y.o. female.  HPI Alejandra Park is a 30 y.o. female with PMH significant for chronic back pain, substance abuse, suicidal ideations who presents with heroin use and fall.  Patient states she shot heroin yesterday around 1 PM and then does not remember much after that.  She states she must of fallen off the bed because her left hip has red marks and her left face has red marks and that her roommate must've come home and moved her to the bed.  She states that she cannot walk now and has decreased sensation throughout her legs and arms.  No fever, chills, neck pain, back pain, abdominal pain, or urinary symptoms.  She denies any drugs or alcohol.   Past Medical History:  Diagnosis Date  . Bulging disc   . Chronic back pain   . Fracture of sternum   . Umbilical hernia     Patient Active Problem List   Diagnosis Date Noted  . AKI (acute kidney injury) (HCC) 01/23/2016  . Traumatic rhabdomyolysis (HCC) 01/23/2016  . Heroin use   . Hyperkalemia   . Aspiration pneumonitis (HCC)   . Polysubstance dependence including opioid type drug, continuous use (HCC) 06/09/2011  . Substance induced mood disorder (HCC) 06/09/2011  . Back pain, chronic 06/08/2011  . Custody issue 06/08/2011  . Major depression 06/05/2011  . Opiate dependence (HCC) 06/05/2011  . Benzodiazepine dependence (HCC) 06/05/2011    Past Surgical History:  Procedure Laterality Date  . KNEE SURGERY    . TUBAL LIGATION      OB History    No data available       Home Medications    Prior to Admission medications   Not on File    Family History No family history on file.  Social History Social History  Substance Use Topics  . Smoking status: Current Every Day Smoker  .  Smokeless tobacco: Not on file  . Alcohol use Yes     Allergies   Patient has no known allergies.   Review of Systems Review of Systems All other systems negative unless otherwise stated in HPI   Physical Exam Updated Vital Signs BP 108/86 (BP Location: Left Arm)   Pulse 99   Temp 98.5 F (36.9 C) (Oral)   Resp 20   Ht 5\' 7"  (1.702 m)   Wt 72.6 kg   SpO2 92%   BMI 25.06 kg/m   Physical Exam  Constitutional: She is oriented to person, place, and time. She appears well-developed and well-nourished.  Non-toxic appearance. She does not have a sickly appearance. She does not appear ill.  HENT:  Head: Normocephalic and atraumatic.  Mouth/Throat: Oropharynx is clear and moist.  Erythema to left jaw line, no crepitus or abrasions.   Eyes: Conjunctivae are normal. Pupils are equal, round, and reactive to light.  Neck: Normal range of motion. Neck supple.  Cardiovascular: Normal rate and regular rhythm.   Pulmonary/Chest: Effort normal. No accessory muscle usage or stridor. No respiratory distress. She has decreased breath sounds. She has no wheezes. She has no rhonchi. She has no rales.  Abdominal: Soft. Bowel sounds are normal. She exhibits no distension. There is no tenderness.  Musculoskeletal: Normal range of motion.  Lymphadenopathy:    She has no cervical adenopathy.  Neurological: She is alert and oriented to person, place, and time.  Speech clear without dysarthria.  Skin: Skin is warm and dry.  Erythema to left hip without abrasions or tenderness.   Psychiatric: She has a normal mood and affect. Her behavior is normal.     ED Treatments / Results  Labs (all labs ordered are listed, but only abnormal results are displayed) Labs Reviewed  CBC WITH DIFFERENTIAL/PLATELET - Abnormal; Notable for the following:       Result Value   WBC 14.1 (*)    RBC 5.12 (*)    Hemoglobin 15.8 (*)    HCT 47.1 (*)    Neutro Abs 12.3 (*)    All other components within normal  limits  COMPREHENSIVE METABOLIC PANEL - Abnormal; Notable for the following:    Potassium 6.2 (*)    Glucose, Bld 216 (*)    BUN 29 (*)    Creatinine, Ser 2.26 (*)    Calcium 6.8 (*)    Albumin 3.2 (*)    AST 1,938 (*)    ALT 705 (*)    GFR calc non Af Amer 28 (*)    GFR calc Af Amer 32 (*)    All other components within normal limits  RAPID URINE DRUG SCREEN, HOSP PERFORMED - Abnormal; Notable for the following:    Opiates POSITIVE (*)    All other components within normal limits  CK - Abnormal; Notable for the following:    Total CK 50,000 (*)    All other components within normal limits  URINALYSIS, ROUTINE W REFLEX MICROSCOPIC (NOT [facility]) - Abnormal; Notable for the following:    Color, Urine BROWN (*)    APPearance TURBID (*)    Hgb urine dipstick LARGE (*)    Bilirubin Urine LARGE (*)    Ketones, ur 15 (*)    Protein, ur 100 (*)    Nitrite POSITIVE (*)    Leukocytes, UA SMALL (*)    All other components within normal limits  URINE MICROSCOPIC-ADD ON - Abnormal; Notable for the following:    Squamous Epithelial / LPF 6-30 (*)    Bacteria, UA MANY (*)    Casts GRANULAR CAST (*)    All other components within normal limits  ACETAMINOPHEN LEVEL - Abnormal; Notable for the following:    Acetaminophen (Tylenol), Serum <10 (*)    All other components within normal limits  I-STAT CG4 LACTIC ACID, ED - Abnormal; Notable for the following:    Lactic Acid, Venous 2.54 (*)    All other components within normal limits  CBG MONITORING, ED - Abnormal; Notable for the following:    Glucose-Capillary 276 (*)    All other components within normal limits  URINE CULTURE  ETHANOL  COMPREHENSIVE METABOLIC PANEL  HIV ANTIBODY (ROUTINE TESTING)  HEPATITIS C ANTIBODY (REFLEX)  HCV RNA QUANT RFLX ULTRA OR GENOTYP  HEPATITIS A ANTIBODY, IGM  HEPATITIS B SURFACE ANTIGEN  HEPATITIS B CORE ANTIBODY, IGM  HEPATITIS B SURFACE ANTIBODY  HEMOGLOBIN A1C  PROTIME-INR  I-STAT BETA HCG  BLOOD, ED ([facility], [facility], [facility] ONLY)  I-STAT CG4 LACTIC ACID, ED    EKG  EKG Interpretation  Date/Time:  Thursday January 23 2016 06:29:33 EST Ventricular Rate:  99 PR Interval:    QRS Duration: 83 QT Interval:  374 QTC Calculation: 480 R Axis:   56 Text Interpretation:  Normal sinus rhythm  No significant change since last tracing Confirmed by Erroll Luna [phone]) on 01/23/2016 6:39:37 AM       Radiology Dg Chest Portable 1 View  Result Date: 01/23/2016 CLINICAL DATA:  Hypotension. EXAM: PORTABLE CHEST 1 VIEW COMPARISON:  05/24/2011 FINDINGS: Extensive airspace disease left upper lobe and left lower lobe with air bronchograms. No significant effusion. Mild right lower lobe atelectasis. Decreased lung volume. Heart size within normal limits. IMPRESSION: Extensive infiltrate in the left upper lobe and left lower lobe most consistent with pneumonia. Electronically Signed   By: Marlan Palau M.D.   On: 01/23/2016 07:28    Procedures Procedures (including critical care time)  CRITICAL CARE Performed by: Cheri Fowler   Total critical care time: 30 minutes  Critical care time was exclusive of separately billable procedures and treating other patients.  Critical care was necessary to treat or prevent imminent or life-threatening deterioration.  Critical care was time spent personally by me on the following activities: development of treatment plan with patient and/or surrogate as well as nursing, discussions with consultants, evaluation of patient's response to treatment, examination of patient, obtaining history from patient or surrogate, ordering and performing treatments and interventions, ordering and review of laboratory studies, ordering and review of radiographic studies, pulse oximetry and re-evaluation of patient's condition.   Medications Ordered in ED Medications  vancomycin (VANCOCIN) 1,500 mg in sodium chloride 0.9 % 500 mL IVPB (1,500 mg Intravenous New Bag/Given  01/23/16 0825)  heparin injection 5,000 Units (not administered)  sodium chloride flush (NS) 0.9 % injection 3 mL (not administered)  traMADol (ULTRAM) tablet 50 mg (50 mg Oral Given 01/23/16 0928)  senna-docusate (Senokot-S) tablet 1 tablet (not administered)  promethazine (PHENERGAN) tablet 12.5 mg (12.5 mg Oral Given 01/23/16 0927)  0.9 %  sodium chloride infusion (not administered)  methocarbamol (ROBAXIN) tablet 1,500 mg (not administered)  sodium chloride 0.9 % bolus 2,000 mL (2,000 mLs Intravenous New Bag/Given 01/23/16 0605)  ceFEPIme (MAXIPIME) 2 g in dextrose 5 % 50 mL IVPB (0 g Intravenous Stopped 01/23/16 0815)  dextrose 50 % solution 100 mL (100 mLs Intravenous Given 01/23/16 0634)  insulin aspart (novoLOG) injection 10 Units (10 Units Intravenous Given 01/23/16 0634)  sodium bicarbonate injection 150 mEq (150 mEq Intravenous Given 01/23/16 0642)  sodium chloride 0.9 % bolus 2,000 mL (0 mLs Intravenous Stopped 01/23/16 0730)  calcium gluconate 2 g in sodium chloride 0.9 % 100 mL IVPB (0 g Intravenous Stopped 01/23/16 0815)     Initial Impression / Assessment and Plan / ED Course  I have reviewed the triage vital signs and the nursing notes.  Pertinent labs & imaging results that were available during my care of the patient were reviewed by me and considered in my medical decision making (see chart for details).  Clinical Course    Patient presents with muscle weakness after lying on the floor for an extended period of time.  She appears to have AKI, hyperkalemia, and leukocytosis.  CK pending at this time.  Lactic acid 2.54. Consider epidural abscess, but less likely given no fever, back pain, and significant lab abnormalities.  High suspicion for rhabdomyolysis with possible sepsis with UTI and aspiration PNA.  However, I believe hypotension and lactic acid are secondary to rhabdomyolysis over sepsis; therefore, code sepsis was not initiated.  She was placed on 2L [place] due to  hypoxia secondary to aspiration PNA.  Patient given 4L fluids, Vanc and Cefepime, along with calcium, insulin, sodium bicarb, and D50.  BP  responsive to fluids and pressors were withheld. She will need admission for further management, appreciate internal medicine.  Case has been discussed with and seen by Dr. Mora Bellman who agrees with the above plan for admission.   Final Clinical Impressions(s) / ED Diagnoses   Final diagnoses:  Heroin use  AKI (acute kidney injury) (HCC)  Hyperkalemia  Hypotension, unspecified hypotension type  Aspiration pneumonia of left lung, unspecified aspiration pneumonia type, unspecified part of lung [facility])  Non-traumatic rhabdomyolysis    New Prescriptions New Prescriptions   No medications on file         Cheri Fowler, PA-C 01/23/16 1005    Tomasita Crumble, MD 01/23/16 2309

## 2016-01-24 ENCOUNTER — Inpatient Hospital Stay (HOSPITAL_COMMUNITY): Payer: Self-pay

## 2016-01-24 DIAGNOSIS — B1920 Unspecified viral hepatitis C without hepatic coma: Secondary | ICD-10-CM

## 2016-01-24 DIAGNOSIS — R740 Nonspecific elevation of levels of transaminase and lactic acid dehydrogenase [LDH]: Secondary | ICD-10-CM

## 2016-01-24 DIAGNOSIS — N179 Acute kidney failure, unspecified: Secondary | ICD-10-CM

## 2016-01-24 DIAGNOSIS — M6282 Rhabdomyolysis: Secondary | ICD-10-CM

## 2016-01-24 HISTORY — DX: Acute kidney failure, unspecified: N17.9

## 2016-01-24 HISTORY — DX: Rhabdomyolysis: M62.82

## 2016-01-24 LAB — CBC
HCT: 33 % — ABNORMAL LOW (ref 36.0–46.0)
HEMOGLOBIN: 11.2 g/dL — AB (ref 12.0–15.0)
MCH: 30.7 pg (ref 26.0–34.0)
MCHC: 33.9 g/dL (ref 30.0–36.0)
MCV: 90.4 fL (ref 78.0–100.0)
Platelets: 147 10*3/uL — ABNORMAL LOW (ref 150–400)
RBC: 3.65 MIL/uL — AB (ref 3.87–5.11)
RDW: 13.4 % (ref 11.5–15.5)
WBC: 12.2 10*3/uL — AB (ref 4.0–10.5)

## 2016-01-24 LAB — COMMENT2 - HEP PANEL

## 2016-01-24 LAB — BASIC METABOLIC PANEL
ANION GAP: 7 (ref 5–15)
BUN: 43 mg/dL — ABNORMAL HIGH (ref 6–20)
CALCIUM: 5.7 mg/dL — AB (ref 8.9–10.3)
CO2: 18 mmol/L — AB (ref 22–32)
CREATININE: 3.91 mg/dL — AB (ref 0.44–1.00)
Chloride: 112 mmol/L — ABNORMAL HIGH (ref 101–111)
GFR, EST AFRICAN AMERICAN: 17 mL/min — AB (ref >60)
GFR, EST NON AFRICAN AMERICAN: 14 mL/min — AB (ref >60)
Glucose, Bld: 99 mg/dL (ref 65–99)
Potassium: 4.5 mmol/L (ref 3.5–5.1)
Sodium: 137 mmol/L (ref 135–145)

## 2016-01-24 LAB — COMPREHENSIVE METABOLIC PANEL
ALBUMIN: 2.4 g/dL — AB (ref 3.5–5.0)
ALK PHOS: 54 U/L (ref 38–126)
ALT: 648 U/L — AB (ref 14–54)
ALT: 583 U/L — ABNORMAL HIGH (ref 14–54)
ANION GAP: 10 (ref 5–15)
AST: 1662 U/L — AB (ref 15–41)
AST: 1500 U/L — ABNORMAL HIGH (ref 15–41)
Albumin: 2 g/dL — ABNORMAL LOW (ref 3.5–5.0)
Alkaline Phosphatase: 62 U/L (ref 38–126)
Anion gap: 9 (ref 5–15)
BUN: 36 mg/dL — AB (ref 6–20)
BUN: 40 mg/dL — ABNORMAL HIGH (ref 6–20)
CALCIUM: 5.6 mg/dL — AB (ref 8.9–10.3)
CHLORIDE: 107 mmol/L (ref 101–111)
CO2: 21 mmol/L — AB (ref 22–32)
CO2: 20 mmol/L — AB (ref 22–32)
CREATININE: 2.85 mg/dL — AB (ref 0.44–1.00)
Calcium: 5.7 mg/dL — CL (ref 8.9–10.3)
Chloride: 108 mmol/L (ref 101–111)
Creatinine, Ser: 3.36 mg/dL — ABNORMAL HIGH (ref 0.44–1.00)
GFR calc Af Amer: 24 mL/min — ABNORMAL LOW (ref >60)
GFR calc non Af Amer: 21 mL/min — ABNORMAL LOW (ref >60)
GFR, EST AFRICAN AMERICAN: 20 mL/min — AB (ref >60)
GFR, EST NON AFRICAN AMERICAN: 17 mL/min — AB (ref >60)
GLUCOSE: 120 mg/dL — AB (ref 65–99)
Glucose, Bld: 102 mg/dL — ABNORMAL HIGH (ref 65–99)
POTASSIUM: 4.4 mmol/L (ref 3.5–5.1)
Potassium: 4.5 mmol/L (ref 3.5–5.1)
SODIUM: 137 mmol/L (ref 135–145)
SODIUM: 138 mmol/L (ref 135–145)
TOTAL PROTEIN: 4.4 g/dL — AB (ref 6.5–8.1)
Total Bilirubin: 0.5 mg/dL (ref 0.3–1.2)
Total Bilirubin: 0.6 mg/dL (ref 0.3–1.2)
Total Protein: 4.9 g/dL — ABNORMAL LOW (ref 6.5–8.1)

## 2016-01-24 LAB — CK: Total CK: >50000 U/L — ABNORMAL HIGH (ref 38–234)

## 2016-01-24 LAB — URIC ACID: URIC ACID, SERUM: 8.6 mg/dL — AB (ref 2.3–6.6)

## 2016-01-24 LAB — HEPATITIS B CORE ANTIBODY, IGM: HEP B C IGM: NEGATIVE

## 2016-01-24 LAB — HIV ANTIBODY (ROUTINE TESTING W REFLEX): HIV Screen 4th Generation wRfx: NONREACTIVE

## 2016-01-24 LAB — HEPATITIS B SURFACE ANTIBODY,QUALITATIVE: HEP B S AB: REACTIVE

## 2016-01-24 LAB — HEPATITIS A ANTIBODY, IGM: HEP A IGM: NEGATIVE

## 2016-01-24 LAB — URINE CULTURE

## 2016-01-24 LAB — PHOSPHORUS: PHOSPHORUS: 4.2 mg/dL (ref 2.5–4.6)

## 2016-01-24 LAB — HEPATITIS C ANTIBODY (REFLEX)

## 2016-01-24 LAB — MAGNESIUM: Magnesium: 2 mg/dL (ref 1.7–2.4)

## 2016-01-24 LAB — HEPATITIS B SURFACE ANTIGEN: Hepatitis B Surface Ag: NEGATIVE

## 2016-01-24 MED ORDER — OXYCODONE HCL 5 MG PO TABS
ORAL_TABLET | ORAL | Status: AC
  Filled 2016-01-24: qty 1

## 2016-01-24 MED ORDER — TRAMADOL HCL 50 MG PO TABS: 50.0000 mg | ORAL_TABLET | Freq: Three times a day (TID) | ORAL | Status: DC | PRN

## 2016-01-24 MED ORDER — OXYCODONE HCL 5 MG PO TABS
5.0000 mg | ORAL_TABLET | Freq: Once | ORAL | Status: AC
  Administered 2016-01-24: 5 mg via ORAL

## 2016-01-24 MED ORDER — SODIUM CHLORIDE 0.9 % IV SOLN
INTRAVENOUS | Status: DC
  Administered 2016-01-24 (×2): via INTRAVENOUS

## 2016-01-24 MED ORDER — TRAMADOL HCL 50 MG PO TABS
100.0000 mg | ORAL_TABLET | Freq: Two times a day (BID) | ORAL | Status: DC | PRN
  Administered 2016-01-24 – 2016-01-25 (×3): 100 mg via ORAL
  Filled 2016-01-24 (×3): qty 2

## 2016-01-24 NOTE — Progress Notes (Signed)
Subjective: Patient complaining of severe pain in her R thigh and all over her body. Reports she urinated a very small amount earlier this morning.   Objective:  Vital signs in last 24 hours: Vitals:   01/23/16 2300 01/24/16 0400 01/24/16 0433 01/24/16 0808  BP: 114/80 123/88  (!) 135/98  Pulse: 91 87  97  Resp: 17 13  17   Temp: 99 F (37.2 C) 98.4 F (36.9 C)  98.1 F (36.7 C)  TempSrc: Oral Oral  Oral  SpO2: 93% 97%  97%  Weight:   180 lb 8.9 oz (81.9 kg)   Height:       Physical Exam Constitutional: appears uncomfortable  Cardiovascular: RRR, no murmurs, rubs, or gallops.  Pulmonary/Chest: CTAB, no wheezes, rales, or rhonchi.  Abdominal: Soft, non tender, non distended. +BS.  Extremities: Right thigh hematoma with swelling and erythema, right lower extremity with decreased sensation to light tough, motor function intact. Warm and well perfused, distal pulses 2+ bilaterally.  Neurological: A&Ox3, CN II - XII grossly intact.  Skin: No rashes or erythema  Psychiatric: Normal mood and affect  Assessment/Plan:  Rhabdomyolysis: Patient presented after a fall with LOC and was found down by her roommate after 6 hours. CK persistently elevated > 50,000 today. She received 4L of NS in ED and 350 cc/hr overnight, net positive ~9.5L this morning with minimal UOP of 250 cc.   -- Continue NS @350cc /hr -- Strict I/Os, daily weights -- Trending CK q6hrs  -- Repeat CMP this afternoon  -- Increased tramadol 100 mg Q12hrs for pain; pain control measures limited due to renal and hepatic dysfunction  AKI: With oliguria (UOP 250cc / 24 hrs).  She has received almost 10L NS and creatinine continues to rise 2.26 on admission to 3.36 today. Likely ATN secondary to rhabdo. Baseline creatinine 0.7 in 2016.  -- Nephrology consult  -- Continue fluids  -- Avoid nephrotoxic meds  Acute Hepatitis: In the setting of IV drug use and rhabdomyolysis. AST and ALT now down trending 1900 -> 1500 and 705  --> 583 respectively. Hep C ab positive, viral load pending. Likely multifactorial due to viral hepatitis and shock liver.  -- f/u Hep C viral load  -- Avoid hepatotoxic meds  R Thigh Hematoma: No signs of compartment syndrome -- Continue to monitor   Hyperkalemia: Resolved (6.2 -> 5.7 -> 4.4 -> 4.5) s/p 2g calcium gluconate, 10u Novolog w/ D50 in the ED. EKG without peaked T waves, patient denies chest pain.  -- daily labs  Hypocalcemia: Unchanged at 5.6. Likely secondary to Rhabdomyolysis.  -- Mag level pending   Abnormal UA: UA with many bacteria, small leukocytes, positive ntirite, 6-30 squamous epithelial cells. Consistent with a non clean catch sample. She is asymptomatic. She was given vanc and cefepime in the ED.  -- Discontinue abx  Aspiration pneumonitis: She denies fevers or chills but does have a productive cough. She is unable to describe her sputum quality. CXR with extensive infiltrate in left upper lobe and left lower lobe. Concern for aspiration given her loss of consciousness with her heroin use. WBC is normal. -- Continue to observe for now. Would consider antibiotics after about 48 hours if she fails to improve or worsens.  Opioid use disorder:  -- HIV pending -- May consider Suboxone as outpatient  Hyperglycemia: Glucose 216 prior to dextrose administration. A1c 5.4  FEN: 250 cc/hr, monitoring electrolytes, regular diet VTE ppx: Heparin Code Status: FULL   Dispo: Anticipated discharge in approximately  2-3 day(s) pending improvement in CK and renal function.   Alejandra Pollarolyn Jeevan Kalla, MD 01/24/2016, 8:20 AM Pager: [phone]

## 2016-01-24 NOTE — Consult Note (Signed)
Reason for Consult:AKI, rhabomyolysis Referring Physician: Dr. Boykin Alejandra Park is an 30 y.o. female.  HPI: 30 yr female with hx substance abuse, depression used heroin 2d ago, had a fall and went to bed. Found by roommate could not walk, and trouble using legs.  No hx renal dz or infx but hx using NSAIDS on reg basis.  Main c/o now is pain in buttocks, upper legs. Cr on admit 2.3 and now 3.9.   Had 100 cc urine yest, and had 600 cc in bladder now with I&O cath.  Cannot void.  Given over 11 L of fluid so far. Constitutional: miserable , esp pain as listed above, chilling Eyes: negative Ears, nose, mouth, throat, and face: negative Respiratory: negative Cardiovascular: severe swelling Gastrointestinal: has thrown up Genitourinary:cannot void Integument/breast: bruise upper thigh on R Musculoskeletal:as above Behavioral/Psych: depression Allergic/Immunologic: negative    Past Medical History:  Diagnosis Date  . Bulging disc   . Chronic back pain   . Fracture of sternum   . Umbilical hernia     Past Surgical History:  Procedure Laterality Date  . KNEE SURGERY    . TUBAL LIGATION      No family history on file.  Social History:  reports that she has been smoking.  She does not have any smokeless tobacco history on file. She reports that she drinks alcohol. She reports that she uses drugs, including IV.  Allergies: No Known Allergies  Medications:  I have reviewed the patient's current medications. Prior to Admission:  No prescriptions prior to admission.    Results for orders placed or performed during the hospital encounter of 01/23/16 (from the past 48 hour(s))  Ethanol     Status: None   Collection Time: 01/23/16  4:50 AM  Result Value Ref Range   Alcohol, Ethyl (B) <5 <5 mg/dL    Comment:        LOWEST DETECTABLE LIMIT FOR SERUM ALCOHOL IS 5 mg/dL FOR MEDICAL PURPOSES ONLY   Acetaminophen level     Status: Abnormal   Collection Time: 01/23/16  4:50 AM   Result Value Ref Range   Acetaminophen (Tylenol), Serum <10 (L) 10 - 30 ug/mL    Comment:        THERAPEUTIC CONCENTRATIONS VARY SIGNIFICANTLY. A RANGE OF 10-30 ug/mL MAY BE AN EFFECTIVE CONCENTRATION FOR MANY PATIENTS. HOWEVER, SOME ARE BEST TREATED AT CONCENTRATIONS OUTSIDE THIS RANGE. ACETAMINOPHEN CONCENTRATIONS >150 ug/mL AT 4 HOURS AFTER INGESTION AND >50 ug/mL AT 12 HOURS AFTER INGESTION ARE OFTEN ASSOCIATED WITH TOXIC REACTIONS.   CBC with Differential     Status: Abnormal   Collection Time: 01/23/16  4:51 AM  Result Value Ref Range   WBC 14.1 (H) 4.0 - 10.5 K/uL   RBC 5.12 (H) 3.87 - 5.11 MIL/uL   Hemoglobin 15.8 (H) 12.0 - 15.0 g/dL   HCT 47.1 (H) 36.0 - 46.0 %   MCV 92.0 78.0 - 100.0 fL   MCH 30.9 26.0 - 34.0 pg   MCHC 33.5 30.0 - 36.0 g/dL   RDW 13.1 11.5 - 15.5 %   Platelets 232 150 - 400 K/uL   Neutrophils Relative % 87 %   Neutro Abs 12.3 (H) 1.7 - 7.7 K/uL   Lymphocytes Relative 6 %   Lymphs Abs 0.9 0.7 - 4.0 K/uL   Monocytes Relative 7 %   Monocytes Absolute 1.0 0.1 - 1.0 K/uL   Eosinophils Relative 0 %   Eosinophils Absolute 0.0 0.0 - 0.7 K/uL  Basophils Relative 0 %   Basophils Absolute 0.0 0.0 - 0.1 K/uL  Comprehensive metabolic panel     Status: Abnormal   Collection Time: 01/23/16  4:51 AM  Result Value Ref Range   Sodium 135 135 - 145 mmol/L   Potassium 6.2 (H) 3.5 - 5.1 mmol/L   Chloride 101 101 - 111 mmol/L   CO2 22 22 - 32 mmol/L   Glucose, Bld 216 (H) 65 - 99 mg/dL   BUN 29 (H) 6 - 20 mg/dL   Creatinine, Ser 2.26 (H) 0.44 - 1.00 mg/dL   Calcium 6.8 (L) 8.9 - 10.3 mg/dL   Total Protein 6.5 6.5 - 8.1 g/dL   Albumin 3.2 (L) 3.5 - 5.0 g/dL   AST 1,938 (H) 15 - 41 U/L   ALT 705 (H) 14 - 54 U/L   Alkaline Phosphatase 85 38 - 126 U/L   Total Bilirubin 0.4 0.3 - 1.2 mg/dL   GFR calc non Af Amer 28 (L) >60 mL/min   GFR calc Af Amer 32 (L) >60 mL/min    Comment: (NOTE) The eGFR has been calculated using the CKD EPI equation. This  calculation has not been validated in all clinical situations. eGFR's persistently <60 mL/min signify possible Chronic Kidney Disease.    Anion gap 12 5 - 15  CK     Status: Abnormal   Collection Time: 01/23/16  4:51 AM  Result Value Ref Range   Total CK 50,000 (H) 38 - 234 U/L    Comment: RESULTS CONFIRMED BY MANUAL DILUTION  I-Stat Beta hCG blood, ED ([facility], [facility], [facility] only)     Status: None   Collection Time: 01/23/16  4:57 AM  Result Value Ref Range   I-stat hCG, quantitative <5.0 <5 mIU/mL   Comment 3            Comment:   GEST. AGE      CONC.  (mIU/mL)   <=1 WEEK        5 - 50     2 WEEKS       50 - 500     3 WEEKS       100 - 10,000     4 WEEKS     1,000 - 30,000        FEMALE AND NON-PREGNANT FEMALE:     LESS THAN 5 mIU/mL   Rapid urine drug screen (hospital performed)     Status: Abnormal   Collection Time: 01/23/16  5:08 AM  Result Value Ref Range   Opiates POSITIVE (A) NONE DETECTED   Cocaine NONE DETECTED NONE DETECTED   Benzodiazepines NONE DETECTED NONE DETECTED   Amphetamines NONE DETECTED NONE DETECTED   Tetrahydrocannabinol NONE DETECTED NONE DETECTED   Barbiturates NONE DETECTED NONE DETECTED    Comment:        DRUG SCREEN FOR MEDICAL PURPOSES ONLY.  IF CONFIRMATION IS NEEDED FOR ANY PURPOSE, NOTIFY LAB WITHIN 5 DAYS.        LOWEST DETECTABLE LIMITS FOR URINE DRUG SCREEN Drug Class       Cutoff (ng/mL) Amphetamine      1000 Barbiturate      200 Benzodiazepine   932 Tricyclics       355 Opiates          300 Cocaine          300 THC              50   Urinalysis, Routine w reflex microscopic (  not at [facility])     Status: Abnormal   Collection Time: 01/23/16  5:08 AM  Result Value Ref Range   Color, Urine BROWN (A) YELLOW    Comment: BIOCHEMICALS MAY BE AFFECTED BY COLOR   APPearance TURBID (A) CLEAR   Specific Gravity, Urine 1.021 1.005 - 1.030   pH 5.0 5.0 - 8.0   Glucose, UA NEGATIVE NEGATIVE mg/dL   Hgb urine dipstick LARGE (A) NEGATIVE   Bilirubin  Urine LARGE (A) NEGATIVE   Ketones, ur 15 (A) NEGATIVE mg/dL   Protein, ur 100 (A) NEGATIVE mg/dL   Nitrite POSITIVE (A) NEGATIVE   Leukocytes, UA SMALL (A) NEGATIVE  Urine microscopic-add on     Status: Abnormal   Collection Time: 01/23/16  5:08 AM  Result Value Ref Range   Squamous Epithelial / LPF 6-30 (A) NONE SEEN   WBC, UA 6-30 0 - 5 WBC/hpf   RBC / HPF 6-30 0 - 5 RBC/hpf   Bacteria, UA MANY (A) NONE SEEN   Casts GRANULAR CAST (A) NEGATIVE   Urine-Other MUCOUS PRESENT   Urine culture     Status: Abnormal   Collection Time: 01/23/16  5:08 AM  Result Value Ref Range   Specimen Description URINE, RANDOM    Special Requests NONE    Culture <10,000 COLONIES/mL INSIGNIFICANT GROWTH (A)    Report Status 01/24/2016 FINAL   I-Stat CG4 Lactic Acid, ED     Status: Abnormal   Collection Time: 01/23/16  7:03 AM  Result Value Ref Range   Lactic Acid, Venous 2.54 (HH) 0.5 - 1.9 mmol/L   Comment NOTIFIED PHYSICIAN   CBG monitoring, ED     Status: Abnormal   Collection Time: 01/23/16  8:07 AM  Result Value Ref Range   Glucose-Capillary 276 (H) 65 - 99 mg/dL  Hepatitis c antibody (reflex)     Status: Abnormal   Collection Time: 01/23/16  8:22 AM  Result Value Ref Range   HCV Ab >11.0 (H) 0.0 - 0.9 s/co ratio    Comment: (NOTE) Performed At: [facility] [address] [place], [place] [id number] Lindon Romp MD HW:[id number]   Hepatitis A antibody, IgM     Status: None   Collection Time: 01/23/16  8:22 AM  Result Value Ref Range   Hep A IgM Negative Negative    Comment: (NOTE) Performed At: [facility] [place], [place] [id number] Lindon Romp MD ZW:[id number]   Hepatitis B surface antigen     Status: None   Collection Time: 01/23/16  8:22 AM  Result Value Ref Range   Hepatitis B Surface Ag Negative Negative    Comment: (NOTE) Performed At: [facility] [address] [place], [place] [id number] Lindon Romp MD  [id number]   Hepatitis B core antibody, IgM     Status: None   Collection Time: 01/23/16  8:22 AM  Result Value Ref Range   Hep B C IgM Negative Negative    Comment: (NOTE) Performed At: [facility] [place], [place] [id number] Lindon Romp MD PY:[id number]   Hepatitis B surface antibody     Status: None   Collection Time: 01/23/16  8:22 AM  Result Value Ref Range   Hep B S Ab Reactive     Comment: (NOTE)              Non Reactive: Inconsistent with immunity,  less than 10 mIU/mL              Reactive:     Consistent with immunity,                            greater than 9.9 mIU/mL Performed At: [facility] [place], [place] [id number] Lindon Romp MD SJ:[id number]   Comment2 - Hep panel     Status: None   Collection Time: 01/23/16  8:22 AM  Result Value Ref Range   COMMENT HCV-2 Comment     Comment: (NOTE) Strong reactive antibody screen (s/c ratio >10.9) is consistent with past or present HCV infection.  Follow-up testing by HCV, Quantitative, Real time PCR (#[id number]) is recommended to determine viral load/diagnosis of current HCV infection. Performed At: [facility] [place], [place] [id number] Lindon Romp MD SF:[id number]   Hemoglobin A1c     Status: None   Collection Time: 01/23/16  9:13 AM  Result Value Ref Range   Hgb A1c MFr Bld 5.4 4.8 - 5.6 %    Comment: (NOTE)         Pre-diabetes: 5.7 - 6.4         Diabetes: >6.4         Glycemic control for adults with diabetes: <7.0    Mean Plasma Glucose 108 mg/dL    Comment: (NOTE) Performed At: [facility] [place], [place] [id number] Lindon Romp MD RF:[id number]   Comprehensive metabolic panel     Status: Abnormal   Collection Time: 01/23/16  1:30 PM  Result Value Ref Range   Sodium 136 135 - 145 mmol/L   Potassium 5.7 (H) 3.5 - 5.1 mmol/L   Chloride 108 101 - 111 mmol/L    CO2 21 (L) 22 - 32 mmol/L   Glucose, Bld 96 65 - 99 mg/dL   BUN 32 (H) 6 - 20 mg/dL   Creatinine, Ser 2.30 (H) 0.44 - 1.00 mg/dL   Calcium 5.6 (LL) 8.9 - 10.3 mg/dL    Comment: CRITICAL RESULT CALLED TO, READ BACK BY AND VERIFIED WITH: GARNER,S. RN @ [phone] 01/23/16 BY EDENS,C.    Total Protein 4.7 (L) 6.5 - 8.1 g/dL   Albumin 2.2 (L) 3.5 - 5.0 g/dL   AST 1,706 (H) 15 - 41 U/L   ALT 625 (H) 14 - 54 U/L   Alkaline Phosphatase 55 38 - 126 U/L   Total Bilirubin 0.4 0.3 - 1.2 mg/dL   GFR calc non Af Amer 27 (L) >60 mL/min   GFR calc Af Amer 31 (L) >60 mL/min    Comment: (NOTE) The eGFR has been calculated using the CKD EPI equation. This calculation has not been validated in all clinical situations. eGFR's persistently <60 mL/min signify possible Chronic Kidney Disease.    Anion gap 7 5 - 15  HIV antibody     Status: None   Collection Time: 01/23/16  1:30 PM  Result Value Ref Range   HIV Screen 4th Generation wRfx Non Reactive Non Reactive    Comment: (NOTE) Performed At: [facility] [place], [place] [id number] Lindon Romp MD SP:[id number]   Protime-INR     Status: None   Collection Time: 01/23/16  1:30 PM  Result Value Ref Range   Prothrombin Time 14.9 11.4 - 15.2 seconds   INR 1.16   I-Stat CG4 Lactic Acid, ED  Status: None   Collection Time: 01/23/16  1:54 PM  Result Value Ref Range   Lactic Acid, Venous 1.46 0.5 - 1.9 mmol/L  MRSA PCR Screening     Status: Abnormal   Collection Time: 01/23/16  6:23 PM  Result Value Ref Range   MRSA by PCR POSITIVE (A) NEGATIVE    Comment:        The GeneXpert MRSA Assay (FDA approved for NASAL specimens only), is one component of a comprehensive MRSA colonization surveillance program. It is not intended to diagnose MRSA infection nor to guide or monitor treatment for MRSA infections. CRITICAL RESULT CALLED TO, READ BACK BY AND VERIFIED WITH: C. WOODARD, RN AT 2237 ON 01/23/16 BY C. JESSUP, MLT.    Comprehensive metabolic panel     Status: Abnormal   Collection Time: 01/23/16  6:43 PM  Result Value Ref Range   Sodium 137 135 - 145 mmol/L   Potassium 4.4 3.5 - 5.1 mmol/L    Comment: DELTA CHECK NOTED   Chloride 107 101 - 111 mmol/L   CO2 21 (L) 22 - 32 mmol/L   Glucose, Bld 120 (H) 65 - 99 mg/dL   BUN 36 (H) 6 - 20 mg/dL   Creatinine, Ser 2.85 (H) 0.44 - 1.00 mg/dL   Calcium 5.7 (LL) 8.9 - 10.3 mg/dL    Comment: CRITICAL RESULT CALLED TO, READ BACK BY AND VERIFIED WITH: [facility] RN 01/24/2016 0005 JORDANS    Total Protein 4.9 (L) 6.5 - 8.1 g/dL   Albumin 2.4 (L) 3.5 - 5.0 g/dL   AST 1,662 (H) 15 - 41 U/L   ALT 648 (H) 14 - 54 U/L   Alkaline Phosphatase 62 38 - 126 U/L   Total Bilirubin 0.5 0.3 - 1.2 mg/dL   GFR calc non Af Amer 21 (L) >60 mL/min   GFR calc Af Amer 24 (L) >60 mL/min    Comment: (NOTE) The eGFR has been calculated using the CKD EPI equation. This calculation has not been validated in all clinical situations. eGFR's persistently <60 mL/min signify possible Chronic Kidney Disease.    Anion gap 9 5 - 15  CK     Status: Abnormal   Collection Time: 01/23/16  6:43 PM  Result Value Ref Range   Total CK >50,000 (H) 38 - 234 U/L    Comment: RESULTS CONFIRMED BY MANUAL DILUTION  Comprehensive metabolic panel     Status: Abnormal   Collection Time: 01/24/16  2:43 AM  Result Value Ref Range   Sodium 138 135 - 145 mmol/L   Potassium 4.5 3.5 - 5.1 mmol/L   Chloride 108 101 - 111 mmol/L   CO2 20 (L) 22 - 32 mmol/L   Glucose, Bld 102 (H) 65 - 99 mg/dL   BUN 40 (H) 6 - 20 mg/dL   Creatinine, Ser 3.36 (H) 0.44 - 1.00 mg/dL   Calcium 5.6 (LL) 8.9 - 10.3 mg/dL    Comment: CRITICAL RESULT CALLED TO, READ BACK BY AND VERIFIED WITH: [facility] RN 01/24/2016 0522 JORDANS    Total Protein 4.4 (L) 6.5 - 8.1 g/dL   Albumin 2.0 (L) 3.5 - 5.0 g/dL   AST 1,500 (H) 15 - 41 U/L   ALT 583 (H) 14 - 54 U/L   Alkaline Phosphatase 54 38 - 126 U/L   Total Bilirubin 0.6 0.3 - 1.2  mg/dL   GFR calc non Af Amer 17 (L) >60 mL/min   GFR calc Af Amer 20 (L) >60 mL/min  Comment: (NOTE) The eGFR has been calculated using the CKD EPI equation. This calculation has not been validated in all clinical situations. eGFR's persistently <60 mL/min signify possible Chronic Kidney Disease.    Anion gap 10 5 - 15  CBC     Status: Abnormal   Collection Time: 01/24/16  2:43 AM  Result Value Ref Range   WBC 12.2 (H) 4.0 - 10.5 K/uL   RBC 3.65 (L) 3.87 - 5.11 MIL/uL   Hemoglobin 11.2 (L) 12.0 - 15.0 g/dL    Comment: REPEATED TO VERIFY SPECIMEN CHECKED FOR CLOTS DELTA CHECK NOTED    HCT 33.0 (L) 36.0 - 46.0 %   MCV 90.4 78.0 - 100.0 fL   MCH 30.7 26.0 - 34.0 pg   MCHC 33.9 30.0 - 36.0 g/dL   RDW 13.4 11.5 - 15.5 %   Platelets 147 (L) 150 - 400 K/uL  CK     Status: Abnormal   Collection Time: 01/24/16  2:43 AM  Result Value Ref Range   Total CK >50,000 (H) 38 - 234 U/L    Comment: RESULTS CONFIRMED BY MANUAL DILUTION  Magnesium     Status: None   Collection Time: 01/24/16 12:03 PM  Result Value Ref Range   Magnesium 2.0 1.7 - 2.4 mg/dL  CK     Status: Abnormal   Collection Time: 01/24/16 12:36 PM  Result Value Ref Range   Total CK >50,000 (H) 38 - 234 U/L    Comment: RESULTS CONFIRMED BY MANUAL DILUTION  Basic metabolic panel     Status: Abnormal   Collection Time: 01/24/16 12:36 PM  Result Value Ref Range   Sodium 137 135 - 145 mmol/L   Potassium 4.5 3.5 - 5.1 mmol/L   Chloride 112 (H) 101 - 111 mmol/L   CO2 18 (L) 22 - 32 mmol/L   Glucose, Bld 99 65 - 99 mg/dL   BUN 43 (H) 6 - 20 mg/dL   Creatinine, Ser 3.91 (H) 0.44 - 1.00 mg/dL   Calcium 5.7 (LL) 8.9 - 10.3 mg/dL    Comment: CRITICAL RESULT CALLED TO, READ BACK BY AND VERIFIED WITH: M.RAND,RN 1328 01/24/16 CLARK,S    GFR calc non Af Amer 14 (L) >60 mL/min   GFR calc Af Amer 17 (L) >60 mL/min    Comment: (NOTE) The eGFR has been calculated using the CKD EPI equation. This calculation has not been  validated in all clinical situations. eGFR's persistently <60 mL/min signify possible Chronic Kidney Disease.    Anion gap 7 5 - 15    [place] Extrem Low Right Ltd  Result Date: 01/24/2016 CLINICAL DATA:  30 year old female status post fall 3 days ago with anterior thigh bruising. Query hematoma. Initial encounter. EXAM: ULTRASOUND RIGHT LOWER EXTREMITY LIMITED TECHNIQUE: Ultrasound examination of the lower extremity soft tissues was performed in the area of clinical concern. COMPARISON:  None. FINDINGS: Pearline Cables scale and Doppler ultrasound images in the area of clinical concern. Generalized subcutaneous edema. There is a trace amount of discrete fluid tracking between the fibromuscular bundles (image 13). There might also be areas of focal muscle bundle but edema (image 11 of the cine clip). No organized or drainable fluid collection or hematoma identified. IMPRESSION: Soft tissue injury and edema with no focal hematoma or drainable fluid collection identified. Electronically Signed   By: Genevie Ann M.D.   On: 01/24/2016 13:27   Dg Chest Portable 1 View  Result Date: 01/23/2016 CLINICAL DATA:  Hypotension. EXAM: PORTABLE CHEST 1 VIEW  COMPARISON:  05/24/2011 FINDINGS: Extensive airspace disease left upper lobe and left lower lobe with air bronchograms. No significant effusion. Mild right lower lobe atelectasis. Decreased lung volume. Heart size within normal limits. IMPRESSION: Extensive infiltrate in the left upper lobe and left lower lobe most consistent with pneumonia. Electronically Signed   By: Franchot Gallo M.D.   On: 01/23/2016 07:28    ROS Blood pressure (!) 131/91, pulse 76, temperature 98.7 F (37.1 C), temperature source Oral, resp. rate (!) 23, height _0  (1.702 m), weight 81.9 kg (180 lb 8.9 oz), SpO2 98 %. Physical Exam Physical Examination: General appearance - chilling , uncomfortable Mental status - anxious Eyes - pupils equal and reactive, extraocular eye movements intact,  funduscopic exam normal, discs flat and sharp Mouth - mucous membranes moist, pharynx normal without lesions Neck - adenopathy noted PCL Lymphatics - posterior cervical nodes Chest - decreased bs, rales in bases Heart - normal rate, regular rhythm, normal S1, S2, no murmurs, rubs, clicks or gallops Abdomen - striae, pos bs, soft, liver down 8-9 cm Musculoskeletal - tight skin and soft tiss in thighs, buttocks, 3+ edema,  Extremities - peripheral pulses normal, no pedal edema, no clubbing or cyanosis, pedal edema 3 + Skin - striae abdm, bruise R thigh, tight skin, shinny in thighs, hips  Assessment/Plan: 1 AKI from rhabdo.? Some component NSAID.  But most likely rhabdo alone.  Need to follow phos and bind if needed. Mild acidemia.  Use  Isotonic bicarb for ivf when poss . Now severely vol overloaded and in danger of compartment syndrome in injured ms.  Also not able to void and will place foley.  If no better will need dialysis in 24-48 h 2 Unable to void 3 vol xs 4. Substance abuse 5. ^ LFTs suspect is secondary to rhabdo P stop ivf, foley , follow chem , phos, follow status of thigh and pelvic ms for compartment syndrome  Alejandra Park L 01/24/2016, 5:50 PM

## 2016-01-24 NOTE — Evaluation (Signed)
Physical Therapy Evaluation Patient Details Name: Alejandra FerronRebecca D Nagorski MRN: [id number] DOB: [birth year] Today's Date: 01/24/2016   History of Present Illness  Pt adm with rhabdomyolosis after being on the floor at home for >6 hours after using heroin.  CK > 50,000. Pt also with AKI and acute hepatitis. PMH - substance abuse, chronic pain, depression  Clinical Impression  Pt admitted with above diagnosis and presents to PT with functional limitations due to deficits listed below (See PT problem list). Pt needs skilled PT to maximize independence and safety to allow discharge to possibly CIR. Pt with significant deficits in mobility due to weakness and pain in LE's. Pt will need to be much more independent to return to prior living situation.      Follow Up Recommendations CIR    Equipment Recommendations  Other (comment) (To be assessed)    Recommendations for Other Services OT consult;Rehab consult     Precautions / Restrictions Precautions Precautions: Fall Restrictions Weight Bearing Restrictions: No      Mobility  Bed Mobility Overal bed mobility: Needs Assistance Bed Mobility: Supine to Sit;Sit to Supine     Supine to sit: Mod assist Sit to supine: Mod assist   General bed mobility comments: Assist to bring legs off EOB to come to sitting and to bring legs back up on bed when returning to supine  Transfers                 General transfer comment: Unable to bring hips off bed to intitiate standing  Ambulation/Gait             General Gait Details: Unable  Stairs            Wheelchair Mobility    Modified Rankin (Stroke Patients Only)       Balance Overall balance assessment: Needs assistance Sitting-balance support: No upper extremity supported;Feet supported Sitting balance-Leahy Scale: Good                                       Pertinent Vitals/Pain Pain Assessment: 0-10 Pain Score: 10-Worst pain ever Pain Location: rt  and lt LE's and buttocks Pain Descriptors / Indicators: Constant Pain Intervention(s): Limited activity within patient's tolerance;Monitored during session;Repositioned    Home Living Family/patient expects to be discharged to:: Private residence Living Arrangements: Other (Comment) (roommate)   Type of Home: House Home Access: Level entry     Home Layout: One level Home Equipment: None      Prior Function Level of Independence: Independent               Hand Dominance        Extremity/Trunk Assessment   Upper Extremity Assessment: Overall WFL for tasks assessed           Lower Extremity Assessment: RLE deficits/detail;LLE deficits/detail RLE Deficits / Details: Strength for hip and knee 2-/5, ankle 3-/5 LLE Deficits / Details: Strength for hip and knee 2+/5, ankle 3/5     Communication   Communication: No difficulties  Cognition Arousal/Alertness: Awake/alert Behavior During Therapy: WFL for tasks assessed/performed Overall Cognitive Status: Within Functional Limits for tasks assessed                      General Comments      Exercises     Assessment/Plan    PT Assessment Patient needs continued PT services  PT Problem  List Decreased strength;Decreased activity tolerance;Decreased mobility;Decreased knowledge of use of DME;Pain          PT Treatment Interventions DME instruction;Gait training;Functional mobility training;Therapeutic activities;Therapeutic exercise;Patient/family education    PT Goals (Current goals can be found in the Care Plan section)  Acute Rehab PT Goals Patient Stated Goal: get better PT Goal Formulation: With patient Time For Goal Achievement: 02/07/16 Potential to Achieve Goals: Good    Frequency Min 3X/week   Barriers to discharge Decreased caregiver support      Co-evaluation               End of Session   Activity Tolerance: Patient tolerated treatment well Patient left: in bed;with call  bell/phone within reach;with bed alarm set           Time: [id number] PT Time Calculation (min) (ACUTE ONLY): 22 min   Charges:   PT Evaluation $PT Eval Moderate Complexity: 1 Procedure     PT G CodesAngelina Ok:        Jayden Kratochvil W Maycok 01/24/2016, 1:47 PM [employer] CorporationCary Veanna Dower PT [phone]

## 2016-01-24 NOTE — Progress Notes (Signed)
CRITICAL VALUE ALERT  Critical value received:  Calcium 5.7  Date of notification:  01/24/16  Time of notification:  13:27  Critical value read back:yes  Nurse who received alert:  Maggie  MD notified (1st page):  IMTS  Time of first page:  13:50

## 2016-01-24 NOTE — Progress Notes (Signed)
CRITICAL VALUE ALERT  Critical value received:  Calcium 5.7  Date of notification:  01/24/2016  Time of notification:  0010  Critical value read back:Yes.    Nurse who received alert:  Kristeen Misshris Nuala Chiles RN  MD notified (1st page):  IMTS   Time of first page:  0010  MD notified (2nd page):  Time of second page:  Responding MD:  IMTS  Time MD responded:  [phone]

## 2016-01-24 NOTE — Progress Notes (Signed)
Inpatient Rehabilitation  Per PT request patient was screened by Fae PippinMelissa Brittian Renaldo for appropriateness for an Inpatient Acute Rehab consult.  At this time we will follow along for therapy progress, tranfers, and ambulation to determine if IP Rehab is most appropriate rehab venue.  Please call with questions.   Charlane FerrettiMelissa Alee Gressman, M.A., CCC/SLP Admission Coordinator  [facility]  Cell [phone]

## 2016-01-25 ENCOUNTER — Inpatient Hospital Stay (HOSPITAL_COMMUNITY): Payer: Self-pay

## 2016-01-25 ENCOUNTER — Encounter (HOSPITAL_COMMUNITY): Payer: Self-pay | Admitting: Interventional Radiology

## 2016-01-25 DIAGNOSIS — T796XXS Traumatic ischemia of muscle, sequela: Secondary | ICD-10-CM

## 2016-01-25 DIAGNOSIS — N179 Acute kidney failure, unspecified: Secondary | ICD-10-CM

## 2016-01-25 HISTORY — PX: IR GENERIC HISTORICAL: IMG1180011

## 2016-01-25 LAB — COMPREHENSIVE METABOLIC PANEL
ALK PHOS: 55 U/L (ref 38–126)
ALT: 509 U/L — AB (ref 14–54)
AST: 914 U/L — ABNORMAL HIGH (ref 15–41)
Albumin: 1.9 g/dL — ABNORMAL LOW (ref 3.5–5.0)
Anion gap: 9 (ref 5–15)
BUN: 51 mg/dL — ABNORMAL HIGH (ref 6–20)
CALCIUM: 6.4 mg/dL — AB (ref 8.9–10.3)
CO2: 17 mmol/L — AB (ref 22–32)
CREATININE: 5.21 mg/dL — AB (ref 0.44–1.00)
Chloride: 113 mmol/L — ABNORMAL HIGH (ref 101–111)
GFR, EST AFRICAN AMERICAN: 12 mL/min — AB (ref >60)
GFR, EST NON AFRICAN AMERICAN: 10 mL/min — AB (ref >60)
Glucose, Bld: 87 mg/dL (ref 65–99)
Potassium: 4.4 mmol/L (ref 3.5–5.1)
Sodium: 139 mmol/L (ref 135–145)
Total Bilirubin: 1.3 mg/dL — ABNORMAL HIGH (ref 0.3–1.2)
Total Protein: 4.4 g/dL — ABNORMAL LOW (ref 6.5–8.1)

## 2016-01-25 LAB — RENAL FUNCTION PANEL
ALBUMIN: 2.1 g/dL — AB (ref 3.5–5.0)
ANION GAP: 7 (ref 5–15)
BUN: 51 mg/dL — ABNORMAL HIGH (ref 6–20)
CO2: 18 mmol/L — ABNORMAL LOW (ref 22–32)
Calcium: 6.6 mg/dL — ABNORMAL LOW (ref 8.9–10.3)
Chloride: 112 mmol/L — ABNORMAL HIGH (ref 101–111)
Creatinine, Ser: 5.55 mg/dL — ABNORMAL HIGH (ref 0.44–1.00)
GFR calc Af Amer: 11 mL/min — ABNORMAL LOW (ref >60)
GFR calc non Af Amer: 9 mL/min — ABNORMAL LOW (ref >60)
GLUCOSE: 99 mg/dL (ref 65–99)
PHOSPHORUS: 4.7 mg/dL — AB (ref 2.5–4.6)
POTASSIUM: 4.4 mmol/L (ref 3.5–5.1)
Sodium: 137 mmol/L (ref 135–145)

## 2016-01-25 LAB — CK
CK TOTAL: 47830 U/L — AB (ref 38–234)
Total CK: >50000 U/L — ABNORMAL HIGH (ref 38–234)
Total CK: >50000 U/L — ABNORMAL HIGH (ref 38–234)
Total CK: >50000 U/L — ABNORMAL HIGH (ref 38–234)

## 2016-01-25 LAB — PHOSPHORUS: PHOSPHORUS: 4.6 mg/dL (ref 2.5–4.6)

## 2016-01-25 LAB — CBC
HEMATOCRIT: 29.8 % — AB (ref 36.0–46.0)
Hemoglobin: 10.1 g/dL — ABNORMAL LOW (ref 12.0–15.0)
MCH: 30.5 pg (ref 26.0–34.0)
MCHC: 33.9 g/dL (ref 30.0–36.0)
MCV: 90 fL (ref 78.0–100.0)
Platelets: 140 10*3/uL — ABNORMAL LOW (ref 150–400)
RBC: 3.31 MIL/uL — ABNORMAL LOW (ref 3.87–5.11)
RDW: 13.3 % (ref 11.5–15.5)
WBC: 9.7 10*3/uL (ref 4.0–10.5)

## 2016-01-25 MED ORDER — TRAMADOL HCL 50 MG PO TABS
50.0000 mg | ORAL_TABLET | Freq: Two times a day (BID) | ORAL | Status: DC | PRN
  Administered 2016-01-26: 50 mg via ORAL
  Filled 2016-01-25: qty 1

## 2016-01-25 MED ORDER — TRAMADOL HCL 50 MG PO TABS
ORAL_TABLET | ORAL | Status: AC
  Filled 2016-01-25: qty 2

## 2016-01-25 MED ORDER — LIDOCAINE HCL 1 % IJ SOLN
INTRAMUSCULAR | Status: AC
  Filled 2016-01-25: qty 20

## 2016-01-25 MED ORDER — OXYCODONE HCL 5 MG PO TABS
5.0000 mg | ORAL_TABLET | Freq: Once | ORAL | Status: AC
  Administered 2016-01-25: 5 mg via ORAL
  Filled 2016-01-25: qty 1

## 2016-01-25 MED ORDER — HEPARIN SODIUM (PORCINE) 1000 UNIT/ML IJ SOLN
INTRAMUSCULAR | Status: AC
  Filled 2016-01-25: qty 1

## 2016-01-25 MED ORDER — RAMELTEON 8 MG PO TABS
8.0000 mg | ORAL_TABLET | Freq: Every day | ORAL | Status: DC
  Administered 2016-01-25 – 2016-02-09 (×16): 8 mg via ORAL
  Filled 2016-01-25 (×19): qty 1

## 2016-01-25 MED ORDER — DICLOFENAC SODIUM 1 % TD GEL
2.0000 g | Freq: Four times a day (QID) | TRANSDERMAL | Status: DC
  Administered 2016-01-25 – 2016-02-01 (×8): 2 g via TOPICAL
  Filled 2016-01-25: qty 100

## 2016-01-25 MED ORDER — FENTANYL CITRATE (PF) 100 MCG/2ML IJ SOLN
INTRAMUSCULAR | Status: AC
  Filled 2016-01-25: qty 2

## 2016-01-25 MED ORDER — LIDOCAINE HCL 1 % IJ SOLN
INTRAMUSCULAR | Status: AC | PRN
  Administered 2016-01-25: 10 mL

## 2016-01-25 MED ORDER — FENTANYL CITRATE (PF) 100 MCG/2ML IJ SOLN
INTRAMUSCULAR | Status: AC | PRN
  Administered 2016-01-25: 50 ug via INTRAVENOUS

## 2016-01-25 NOTE — Consult Note (Signed)
Reason for Consult:  Rule out compartment syndrome right thigh Referring Physician:  Medicine Service  Alejandra Park is an 30 y.o. female.  HPI: This patient is a 30 year old female who was admitted just recently due to acute rhabdomyolysis. She apparently was found down according to the notes from heroin abuse. She is currently in the dialysis unit receiving hemodialysis due to her acute renal insult. Orthopedic surgery is consult at due to bilateral thigh swelling and pain with the right being worse than the left. She reports both eyes being tender but the right is much more so. She reports is some slight numbness in her feet. When I arrived to see her dialysis she appeared comfortable and in no acute distress.  Past Medical History:  Diagnosis Date  . Bulging disc   . Chronic back pain   . Fracture of sternum   . Umbilical hernia     Past Surgical History:  Procedure Laterality Date  . IR GENERIC HISTORICAL  01/25/2016   IR US GUIDE VASC ACCESS RIGHT 01/25/2016 Greggory Keen, MD [facility]  . IR GENERIC HISTORICAL  01/25/2016   IR FLUORO GUIDE CV LINE RIGHT 01/25/2016 Greggory Keen, MD [facility]  . KNEE SURGERY    . TUBAL LIGATION      No family history on file.  Social History:  reports that she has been smoking.  She does not have any smokeless tobacco history on file. She reports that she drinks alcohol. She reports that she uses drugs, including IV.  Allergies: No Known Allergies  Medications: I have reviewed the patient's current medications.  Results for orders placed or performed during the hospital encounter of 01/23/16 (from the past 48 hour(s))  Comprehensive metabolic panel     Status: Abnormal   Collection Time: 01/23/16  1:30 PM  Result Value Ref Range   Sodium 136 135 - 145 mmol/L   Potassium 5.7 (H) 3.5 - 5.1 mmol/L   Chloride 108 101 - 111 mmol/L   CO2 21 (L) 22 - 32 mmol/L   Glucose, Bld 96 65 - 99 mg/dL   BUN 32 (H) 6 - 20 mg/dL   Creatinine, Ser  2.30 (H) 0.44 - 1.00 mg/dL   Calcium 5.6 (LL) 8.9 - 10.3 mg/dL    Comment: CRITICAL RESULT CALLED TO, READ BACK BY AND VERIFIED WITH: GARNER,S. RN @ [phone] 01/23/16 BY EDENS,C.    Total Protein 4.7 (L) 6.5 - 8.1 g/dL   Albumin 2.2 (L) 3.5 - 5.0 g/dL   AST 1,706 (H) 15 - 41 U/L   ALT 625 (H) 14 - 54 U/L   Alkaline Phosphatase 55 38 - 126 U/L   Total Bilirubin 0.4 0.3 - 1.2 mg/dL   GFR calc non Af Amer 27 (L) >60 mL/min   GFR calc Af Amer 31 (L) >60 mL/min    Comment: (NOTE) The eGFR has been calculated using the CKD EPI equation. This calculation has not been validated in all clinical situations. eGFR's persistently <60 mL/min signify possible Chronic Kidney Disease.    Anion gap 7 5 - 15  HIV antibody     Status: None   Collection Time: 01/23/16  1:30 PM  Result Value Ref Range   HIV Screen 4th Generation wRfx Non Reactive Non Reactive    Comment: (NOTE) Performed At: [facility] [address] [place], [place] [id number] Lindon Romp MD PP:[phone]   Protime-INR     Status: None   Collection Time: 01/23/16  1:30 PM  Result Value Ref Range   Prothrombin Time 14.9 11.4 - 15.2 seconds   INR 1.16   I-Stat CG4 Lactic Acid, ED     Status: None   Collection Time: 01/23/16  1:54 PM  Result Value Ref Range   Lactic Acid, Venous 1.46 0.5 - 1.9 mmol/L  MRSA PCR Screening     Status: Abnormal   Collection Time: 01/23/16  6:23 PM  Result Value Ref Range   MRSA by PCR POSITIVE (A) NEGATIVE    Comment:        The GeneXpert MRSA Assay (FDA approved for NASAL specimens only), is one component of a comprehensive MRSA colonization surveillance program. It is not intended to diagnose MRSA infection nor to guide or monitor treatment for MRSA infections. CRITICAL RESULT CALLED TO, READ BACK BY AND VERIFIED WITH: C. WOODARD, RN AT 2237 ON 01/23/16 BY C. JESSUP, MLT.   Comprehensive metabolic panel     Status: Abnormal   Collection Time: 01/23/16  6:43 PM  Result Value  Ref Range   Sodium 137 135 - 145 mmol/L   Potassium 4.4 3.5 - 5.1 mmol/L    Comment: DELTA CHECK NOTED   Chloride 107 101 - 111 mmol/L   CO2 21 (L) 22 - 32 mmol/L   Glucose, Bld 120 (H) 65 - 99 mg/dL   BUN 36 (H) 6 - 20 mg/dL   Creatinine, Ser 2.85 (H) 0.44 - 1.00 mg/dL   Calcium 5.7 (LL) 8.9 - 10.3 mg/dL    Comment: CRITICAL RESULT CALLED TO, READ BACK BY AND VERIFIED WITH: [facility] RN 01/24/2016 0005 JORDANS    Total Protein 4.9 (L) 6.5 - 8.1 g/dL   Albumin 2.4 (L) 3.5 - 5.0 g/dL   AST 1,662 (H) 15 - 41 U/L   ALT 648 (H) 14 - 54 U/L   Alkaline Phosphatase 62 38 - 126 U/L   Total Bilirubin 0.5 0.3 - 1.2 mg/dL   GFR calc non Af Amer 21 (L) >60 mL/min   GFR calc Af Amer 24 (L) >60 mL/min    Comment: (NOTE) The eGFR has been calculated using the CKD EPI equation. This calculation has not been validated in all clinical situations. eGFR's persistently <60 mL/min signify possible Chronic Kidney Disease.    Anion gap 9 5 - 15  CK     Status: Abnormal   Collection Time: 01/23/16  6:43 PM  Result Value Ref Range   Total CK >50,000 (H) 38 - 234 U/L    Comment: RESULTS CONFIRMED BY MANUAL DILUTION  Comprehensive metabolic panel     Status: Abnormal   Collection Time: 01/24/16  2:43 AM  Result Value Ref Range   Sodium 138 135 - 145 mmol/L   Potassium 4.5 3.5 - 5.1 mmol/L   Chloride 108 101 - 111 mmol/L   CO2 20 (L) 22 - 32 mmol/L   Glucose, Bld 102 (H) 65 - 99 mg/dL   BUN 40 (H) 6 - 20 mg/dL   Creatinine, Ser 3.36 (H) 0.44 - 1.00 mg/dL   Calcium 5.6 (LL) 8.9 - 10.3 mg/dL    Comment: CRITICAL RESULT CALLED TO, READ BACK BY AND VERIFIED WITH: [facility] RN 01/24/2016 0522 JORDANS    Total Protein 4.4 (L) 6.5 - 8.1 g/dL   Albumin 2.0 (L) 3.5 - 5.0 g/dL   AST 1,500 (H) 15 - 41 U/L   ALT 583 (H) 14 - 54 U/L   Alkaline Phosphatase 54 38 - 126 U/L   Total Bilirubin 0.6  0.3 - 1.2 mg/dL   GFR calc non Af Amer 17 (L) >60 mL/min   GFR calc Af Amer 20 (L) >60 mL/min    Comment:  (NOTE) The eGFR has been calculated using the CKD EPI equation. This calculation has not been validated in all clinical situations. eGFR's persistently <60 mL/min signify possible Chronic Kidney Disease.    Anion gap 10 5 - 15  CBC     Status: Abnormal   Collection Time: 01/24/16  2:43 AM  Result Value Ref Range   WBC 12.2 (H) 4.0 - 10.5 K/uL   RBC 3.65 (L) 3.87 - 5.11 MIL/uL   Hemoglobin 11.2 (L) 12.0 - 15.0 g/dL    Comment: REPEATED TO VERIFY SPECIMEN CHECKED FOR CLOTS DELTA CHECK NOTED    HCT 33.0 (L) 36.0 - 46.0 %   MCV 90.4 78.0 - 100.0 fL   MCH 30.7 26.0 - 34.0 pg   MCHC 33.9 30.0 - 36.0 g/dL   RDW 13.4 11.5 - 15.5 %   Platelets 147 (L) 150 - 400 K/uL  CK     Status: Abnormal   Collection Time: 01/24/16  2:43 AM  Result Value Ref Range   Total CK >50,000 (H) 38 - 234 U/L    Comment: RESULTS CONFIRMED BY MANUAL DILUTION  Magnesium     Status: None   Collection Time: 01/24/16 12:03 PM  Result Value Ref Range   Magnesium 2.0 1.7 - 2.4 mg/dL  CK     Status: Abnormal   Collection Time: 01/24/16 12:36 PM  Result Value Ref Range   Total CK >50,000 (H) 38 - 234 U/L    Comment: RESULTS CONFIRMED BY MANUAL DILUTION  Basic metabolic panel     Status: Abnormal   Collection Time: 01/24/16 12:36 PM  Result Value Ref Range   Sodium 137 135 - 145 mmol/L   Potassium 4.5 3.5 - 5.1 mmol/L   Chloride 112 (H) 101 - 111 mmol/L   CO2 18 (L) 22 - 32 mmol/L   Glucose, Bld 99 65 - 99 mg/dL   BUN 43 (H) 6 - 20 mg/dL   Creatinine, Ser 3.91 (H) 0.44 - 1.00 mg/dL   Calcium 5.7 (LL) 8.9 - 10.3 mg/dL    Comment: CRITICAL RESULT CALLED TO, READ BACK BY AND VERIFIED WITH: M.RAND,RN 1328 01/24/16 CLARK,S    GFR calc non Af Amer 14 (L) >60 mL/min   GFR calc Af Amer 17 (L) >60 mL/min    Comment: (NOTE) The eGFR has been calculated using the CKD EPI equation. This calculation has not been validated in all clinical situations. eGFR's persistently <60 mL/min signify possible Chronic  Kidney Disease.    Anion gap 7 5 - 15  CK     Status: Abnormal   Collection Time: 01/24/16  7:16 PM  Result Value Ref Range   Total CK >50,000 (H) 38 - 234 U/L    Comment: RESULTS CONFIRMED BY MANUAL DILUTION  Phosphorus     Status: None   Collection Time: 01/24/16  7:16 PM  Result Value Ref Range   Phosphorus 4.2 2.5 - 4.6 mg/dL  Uric acid     Status: Abnormal   Collection Time: 01/24/16  7:16 PM  Result Value Ref Range   Uric Acid, Serum 8.6 (H) 2.3 - 6.6 mg/dL  CK     Status: Abnormal   Collection Time: 01/24/16 11:49 PM  Result Value Ref Range   Total CK >50,000 (H) 38 - 234 U/L  Comment: RESULTS CONFIRMED BY MANUAL DILUTION  CBC     Status: Abnormal   Collection Time: 01/25/16  6:34 AM  Result Value Ref Range   WBC 9.7 4.0 - 10.5 K/uL   RBC 3.31 (L) 3.87 - 5.11 MIL/uL   Hemoglobin 10.1 (L) 12.0 - 15.0 g/dL   HCT 29.8 (L) 36.0 - 46.0 %   MCV 90.0 78.0 - 100.0 fL   MCH 30.5 26.0 - 34.0 pg   MCHC 33.9 30.0 - 36.0 g/dL   RDW 13.3 11.5 - 15.5 %   Platelets 140 (L) 150 - 400 K/uL  Comprehensive metabolic panel     Status: Abnormal   Collection Time: 01/25/16  6:34 AM  Result Value Ref Range   Sodium 139 135 - 145 mmol/L   Potassium 4.4 3.5 - 5.1 mmol/L   Chloride 113 (H) 101 - 111 mmol/L   CO2 17 (L) 22 - 32 mmol/L   Glucose, Bld 87 65 - 99 mg/dL   BUN 51 (H) 6 - 20 mg/dL   Creatinine, Ser 5.21 (H) 0.44 - 1.00 mg/dL   Calcium 6.4 (LL) 8.9 - 10.3 mg/dL    Comment: CRITICAL RESULT CALLED TO, READ BACK BY AND VERIFIED WITH: Jackquline Berlin [phone] WILDERK    Total Protein 4.4 (L) 6.5 - 8.1 g/dL   Albumin 1.9 (L) 3.5 - 5.0 g/dL   AST 914 (H) 15 - 41 U/L   ALT 509 (H) 14 - 54 U/L   Alkaline Phosphatase 55 38 - 126 U/L   Total Bilirubin 1.3 (H) 0.3 - 1.2 mg/dL   GFR calc non Af Amer 10 (L) >60 mL/min   GFR calc Af Amer 12 (L) >60 mL/min    Comment: (NOTE) The eGFR has been calculated using the CKD EPI equation. This calculation has not been validated in all  clinical situations. eGFR's persistently <60 mL/min signify possible Chronic Kidney Disease.    Anion gap 9 5 - 15  Phosphorus     Status: None   Collection Time: 01/25/16  6:34 AM  Result Value Ref Range   Phosphorus 4.6 2.5 - 4.6 mg/dL  CK     Status: Abnormal   Collection Time: 01/25/16  6:34 AM  Result Value Ref Range   Total CK 47,830 (H) 38 - 234 U/L    Comment: RESULTS CONFIRMED BY MANUAL DILUTION  Renal function panel     Status: Abnormal   Collection Time: 01/25/16 11:52 AM  Result Value Ref Range   Sodium 137 135 - 145 mmol/L   Potassium 4.4 3.5 - 5.1 mmol/L   Chloride 112 (H) 101 - 111 mmol/L   CO2 18 (L) 22 - 32 mmol/L   Glucose, Bld 99 65 - 99 mg/dL   BUN 51 (H) 6 - 20 mg/dL   Creatinine, Ser 5.55 (H) 0.44 - 1.00 mg/dL   Calcium 6.6 (L) 8.9 - 10.3 mg/dL   Phosphorus 4.7 (H) 2.5 - 4.6 mg/dL   Albumin 2.1 (L) 3.5 - 5.0 g/dL   GFR calc non Af Amer 9 (L) >60 mL/min   GFR calc Af Amer 11 (L) >60 mL/min    Comment: (NOTE) The eGFR has been calculated using the CKD EPI equation. This calculation has not been validated in all clinical situations. eGFR's persistently <60 mL/min signify possible Chronic Kidney Disease.    Anion gap 7 5 - 15    [place] Extrem Low Right Ltd  Result Date: 01/24/2016 CLINICAL DATA:  30 year old female status  post fall 3 days ago with anterior thigh bruising. Query hematoma. Initial encounter. EXAM: ULTRASOUND RIGHT LOWER EXTREMITY LIMITED TECHNIQUE: Ultrasound examination of the lower extremity soft tissues was performed in the area of clinical concern. COMPARISON:  None. FINDINGS: Pearline Cables scale and Doppler ultrasound images in the area of clinical concern. Generalized subcutaneous edema. There is a trace amount of discrete fluid tracking between the fibromuscular bundles (image 13). There might also be areas of focal muscle bundle but edema (image 11 of the cine clip). No organized or drainable fluid collection or hematoma identified. IMPRESSION: Soft  tissue injury and edema with no focal hematoma or drainable fluid collection identified. Electronically Signed   By: Genevie Ann M.D.   On: 01/24/2016 13:27   Ir Fluoro Guide Cv Line Right  Result Date: 01/25/2016 INDICATION: POLYSUBSTANCE ABUSE, rhabdomyolysis, ACUTE RENAL FAILURE, ACCESS FOR DIALYSIS EXAM: IR RIGHT FLOURO GUIDE CV LINE; IR ULTRASOUND GUIDANCE VASC ACCESS RIGHT MEDICATIONS: 1% LIDOCAINE LOCALLY ANESTHESIA/SEDATION: Fentanyl 50 mcg was administered intravenously. Moderate Sedation Time: 0 minutes. The patient's level of consciousness and vital signs were monitored continuously by radiology nursing throughout the procedure under my direct supervision. FLUOROSCOPY TIME:  Fluoroscopy Time: 18 seconds (1 mGy). COMPLICATIONS: None immediate. PROCEDURE: Informed written consent was obtained from the patient after a thorough discussion of the procedural risks, benefits and alternatives. All questions were addressed. Maximal Sterile Barrier Technique was utilized including caps, mask, sterile gowns, sterile gloves, sterile drape, hand hygiene and skin antiseptic. A timeout was performed prior to the initiation of the procedure. Under sterile conditions and local anesthesia, ultrasound micropuncture access performed of the patent right internal jugular vein. Guidewire inserted followed by the 4 [place] dilator. Amplatz guidewire advanced. Tract dilatation performed to insert a 20 cm temporary dialysis catheter. Tip position at the SVC RA junction. Blood aspirated easily followed by saline and heparin flushes. External caps applied. Patient tolerated the procedure well. Images obtained for documentation. No immediate complication. Sterile dressing applied at site. IMPRESSION: Successful ultrasound and fluoroscopic right IJ temporary dialysis catheter. Ready for use. Electronically Signed   By: Jerilynn Mages.  Shick M.D.   On: 01/25/2016 12:43   Ir US Guide Vasc Access Right  Result Date: 01/25/2016 INDICATION:  POLYSUBSTANCE ABUSE, rhabdomyolysis, ACUTE RENAL FAILURE, ACCESS FOR DIALYSIS EXAM: IR RIGHT FLOURO GUIDE CV LINE; IR ULTRASOUND GUIDANCE VASC ACCESS RIGHT MEDICATIONS: 1% LIDOCAINE LOCALLY ANESTHESIA/SEDATION: Fentanyl 50 mcg was administered intravenously. Moderate Sedation Time: 0 minutes. The patient's level of consciousness and vital signs were monitored continuously by radiology nursing throughout the procedure under my direct supervision. FLUOROSCOPY TIME:  Fluoroscopy Time: 18 seconds (1 mGy). COMPLICATIONS: None immediate. PROCEDURE: Informed written consent was obtained from the patient after a thorough discussion of the procedural risks, benefits and alternatives. All questions were addressed. Maximal Sterile Barrier Technique was utilized including caps, mask, sterile gowns, sterile gloves, sterile drape, hand hygiene and skin antiseptic. A timeout was performed prior to the initiation of the procedure. Under sterile conditions and local anesthesia, ultrasound micropuncture access performed of the patent right internal jugular vein. Guidewire inserted followed by the 4 [place] dilator. Amplatz guidewire advanced. Tract dilatation performed to insert a 20 cm temporary dialysis catheter. Tip position at the SVC RA junction. Blood aspirated easily followed by saline and heparin flushes. External caps applied. Patient tolerated the procedure well. Images obtained for documentation. No immediate complication. Sterile dressing applied at site. IMPRESSION: Successful ultrasound and fluoroscopic right IJ temporary dialysis catheter. Ready for use. Electronically Signed   By:  M.  Shick M.D.   On: 01/25/2016 12:43    ROS Blood pressure 131/89, pulse 78, temperature 98.6 F (37 C), temperature source Oral, resp. rate (!) 22, height _0  (1.702 m), weight 188 lb 15 oz (85.7 kg), last menstrual period 01/13/2016, SpO2 96 %. Physical Exam She is easy to awake and follows commands appropriately. Her right thigh  is more swollen than her left thigh. It is not tight in terms of significant firmness. I can actually internally and and actually rotate her right hip and when I hold her right hip up she can flex and extend her knee. She also easily flexes and extends her foot ankle and toes bilaterally. Both sides have equal pulses. Both feet are well perfused. Both calves are soft. She has subjective decreased sensation over both feet. She does show significant pain to palpation of the anterior thigh. The iliotibial band is not tight.   Assessment/Plan: Rule out compartment syndrome right thigh  Based on my clinical exam and assessment she does not appear to have compartment syndrome currently of her thighs. Distally her motor function of the bilateral lower extremity is normal in both feet are well perfused. Both lower extremities are well perfused. Her subjective sensory deficit is minimal. Although her right thigh does seem more edematous than her left thigh, I do not see an emergent indication for compartment releases. I will continue to follow her clinical exam closely to see if this changes.  Mcarthur Rossetti 01/25/2016, 1:29 PM

## 2016-01-25 NOTE — Progress Notes (Signed)
Subjective:  Complains of pain all over. Feeling swollen and tight, worst in her bilateral thighs and buttocks. Having some numbness/tingling in her bilateral distal feet. Reports 'feeling miserable for 2-3 days and needs something done.' Does not feel any urge to void since foley placed. No abdominal pain. No dyspnea. Chills improving.   Objective:  Vital signs in last 24 hours: Vitals:   01/24/16 1225 01/24/16 1531 01/25/16 0033 01/25/16 0518  BP: (!) 136/96 (!) 131/91 (!) 143/99 (!) 136/96  Pulse: 94 76 88 83  Resp: (!) 23  16 20   Temp: 98.2 F (36.8 C) 98.7 F (37.1 C) 98.5 F (36.9 C) 98.9 F (37.2 C)  TempSrc: Oral Oral Oral Oral  SpO2: 97% 98% 96% 97%  Weight:    188 lb 15 oz (85.7 kg)  Height:       Physical Exam Constitutional: appears uncomfortable Cardiovascular: RRR, no murmurs, rubs, or gallops.  Pulmonary/Chest: CTAB, no wheezes, rales, or rhonchi.  Abdominal: Soft, non tender, non distended. +BS.  Extremities: Right thigh edematous and tender to palpation. Skin tight. Left thigh edematous but less tender and skin not tight across thigh. Right lower extremity with decreased sensation to light tough, motor function intact. Warm and well perfused, distal pulses 2+ bilaterally.  Neurological: A&Ox3, CN II - XII grossly intact.  Skin: No rashes or erythema. 3+ edema Psychiatric: Normal mood and affect  Assessment/Plan:  AKI secondary to Rhabdomyolysis: CK persistently elevated > 50,000 despite being +11 L since admission. Minimal UOP yesterday, 750 mL total yesterday. Cr continuing to rise 2.26 > 5.21 this morning. Baseline creatinine 0.7 in 2016. Most likely ATN from her Rhabdo. Evaluated by nephrology yesterday, appreciate recommendations. Electrolyte abnormalities improving. HyperK has resolved. Calcium improving, corrected 8.1 today. Phos normal. Uric acid elevated at 8.6. Became grossly volume overloaded after fluid resuscitation with little to no UOP. Fluids  stopped overnight. Concerning with her Cr continuing to rise and minimal UOP for 48 hours. CK remains >50000 and unable to push fluids currently given her volume status and concern for developing compartment syndrome. Will contact Renal this morning and assess for possible dialysis today.  -- Hold IVF for now -- Strict I/Os, daily weights -- Trending CK q6hrs  -- Repeat renal function panel this afternoon  -- Continue tramadol 100 mg Q12hrs for pain; pain control measures limited due to renal and hepatic dysfunction -- Appreciate Nephrology assistance  Acute Hepatitis: In the setting of IV drug use and rhabdomyolysis. AST and ALT now down trending 1900 ->  914 and 705 --> 509 respectively. Hep C ab positive, viral load pending. Likely multifactorial due to viral hepatitis and shock liver after possible transient drop in BP while using heroin. HIV negative.  -- f/u Hep C viral load  -- Avoid hepatotoxic meds  R Thigh Hematoma: No signs of compartment syndrome -- Continue to monitor, vascular checks  Hyperkalemia: Resolved (6.2 -> 5.7 -> 4.4 -> 4.5) s/p 2g calcium gluconate, 10u Novolog w/ D50 in the ED. EKG without peaked T waves, patient denies chest pain.  -- daily labs  Hypocalcemia: Slowly improving. Corrected Ca 8.1 this morning. No clinical signs or symptoms of hypocalcemia. Likely secondary to Rhabdomyolysis.  -monitor closely   Abnormal UA: UA with many bacteria, small leukocytes, positive ntirite, 6-30 squamous epithelial cells. Consistent with a non clean catch sample. She is asymptomatic. She was given vanc and cefepime in the ED.  -- Discontinued abx -- reevaluate if becomes symptomatic   Aspiration pneumonitis:  She denies fevers or chills but does have a productive cough. She is unable to describe her sputum quality. CXR with extensive infiltrate in left upper lobe and left lower lobe. Concern for aspiration given her loss of consciousness with her heroin use. WBC 14 > 9.7.  Remains afebrile.  -- Continue to observe for now.   Opioid use disorder:  -- HIV negative -- May consider Suboxone as outpatient  FEN: Monitoring electrolytes, regular diet VTE ppx: Heparin SQ Code Status: FULL   Dispo: Pending clinical improvement  Alejandra [name], MD 01/25/2016, 8:17 AM Pager: [phone]

## 2016-01-25 NOTE — Progress Notes (Signed)
Internal Medicine Night Float Interim Progress Note  S: Called by nursing at 2100 for bilateral leg pain and tingling. Dr. Vincente LibertyMolt and I went to examine the patient together. Patient states she continues to have severe pain in her bilateral legs but worst in her right thigh. She states the pain is similar to previous today. She wants pain medication.  O: Physical Exam Vitals:   01/25/16 1600 01/25/16 1630 01/25/16 1959 01/25/16 2304  BP: (!) 146/64 133/84 (!) 139/96 132/88  Pulse: 61 64 (!) 102 70  Resp: 20 20 (!) 24 20  Temp:  98.1 F (36.7 C) 98 F (36.7 C) 98.7 F (37.1 C)  TempSrc:  Oral Oral Oral  SpO2:  100% 98% 98%  Weight:      Height:       Extremities: Bilateral 1-2+ pitting edema in lower extremities. Right thigh is more edematous and more firm as compared to the left thigh. Her pedal pulses are 2+ bilaterally. Her extremities are warm and well perfused. Tender on palpation and movement.  Neurological: Strength is 5/5 in left lower extremity, 4/5 in right lower extremity, sensory intact to light touch bilaterally.   A/P:  Bilateral Lower Extremity Pain: 2/2 rhabdomyolysis, volume overload and likely neuropathy from downtime and nerve compression. No clinical signs of compartment syndrome at this time. We are limited with pain medication due to her acute hepatitis and renal failure. We will give a one time dose of oxycodone 5 mg IR to help alleviate pain. She is also on a reduced dose of tramadol 50 mg BID due to her liver function.   Karlene LinemanAlexa Burns, DO PGY-3 Internal Medicine Resident Pager # [phone] 01/25/2016 11:27 PM

## 2016-01-25 NOTE — Plan of Care (Signed)
Problem: Pain Managment: Goal: General experience of comfort will improve Outcome: Not Progressing Pt has had numerous complaints of pain throughout the night.  Given the circumstances of her hospitalization, she has been prescribed appropriate pain control, however she has not demonstrated an understanding of the risks associated with increasing or changing her pain control methods.  The IMTS team has been made aware of her concerns and has evaluated the patient in person, with the conclusion that no changes are appropriate at this time.

## 2016-01-25 NOTE — Procedures (Signed)
On dialysis, BP's stable.  Appreciate orthopedics eval.  Will need HD again Sunday.    I was present at this dialysis session, have reviewed the session itself and made  appropriate changes Vinson Moselleob Lanae Federer MD [facility] pager [phone]   01/25/2016, 3:13 PM

## 2016-01-25 NOTE — Progress Notes (Signed)
Subjective: Interval History: has complaints miserable with leg and hip pain.  Objective: Vital signs in last 24 hours: Temp:  [98.2 F (36.8 C)-98.9 F (37.2 C)] 98.6 F (37 C) (12/02 0822) Pulse Rate:  [76-94] 86 (12/02 0822) Resp:  [16-23] 18 (12/02 0822) BP: (131-143)/(91-99) 141/99 (12/02 0822) SpO2:  [96 %-98 %] 97 % (12/02 0822) Weight:  [85.7 kg (188 lb 15 oz)] 85.7 kg (188 lb 15 oz) (12/02 0518) Weight change: 7.6 kg (16 lb 12.1 oz)  Intake/Output from previous day: 12/01 0701 - 12/02 0700 In: 2291.3 [P.O.:480; I.V.:1811.3] Out: 750 [Urine:750] Intake/Output this shift: No intake/output data recorded.  General appearance: cooperative, moderate distress and toxic Resp: diminished breath sounds bilaterally and rales bibasilar Cardio: regular rate and rhythm, S1, S2 normal, no murmur, click, rub or gallop GI: pos bs, soft, Extremities: edema presacral and thigh areas tight,    Lab Results:  Recent Labs  01/24/16 0243 01/25/16 0634  WBC 12.2* 9.7  HGB 11.2* 10.1*  HCT 33.0* 29.8*  PLT 147* 140*   BMET:  Recent Labs  01/24/16 1236 01/25/16 0634  NA 137 139  K 4.5 4.4  CL 112* 113*  CO2 18* 17*  GLUCOSE 99 87  BUN 43* 51*  CREATININE 3.91* 5.21*  CALCIUM 5.7* 6.4*   No results for input(s): PTH in the last 72 hours. Iron Studies: No results for input(s): IRON, TIBC, TRANSFERRIN, FERRITIN in the last 72 hours.  Studies/Results: [place] Extrem Low Right Ltd  Result Date: 01/24/2016 CLINICAL DATA:  30 year old female status post fall 3 days ago with anterior thigh bruising. Query hematoma. Initial encounter. EXAM: ULTRASOUND RIGHT LOWER EXTREMITY LIMITED TECHNIQUE: Ultrasound examination of the lower extremity soft tissues was performed in the area of clinical concern. COMPARISON:  None. FINDINGS: Wallace CullensGray scale and Doppler ultrasound images in the area of clinical concern. Generalized subcutaneous edema. There is a trace amount of discrete fluid tracking between  the fibromuscular bundles (image 13). There might also be areas of focal muscle bundle but edema (image 11 of the cine clip). No organized or drainable fluid collection or hematoma identified. IMPRESSION: Soft tissue injury and edema with no focal hematoma or drainable fluid collection identified. Electronically Signed   By: Odessa FlemingH  Hall M.D.   On: 01/24/2016 13:27    I have reviewed the patient's current medications.  Assessment/Plan: 1 AKI vol xs, acidemic  . Acute uremic sx. Will do HD.   2 Rhabdo very concerned ms edema and pain, concern of compartment syndromes. No ^ K yet. 3 Anemia 4 Vol xs 5 Substance abuse P Temp cath , HD, Ortho eval rec,  Follow phos   LOS: 2 days   Kirubel Aja L 01/25/2016,9:56 AM

## 2016-01-25 NOTE — Progress Notes (Signed)
Physical Therapy Treatment Patient Details Name: Alejandra FerronRebecca D Kluever MRN: [id number] DOB: [birth year] Today's Date: 01/25/2016    History of Present Illness Pt adm with rhabdomyolosis after being on the floor at home for >6 hours after using heroin.  CK > 50,000. Pt also with AKI and acute hepatitis. PMH - substance abuse, chronic pain, depression    PT Comments    Pt presented supine in bed with HOB elevated, awake and willing to participate in therapy session. Pt very limited this session secondary to bilateral LE pain; however, was able to progress to standing from bed for ~10 seconds with mod A x2. Pt would continue to benefit from skilled physical therapy services at this time while admitted and after d/c to address her limitations in order to improve her overall safety and independence with functional mobility.   Follow Up Recommendations  CIR     Equipment Recommendations  None recommended by PT;Other (comment) (defered to next venue)    Recommendations for Other Services OT consult;Rehab consult     Precautions / Restrictions Precautions Precautions: Fall Restrictions Weight Bearing Restrictions: No    Mobility  Bed Mobility Overal bed mobility: Needs Assistance Bed Mobility: Supine to Sit;Sit to Supine     Supine to sit: Min assist Sit to supine: Min assist   General bed mobility comments: Assist to bring legs off EOB to come to sitting and to bring legs back up on bed when returning to supine  Transfers Overall transfer level: Needs assistance Equipment used: 2 person hand held assist Transfers: Sit to/from Stand Sit to Stand: Mod assist;+2 physical assistance         General transfer comment: pt able to stand with mod A x2 from bed briefly (approximately 10 seconds) and needed to sit back down secondary to pain  Ambulation/Gait             General Gait Details: NT   Stairs            Wheelchair Mobility    Modified Rankin (Stroke Patients  Only)       Balance Overall balance assessment: Needs assistance Sitting-balance support: Feet supported;No upper extremity supported Sitting balance-Leahy Scale: Good     Standing balance support: During functional activity;Bilateral upper extremity supported Standing balance-Leahy Scale: Poor                      Cognition Arousal/Alertness: Awake/alert Behavior During Therapy: WFL for tasks assessed/performed Overall Cognitive Status: Within Functional Limits for tasks assessed                      Exercises      General Comments        Pertinent Vitals/Pain Pain Assessment: Faces Faces Pain Scale: Hurts even more Pain Location: bilateral LE's (R>L) Pain Descriptors / Indicators: Grimacing;Guarding;Moaning Pain Intervention(s): Monitored during session;Repositioned    Home Living                      Prior Function            PT Goals (current goals can now be found in the care plan section) Acute Rehab PT Goals Patient Stated Goal: get better PT Goal Formulation: With patient Time For Goal Achievement: 02/07/16 Potential to Achieve Goals: Good Progress towards PT goals: Progressing toward goals    Frequency    Min 3X/week      PT Plan Current plan remains appropriate  Co-evaluation             End of Session Equipment Utilized During Treatment: Gait belt Activity Tolerance: Patient limited by pain Patient left: in bed;with call bell/phone within reach;with bed alarm set     Time: [id number] PT Time Calculation (min) (ACUTE ONLY): 15 min  Charges:  $Therapeutic Activity: 8-22 mins                    G CodesAlessandra Bevels:      Margo Lama M Bradlee Bridgers 01/25/2016, 10:48 AM Deborah ChalkJennifer Nayel Purdy, PT, DPT [phone]

## 2016-01-25 NOTE — Procedures (Signed)
ARF, ACCESS FOR HD  S/P RT IJ TEMP DIALYSIS CATH  TIP SVCRA NO COMP STABLE READY FOR USE FULL REPORT IN PACS

## 2016-01-26 LAB — COMPREHENSIVE METABOLIC PANEL
ALBUMIN: 2 g/dL — AB (ref 3.5–5.0)
ALT: 401 U/L — ABNORMAL HIGH (ref 14–54)
ANION GAP: 9 (ref 5–15)
AST: 633 U/L — ABNORMAL HIGH (ref 15–41)
Alkaline Phosphatase: 52 U/L (ref 38–126)
BILIRUBIN TOTAL: 1.3 mg/dL — AB (ref 0.3–1.2)
BUN: 43 mg/dL — ABNORMAL HIGH (ref 6–20)
CO2: 20 mmol/L — AB (ref 22–32)
Calcium: 7.3 mg/dL — ABNORMAL LOW (ref 8.9–10.3)
Chloride: 108 mmol/L (ref 101–111)
Creatinine, Ser: 5.66 mg/dL — ABNORMAL HIGH (ref 0.44–1.00)
GFR calc Af Amer: 11 mL/min — ABNORMAL LOW (ref >60)
GFR calc non Af Amer: 9 mL/min — ABNORMAL LOW (ref >60)
GLUCOSE: 83 mg/dL (ref 65–99)
POTASSIUM: 3.8 mmol/L (ref 3.5–5.1)
SODIUM: 137 mmol/L (ref 135–145)
TOTAL PROTEIN: 4.4 g/dL — AB (ref 6.5–8.1)

## 2016-01-26 LAB — CBC
HCT: 28.2 % — ABNORMAL LOW (ref 36.0–46.0)
Hemoglobin: 9.6 g/dL — ABNORMAL LOW (ref 12.0–15.0)
MCH: 30.3 pg (ref 26.0–34.0)
MCHC: 34 g/dL (ref 30.0–36.0)
MCV: 89 fL (ref 78.0–100.0)
PLATELETS: 136 10*3/uL — AB (ref 150–400)
RBC: 3.17 MIL/uL — AB (ref 3.87–5.11)
RDW: 13.2 % (ref 11.5–15.5)
WBC: 8.5 10*3/uL (ref 4.0–10.5)

## 2016-01-26 LAB — CK: Total CK: 35607 U/L — ABNORMAL HIGH (ref 38–234)

## 2016-01-26 LAB — PHOSPHORUS: Phosphorus: 4.4 mg/dL (ref 2.5–4.6)

## 2016-01-26 LAB — HCV RNA QUANT RFLX ULTRA OR GENOTYP
HCV RNA QNT(LOG COPY/ML): 3.856 {Log_IU}/mL
HEPATITIS C QUANTITATION: 7170 [IU]/mL

## 2016-01-26 LAB — MAGNESIUM: Magnesium: 1.9 mg/dL (ref 1.7–2.4)

## 2016-01-26 LAB — HEPATITIS C GENOTYPE

## 2016-01-26 MED ORDER — METHOCARBAMOL 500 MG PO TABS
1500.0000 mg | ORAL_TABLET | Freq: Four times a day (QID) | ORAL | Status: AC | PRN
  Administered 2016-01-26 – 2016-01-29 (×12): 1500 mg via ORAL
  Filled 2016-01-26 (×12): qty 3

## 2016-01-26 MED ORDER — ACETAMINOPHEN 325 MG PO TABS
ORAL_TABLET | ORAL | Status: AC
  Administered 2016-01-26: 325 mg
  Filled 2016-01-26: qty 2

## 2016-01-26 MED ORDER — LIDOCAINE-PRILOCAINE 2.5-2.5 % EX CREA: 1.0000 "application " | TOPICAL_CREAM | CUTANEOUS | Status: DC | PRN

## 2016-01-26 MED ORDER — LIDOCAINE HCL (PF) 1 % IJ SOLN: 5.0000 mL | INTRAMUSCULAR | Status: DC | PRN

## 2016-01-26 MED ORDER — HEPARIN SODIUM (PORCINE) 1000 UNIT/ML DIALYSIS
1000.0000 [IU] | INTRAMUSCULAR | Status: DC | PRN
Start: 2016-01-26 — End: 2016-01-26

## 2016-01-26 MED ORDER — TRAMADOL HCL 50 MG PO TABS
100.0000 mg | ORAL_TABLET | Freq: Two times a day (BID) | ORAL | Status: DC | PRN
  Administered 2016-01-26 – 2016-01-27 (×2): 100 mg via ORAL
  Filled 2016-01-26 (×2): qty 2

## 2016-01-26 MED ORDER — ALTEPLASE 2 MG IJ SOLR: 2.0000 mg | Freq: Once | INTRAMUSCULAR | Status: DC | PRN

## 2016-01-26 MED ORDER — HEPARIN SODIUM (PORCINE) 1000 UNIT/ML DIALYSIS: 40.0000 [IU]/kg | Freq: Once | INTRAMUSCULAR | Status: DC

## 2016-01-26 MED ORDER — SODIUM CHLORIDE 0.9 % IV SOLN: 100.0000 mL | INTRAVENOUS | Status: DC | PRN

## 2016-01-26 MED ORDER — PENTAFLUOROPROP-TETRAFLUOROETH EX AERO: 1.0000 "application " | INHALATION_SPRAY | CUTANEOUS | Status: DC | PRN

## 2016-01-26 NOTE — Progress Notes (Signed)
Patient arrived to unit by bed.  Reviewed treatment plan and this RN agrees with plan.  Report received from bedside RN, Joni ReiningNicole.  Consent verified.  Patient A & O X 4.   Lung sounds clear to ausculation in all fields. BLE 2+ pitting edema. Cardiac:  NSR.  Removed caps and cleansed RIJ catheter with chlorhedxidine.  Aspirated ports of heparin and flushed them with saline per protocol.  Connected and secured lines, initiated treatment at 1711.  UF Goal of 4000mL and net fluid removal 3.5L.  Will continue to monitor.

## 2016-01-26 NOTE — Progress Notes (Signed)
Patient transferring to 6E. Report called to receiving nurse. All questions answered.  

## 2016-01-26 NOTE — Progress Notes (Signed)
Subjective: Interval History: has complaints pain a little better.  Objective: Vital signs in last 24 hours: Temp:  [98 F (36.7 C)-98.7 F (37.1 C)] 98.3 F (36.8 C) (12/03 0400) Pulse Rate:  [57-102] 72 (12/03 0400) Resp:  [15-24] 15 (12/03 0400) BP: (109-157)/(64-96) 139/82 (12/03 0400) SpO2:  [96 %-100 %] 97 % (12/03 0400) Weight:  [84.2 kg (185 lb 10 oz)-84.7 kg (186 lb 11.7 oz)] 84.2 kg (185 lb 10 oz) (12/03 0441) Weight change: -1 kg (-2 lb 3.3 oz)  Intake/Output from previous day: 12/02 0701 - 12/03 0700 In: 483 [P.O.:480; I.V.:3] Out: 2600 [Urine:100] Intake/Output this shift: No intake/output data recorded.  General appearance: alert, cooperative, mild distress and pale Neck: see below Resp: RIJ cath Cardio: S1, S2 normal and systolic murmur: holosystolic 2/6, blowing at apex GI: pos bs, liver down 5 cm, soft Extremities: edema 3-4+ and thighs tight, bruises lat, less tight than yest  Lab Results:  Recent Labs  01/24/16 0243 01/25/16 0634  WBC 12.2* 9.7  HGB 11.2* 10.1*  HCT 33.0* 29.8*  PLT 147* 140*   BMET:  Recent Labs  01/25/16 0634 01/25/16 1152  NA 139 137  K 4.4 4.4  CL 113* 112*  CO2 17* 18*  GLUCOSE 87 99  BUN 51* 51*  CREATININE 5.21* 5.55*  CALCIUM 6.4* 6.6*   No results for input(s): PTH in the last 72 hours. Iron Studies: No results for input(s): IRON, TIBC, TRANSFERRIN, FERRITIN in the last 72 hours.  Studies/Results: [place] Extrem Low Right Ltd  Result Date: 01/24/2016 CLINICAL DATA:  30 year old female status post fall 3 days ago with anterior thigh bruising. Query hematoma. Initial encounter. EXAM: ULTRASOUND RIGHT LOWER EXTREMITY LIMITED TECHNIQUE: Ultrasound examination of the lower extremity soft tissues was performed in the area of clinical concern. COMPARISON:  None. FINDINGS: Wallace CullensGray scale and Doppler ultrasound images in the area of clinical concern. Generalized subcutaneous edema. There is a trace amount of discrete fluid  tracking between the fibromuscular bundles (image 13). There might also be areas of focal muscle bundle but edema (image 11 of the cine clip). No organized or drainable fluid collection or hematoma identified. IMPRESSION: Soft tissue injury and edema with no focal hematoma or drainable fluid collection identified. Electronically Signed   By: Odessa FlemingH  Hall M.D.   On: 01/24/2016 13:27   Ir Fluoro Guide Cv Line Right  Result Date: 01/25/2016 INDICATION: POLYSUBSTANCE ABUSE, rhabdomyolysis, ACUTE RENAL FAILURE, ACCESS FOR DIALYSIS EXAM: IR RIGHT FLOURO GUIDE CV LINE; IR ULTRASOUND GUIDANCE VASC ACCESS RIGHT MEDICATIONS: 1% LIDOCAINE LOCALLY ANESTHESIA/SEDATION: Fentanyl 50 mcg was administered intravenously. Moderate Sedation Time: 0 minutes. The patient's level of consciousness and vital signs were monitored continuously by radiology nursing throughout the procedure under my direct supervision. FLUOROSCOPY TIME:  Fluoroscopy Time: 18 seconds (1 mGy). COMPLICATIONS: None immediate. PROCEDURE: Informed written consent was obtained from the patient after a thorough discussion of the procedural risks, benefits and alternatives. All questions were addressed. Maximal Sterile Barrier Technique was utilized including caps, mask, sterile gowns, sterile gloves, sterile drape, hand hygiene and skin antiseptic. A timeout was performed prior to the initiation of the procedure. Under sterile conditions and local anesthesia, ultrasound micropuncture access performed of the patent right internal jugular vein. Guidewire inserted followed by the 4 JamaicaFrench dilator. Amplatz guidewire advanced. Tract dilatation performed to insert a 20 cm temporary dialysis catheter. Tip position at the SVC RA junction. Blood aspirated easily followed by saline and heparin flushes. External caps applied. Patient tolerated the procedure  well. Images obtained for documentation. No immediate complication. Sterile dressing applied at site. IMPRESSION: Successful  ultrasound and fluoroscopic right IJ temporary dialysis catheter. Ready for use. Electronically Signed   By: Judie PetitM.  Shick M.D.   On: 01/25/2016 12:43   Ir Koreas Guide Vasc Access Right  Result Date: 01/25/2016 INDICATION: POLYSUBSTANCE ABUSE, rhabdomyolysis, ACUTE RENAL FAILURE, ACCESS FOR DIALYSIS EXAM: IR RIGHT FLOURO GUIDE CV LINE; IR ULTRASOUND GUIDANCE VASC ACCESS RIGHT MEDICATIONS: 1% LIDOCAINE LOCALLY ANESTHESIA/SEDATION: Fentanyl 50 mcg was administered intravenously. Moderate Sedation Time: 0 minutes. The patient's level of consciousness and vital signs were monitored continuously by radiology nursing throughout the procedure under my direct supervision. FLUOROSCOPY TIME:  Fluoroscopy Time: 18 seconds (1 mGy). COMPLICATIONS: None immediate. PROCEDURE: Informed written consent was obtained from the patient after a thorough discussion of the procedural risks, benefits and alternatives. All questions were addressed. Maximal Sterile Barrier Technique was utilized including caps, mask, sterile gowns, sterile gloves, sterile drape, hand hygiene and skin antiseptic. A timeout was performed prior to the initiation of the procedure. Under sterile conditions and local anesthesia, ultrasound micropuncture access performed of the patent right internal jugular vein. Guidewire inserted followed by the 4 [place] dilator. Amplatz guidewire advanced. Tract dilatation performed to insert a 20 cm temporary dialysis catheter. Tip position at the SVC RA junction. Blood aspirated easily followed by saline and heparin flushes. External caps applied. Patient tolerated the procedure well. Images obtained for documentation. No immediate complication. Sterile dressing applied at site. IMPRESSION: Successful ultrasound and fluoroscopic right IJ temporary dialysis catheter. Ready for use. Electronically Signed   By: Judie PetitM.  Shick M.D.   On: 01/25/2016 12:43    I have reviewed the patient's current medications.  Assessment/Plan: 1 AKI  rhabdo.  HD yest with slow vol and solute off, repeat q d x 2 2 anemia 3 Rhabdo concern of compartment syndome follow 4 Xs vol admin stopped 5 SUBSTANCE abuse 6 ^ LFTs, rhabdo P Hd, lower vol, lower solute     LOS: 3 days   Ellieanna Funderburg L 01/26/2016,8:40 AM

## 2016-01-26 NOTE — Progress Notes (Signed)
Patient ID: Alejandra FerronRebecca D Boxell, female   DOB: [birth year], 30 y.o.   MRN: [id number] Examined the patient's right thigh again this am.  Still swollen and slightly tight, but unchanged from my exam yesterday.  Certainly significant edema from her rhabdo.  Distally, her motor function is normal and she still has some numbness.  Her right foot is well perfused.  Will continue to follow closely.  Right now, no emergent compartment releases needed.

## 2016-01-26 NOTE — Progress Notes (Signed)
   Subjective:  Still has complaints of pain. Worst in her right thigh and left buttocks. Unchanged from prior. No other complaints.   Objective:  Vital signs in last 24 hours: Vitals:   01/25/16 2304 01/26/16 0400 01/26/16 0441 01/26/16 0700  BP: 132/88 139/82  118/83  Pulse: 70 72  69  Resp: 20 15  15   Temp: 98.7 F (37.1 C) 98.3 F (36.8 C)  98.4 F (36.9 C)  TempSrc: Oral Oral  Oral  SpO2: 98% 97%  99%  Weight:   185 lb 10 oz (84.2 kg)   Height:       Physical Exam Constitutional: appears uncomfortable Cardiovascular: RRR, no murmurs, rubs, or gallops.  Pulmonary/Chest: CTAB, no wheezes, rales, or rhonchi.  Abdominal: Soft, non tender, non distended. +BS.  Extremities: Right thigh edematous and tender to palpation. Skin tight. Left thigh edematous but less tender and skin not tight across thigh. Right lower extremity with decreased sensation to light tough, motor function intact. Warm and well perfused, distal pulses 2+ bilaterally.  Neurological: A&Ox3, CN II - XII grossly intact.  Skin: No rashes or erythema. 3+ edema Psychiatric: Normal mood and affect  Assessment/Plan:  AKI secondary to Rhabdomyolysis:  CK peaked at 35,000 this morning. Underwent dialysis yesterday with 2.5 L taken off. Remains oliguric with 100 mL UOP yesterday. [facility]. Hopefully kidney function will recover over the next days to weeks and her UOP will improve. Electrolytes stable. Will go for Dialysis again today per nephrology.  -- Hold IVF for now -- Strict I/Os, daily weights (180 > 188 > 185 lbs) -- Trending CK daily now that it has peaked -- Repeat renal function panel this afternoon  -- Continue tramadol 100 mg Q12hrs for pain; pain control measures limited due to renal and hepatic dysfunction -- Stable to transfer to Med Surg bed today -- Appreciate Nephrology assistance  Acute Hepatitis: In the setting of IV drug use and rhabdomyolysis. AST and ALT now down trending  1900 ->  633 and 705 --> 401 respectively. Hep C ab positive, viral load pending. Likely multifactorial due to viral hepatitis and shock liver after possible transient drop in BP while using heroin. HIV negative.  -- f/u Hep C viral load  -- Avoid hepatotoxic meds  R Thigh Hematoma: No signs of compartment syndrome. Appreciate Ortho consult.  -- Continue to monitor, vascular checks  Hyperkalemia: Resolved  Hypocalcemia: Resolved. Corrected Ca 8.9 this morning. No clinical signs or symptoms of hypocalcemia. Likely secondary to Rhabdomyolysis.  -monitor closely    Abnormal UA: UA with many bacteria, small leukocytes, positive ntirite, 6-30 squamous epithelial cells. Consistent with a non clean catch sample. She is asymptomatic. She was given vanc and cefepime in the ED.  -- Discontinued abx -- reevaluate if becomes symptomatic   Aspiration pneumonitis: She denies fevers or chills but does have a productive cough. She is unable to describe her sputum quality. CXR with extensive infiltrate in left upper lobe and left lower lobe. Concern for aspiration given her loss of consciousness with her heroin use. WBC 14 > 8.5. Remains afebrile.  -- Continue to observe for now.   Opioid use disorder:  -- HIV negative -- May consider Suboxone as outpatient  FEN: Monitoring electrolytes, regular diet VTE ppx: Heparin SQ Code Status: FULL   Dispo: Pending clinical improvement  Alejandra NoseNathan [name] Docter, MD 01/26/2016, 11:36 AM Pager: [phone]

## 2016-01-26 NOTE — Progress Notes (Signed)
Patient transferred to 6e29. Contact precautions in place. Non-telemetry. Family at bedside. Alert and oriented. VSS. Pain 10/10. Bess KindsGWALTNEY, Raevin Wierenga B, RN

## 2016-01-27 ENCOUNTER — Inpatient Hospital Stay (HOSPITAL_COMMUNITY): Payer: Self-pay

## 2016-01-27 DIAGNOSIS — Z960 Presence of urogenital implants: Secondary | ICD-10-CM

## 2016-01-27 LAB — COMPREHENSIVE METABOLIC PANEL
ALT: 391 U/L — AB (ref 14–54)
ANION GAP: 10 (ref 5–15)
AST: 523 U/L — ABNORMAL HIGH (ref 15–41)
Albumin: 2.1 g/dL — ABNORMAL LOW (ref 3.5–5.0)
Alkaline Phosphatase: 53 U/L (ref 38–126)
BUN: 37 mg/dL — ABNORMAL HIGH (ref 6–20)
CHLORIDE: 102 mmol/L (ref 101–111)
CO2: 24 mmol/L (ref 22–32)
Calcium: 7.8 mg/dL — ABNORMAL LOW (ref 8.9–10.3)
Creatinine, Ser: 5.77 mg/dL — ABNORMAL HIGH (ref 0.44–1.00)
GFR calc non Af Amer: 9 mL/min — ABNORMAL LOW (ref >60)
GFR, EST AFRICAN AMERICAN: 10 mL/min — AB (ref >60)
Glucose, Bld: 115 mg/dL — ABNORMAL HIGH (ref 65–99)
Potassium: 3.6 mmol/L (ref 3.5–5.1)
SODIUM: 136 mmol/L (ref 135–145)
Total Bilirubin: 1.8 mg/dL — ABNORMAL HIGH (ref 0.3–1.2)
Total Protein: 4.8 g/dL — ABNORMAL LOW (ref 6.5–8.1)

## 2016-01-27 LAB — CBC
HEMATOCRIT: 30.8 % — AB (ref 36.0–46.0)
HEMOGLOBIN: 10.6 g/dL — AB (ref 12.0–15.0)
MCH: 30.4 pg (ref 26.0–34.0)
MCHC: 34.4 g/dL (ref 30.0–36.0)
MCV: 88.3 fL (ref 78.0–100.0)
Platelets: 123 10*3/uL — ABNORMAL LOW (ref 150–400)
RBC: 3.49 MIL/uL — AB (ref 3.87–5.11)
RDW: 13.2 % (ref 11.5–15.5)
WBC: 10 10*3/uL (ref 4.0–10.5)

## 2016-01-27 LAB — CK: CK TOTAL: 18807 U/L — AB (ref 38–234)

## 2016-01-27 MED ORDER — MUPIROCIN 2 % EX OINT
1.0000 "application " | TOPICAL_OINTMENT | Freq: Two times a day (BID) | CUTANEOUS | Status: AC
  Administered 2016-01-28 – 2016-02-01 (×5): 1 via NASAL

## 2016-01-27 MED ORDER — OXYCODONE HCL 5 MG PO TABS
5.0000 mg | ORAL_TABLET | Freq: Four times a day (QID) | ORAL | Status: DC | PRN
  Administered 2016-01-27 – 2016-01-31 (×15): 5 mg via ORAL
  Filled 2016-01-27 (×15): qty 1

## 2016-01-27 MED ORDER — CHLORHEXIDINE GLUCONATE CLOTH 2 % EX PADS
6.0000 | MEDICATED_PAD | Freq: Every day | CUTANEOUS | Status: AC
  Administered 2016-01-28 – 2016-01-29 (×3): 6 via TOPICAL

## 2016-01-27 MED ORDER — MUPIROCIN 2 % EX OINT
TOPICAL_OINTMENT | CUTANEOUS | Status: AC
  Filled 2016-01-27: qty 22

## 2016-01-27 NOTE — Progress Notes (Signed)
Subjective: Patient is feeling better today. Pain and swelling in her left thigh have improved over the weekend with dialysis. She continues to complain of swelling in her buttocks and abdomen. She reports very minimal urine output, but per chart review is still oliguric with 0 cc UOP. Foley is in place. She is complaining of vaginal discharge that is brown in color. She denies vaginal pain, itching, and irritation. She is not currently sexually active, last menstrual period one week ago. I-stat beta hCG was negative in the ED.  Objective:  Vital signs in last 24 hours: Vitals:   01/26/16 1930 01/26/16 2011 01/26/16 2227 01/27/16 0444  BP: 102/70 113/67 115/62 119/70  Pulse: 70 73 87 76  Resp:  18 20 18   Temp:  98 F (36.7 C) 98.9 F (37.2 C) 98.4 F (36.9 C)  TempSrc:  Oral Oral Oral  SpO2:  98% 97% 97%  Weight:  180 lb 5.4 oz (81.8 kg) 177 lb 7.5 oz (80.5 kg)   Height:       Physical Exam Constitutional: appears uncomfortable  Cardiovascular: RRR, no murmurs, rubs, or gallops.  Pulmonary/Chest: CTAB, no wheezes, rales, or rhonchi.  Abdominal: Soft, non tender, non distended. +BS.  Extremities: Right thigh hematoma with swelling and erythema improved today from admission, right lower extremity with decreased sensation to light tough, motor function intact. Warm and well perfused, distal pulses 2+ bilaterally.  GU: Foley catheter in place, minimal amount of dark color urine in foley bag Neurological: A&Ox3, CN II - XII grossly intact.  Skin: No rashes or erythema  Psychiatric: Normal mood and affect  Assessment/Plan:  AKI Secondary to Rhabdomyolysis: CK on admission was >50,000 and came down yesterday to 35,000. CK today continues to downtrend at 18,807. She received aggressive IVFs on admission (over 10L) and became oliguric the following day. Creatinine continues to rise, 2.26 on admission -> 5.77 today. Nephrology was consulted on Friday and patient underwent dialysis Saturday  (net -2.5L) and Sunday (net -3.5L). She remains oliguric with 0 cc UOP since yesterday. Foley catheter in place.  -- Hold IVF for now -- Strict I/Os, daily weights (180 > 188 > 185 lbs > not checked today) -- Trending CK daily  -- Daily labs   -- Continue tramadol 100 mg Q12hrs for pain; pain control measures limited due to renal and hepatic dysfunction. Will contact pharmacy for recommendations.  -- Nephrology following, appreciate recommendations  -- Plan for HD again today   Acute Hepatitis: In the setting of IV drug use and rhabdomyolysis. AST and ALT now down trending 1900 -> 523 and 705 -->391 respectively. Hep C ab positive, viral load elevated at 7,000. Likely multifactorial due to viral hepatitis and shock liver after possible transient drop in BP while using heroin. HIV negative.  -- Avoid hepatotoxic meds -- Hep C genotype pending  -- Outpatient ID referral   R Thigh Hematoma:Improving, no signs of compartment syndrome. Patient continues to be non weightbearing due to pain.  -- Continue to monitor, vascular checks -- Ortho following; appreciate recommendations  -- xray R femur -- PT/OT pending plain film results   Hyperkalemia: Resolved  Hypocalcemia: Resolved; corrected calcium of 10.8. No clinical signs or symptoms of hypocalcemia. Likely secondary to Rhabdomyolysis.  -- Continue to monitor    Opioid use disorder:  -- HIV negative -- May consider Suboxone as outpatient  FEN: No fluids, dialysis today, monitoring electrolytes, regular diet VTE ppx: Heparin SQ Code Status: FULL   Dispo: Anticipated discharge  in approximately 3-4 days pending further HD and improvement in renal function.  Alejandra Pollarolyn Mayia Megill, MD 01/27/2016, 7:09 AM Pager: [phone]

## 2016-01-27 NOTE — Progress Notes (Signed)
Patient ID: Alejandra FerronRebecca D Park, female   DOB: 09/16/1985, 30 y.o.   MRN: [id number] Right thigh exam improved significantly this am.  Very soft and the patient reports less pain.

## 2016-01-27 NOTE — Progress Notes (Signed)
Physical Therapy Treatment Patient Details Name: Alejandra Park MRN: [id number] DOB: 08-05-1985 Today's Date: 01/27/2016    History of Present Illness Pt adm with rhabdomyolosis after being on the floor at home for >6 hours after using heroin.  CK > 50,000. Pt also with AKI and acute hepatitis. PMH - substance abuse, chronic pain, depression    PT Comments    Pt pivoted to chair with +2 min A and RW, still unable to ambulate due to pain in BLE's. Pt restless and jittery and shaking throughout session. Will continue to encourage her to mobilize. PT will continue to follow.   Follow Up Recommendations  CIR     Equipment Recommendations  Rolling walker with 5" wheels    Recommendations for Other Services OT consult;Rehab consult     Precautions / Restrictions Precautions Precautions: Fall Restrictions Weight Bearing Restrictions: No    Mobility  Bed Mobility Overal bed mobility: Needs Assistance Bed Mobility: Supine to Sit     Supine to sit: Min assist;Supervision     General bed mobility comments: pt able to sit straight up in bed but has great difficulty moving RLE and allowing it to be moved because of pain. Min A given but pt with increased pain. So sheet placed around pt's R foot and she used it as a strap to assist off bed. She seemed to tolerate this better.   Transfers Overall transfer level: Needs assistance Equipment used: Rolling walker (2 wheeled) Transfers: Sit to/from [employer] CorporationStand;Stand Pivot Transfers Sit to Stand: +2 physical assistance;Min assist Stand pivot transfers: Min assist;+2 safety/equipment       General transfer comment: pt able to stand to RW with min A +2 and take very small pivot steps to recliner. Could not tolerate standing > 1 min due to pain  Ambulation/Gait             General Gait Details: unable due to pain   Stairs            Wheelchair Mobility    Modified Rankin (Stroke Patients Only)       Balance Overall  balance assessment: Needs assistance Sitting-balance support: Feet supported Sitting balance-Leahy Scale: Good     Standing balance support: Bilateral upper extremity supported Standing balance-Leahy Scale: Poor Standing balance comment: needs UE support to maintain standing because of pain                    Cognition Arousal/Alertness: Awake/alert Behavior During Therapy: Agitated (very jittery, WD?) Overall Cognitive Status: Within Functional Limits for tasks assessed                      Exercises      General Comments General comments (skin integrity, edema, etc.): Pt agreeable to work with PT but lacks self motivation to progress without much encouragement. Appears also to be having WD symptoms.       Pertinent Vitals/Pain Pain Assessment: Faces Faces Pain Scale: Hurts whole lot Pain Location: LE's and buttocks Pain Descriptors / Indicators: Constant;Grimacing Pain Intervention(s): Limited activity within patient's tolerance;Monitored during session    Home Living                      Prior Function            PT Goals (current goals can now be found in the care plan section) Acute Rehab PT Goals Patient Stated Goal: get better PT Goal Formulation: With patient Time For  Goal Achievement: 02/07/16 Potential to Achieve Goals: Good Progress towards PT goals: Progressing toward goals    Frequency    Min 3X/week      PT Plan Current plan remains appropriate    Co-evaluation             End of Session Equipment Utilized During Treatment: Gait belt Activity Tolerance: Patient limited by pain Patient left: in chair;with call bell/phone within reach     Time: 1117-1132 PT Time Calculation (min) (ACUTE ONLY): 15 min  Charges:  $Therapeutic Activity: 8-22 mins                    G Codes:     Alejandra Park, PT  Acute Rehab Services  [phone]  [name] 01/27/2016, 1:14 PM

## 2016-01-27 NOTE — Progress Notes (Addendum)
Patient ID: Alejandra Park, female   DOB: 03/23/1985, 30 y.o.   MRN: [id number] S:c/o "pins and needles" of her feet. O:BP 122/70 (BP Location: Left Arm)   Pulse 84   Temp 98.5 F (36.9 C) (Oral)   Resp 18   Ht 5\' 7"  (1.702 m)   Wt 80.5 kg (177 lb 7.5 oz)   LMP 01/13/2016   SpO2 98%   BMI 27.80 kg/m   Intake/Output Summary (Last 24 hours) at 01/27/16 1113 Last data filed at 01/27/16 0930  Gross per 24 hour  Intake              240 ml  Output             3075 ml  Net            -2835 ml   Intake/Output: I/O last 3 completed shifts: In: 123 [P.O.:120; I.V.:3] Out: 3000 [Other:3000]  Intake/Output this shift:  Total I/O In: 240 [P.O.:240] Out: 75 [Urine:75] Weight change: -0.2 kg (-7.1 oz) Gen:WD WN WF in NAD CVS:no rub Resp:cta FAO:ZHYQMV Ext: + nonpitting edema of lower ext R >L   Recent Labs Lab 01/23/16 0451 01/23/16 1330 01/23/16 1843 01/24/16 0243 01/24/16 1236 01/24/16 1916 01/25/16 0634 01/25/16 1152 01/26/16 0816 01/27/16 0811  NA 135 136 137 138 137  --  139 137 137 136  K 6.2* 5.7* 4.4 4.5 4.5  --  4.4 4.4 3.8 3.6  CL 101 108 107 108 112*  --  113* 112* 108 102  CO2 22 21* 21* 20* 18*  --  17* 18* 20* 24  GLUCOSE 216* 96 120* 102* 99  --  87 99 83 115*  BUN 29* 32* 36* 40* 43*  --  51* 51* 43* 37*  CREATININE 2.26* 2.30* 2.85* 3.36* 3.91*  --  5.21* 5.55* 5.66* 5.77*  ALBUMIN 3.2* 2.2* 2.4* 2.0*  --   --  1.9* 2.1* 2.0* 2.1*  CALCIUM 6.8* 5.6* 5.7* 5.6* 5.7*  --  6.4* 6.6* 7.3* 7.8*  PHOS  --   --   --   --   --  4.2 4.6 4.7* 4.4  --   AST 1,938* 1,706* 1,662* 1,500*  --   --  914*  --  633* 523*  ALT 705* 625* 648* 583*  --   --  509*  --  401* 391*   Liver Function Tests:  Recent Labs Lab 01/25/16 0634 01/25/16 1152 01/26/16 0816 01/27/16 0811  AST 914*  --  633* 523*  ALT 509*  --  401* 391*  ALKPHOS 55  --  52 53  BILITOT 1.3*  --  1.3* 1.8*  PROT 4.4*  --  4.4* 4.8*  ALBUMIN 1.9* 2.1* 2.0* 2.1*   No results for input(s):  LIPASE, AMYLASE in the last 168 hours. No results for input(s): AMMONIA in the last 168 hours. CBC:  Recent Labs Lab 01/23/16 0451 01/24/16 0243 01/25/16 0634 01/26/16 0816 01/27/16 0811  WBC 14.1* 12.2* 9.7 8.5 10.0  NEUTROABS 12.3*  --   --   --   --   HGB 15.8* 11.2* 10.1* 9.6* 10.6*  HCT 47.1* 33.0* 29.8* 28.2* 30.8*  MCV 92.0 90.4 90.0 89.0 88.3  PLT 232 147* 140* 136* 123*   Cardiac Enzymes:  Recent Labs Lab 01/25/16 0634 01/25/16 1152 01/25/16 1753 01/26/16 0023 01/27/16 0551  CKTOTAL 47,830* >50,000* >50,000* 35,607* 18,807*   CBG:  Recent Labs Lab 01/23/16 0807  GLUCAP 276*  Iron Studies: No results for input(s): IRON, TIBC, TRANSFERRIN, FERRITIN in the last 72 hours. Studies/Results: [place]as Renal  Result Date: 01/27/2016 CLINICAL DATA:  Acute renal failure EXAM: RENAL / URINARY TRACT ULTRASOUND COMPLETE COMPARISON:  CT abdomen 02/13/2015 FINDINGS: Right Kidney: Length: 12 cm. Echogenicity within normal limits. 12 x 11 x 15 mm anechoic right upper pole renal mass most consistent with a small cyst. No solid mass or hydronephrosis visualized. Left Kidney: Length: 12.7 cm. Echogenicity within normal limits. No mass or hydronephrosis visualized. Bladder: Appears normal for degree of bladder distention. IMPRESSION: No obstructive uropathy. Electronically Signed   By: Elige KoHetal  Patel   On: 01/27/2016 11:09   Ir Fluoro Guide Cv Line Right  Result Date: 01/25/2016 INDICATION: POLYSUBSTANCE ABUSE, rhabdomyolysis, ACUTE RENAL FAILURE, ACCESS FOR DIALYSIS EXAM: IR RIGHT FLOURO GUIDE CV LINE; IR ULTRASOUND GUIDANCE VASC ACCESS RIGHT MEDICATIONS: 1% LIDOCAINE LOCALLY ANESTHESIA/SEDATION: Fentanyl 50 mcg was administered intravenously. Moderate Sedation Time: 0 minutes. The patient's level of consciousness and vital signs were monitored continuously by radiology nursing throughout the procedure under my direct supervision. FLUOROSCOPY TIME:  Fluoroscopy Time: 18 seconds (1 mGy).  COMPLICATIONS: None immediate. PROCEDURE: Informed written consent was obtained from the patient after a thorough discussion of the procedural risks, benefits and alternatives. All questions were addressed. Maximal Sterile Barrier Technique was utilized including caps, mask, sterile gowns, sterile gloves, sterile drape, hand hygiene and skin antiseptic. A timeout was performed prior to the initiation of the procedure. Under sterile conditions and local anesthesia, ultrasound micropuncture access performed of the patent right internal jugular vein. Guidewire inserted followed by the 4 [place] dilator. Amplatz guidewire advanced. Tract dilatation performed to insert a 20 cm temporary dialysis catheter. Tip position at the SVC RA junction. Blood aspirated easily followed by saline and heparin flushes. External caps applied. Patient tolerated the procedure well. Images obtained for documentation. No immediate complication. Sterile dressing applied at site. IMPRESSION: Successful ultrasound and fluoroscopic right IJ temporary dialysis catheter. Ready for use. Electronically Signed   By: Judie PetitM.  Shick M.D.   On: 01/25/2016 12:43   Ir [place] Guide Vasc Access Right  Result Date: 01/25/2016 INDICATION: POLYSUBSTANCE ABUSE, rhabdomyolysis, ACUTE RENAL FAILURE, ACCESS FOR DIALYSIS EXAM: IR RIGHT FLOURO GUIDE CV LINE; IR ULTRASOUND GUIDANCE VASC ACCESS RIGHT MEDICATIONS: 1% LIDOCAINE LOCALLY ANESTHESIA/SEDATION: Fentanyl 50 mcg was administered intravenously. Moderate Sedation Time: 0 minutes. The patient's level of consciousness and vital signs were monitored continuously by radiology nursing throughout the procedure under my direct supervision. FLUOROSCOPY TIME:  Fluoroscopy Time: 18 seconds (1 mGy). COMPLICATIONS: None immediate. PROCEDURE: Informed written consent was obtained from the patient after a thorough discussion of the procedural risks, benefits and alternatives. All questions were addressed. Maximal Sterile Barrier  Technique was utilized including caps, mask, sterile gowns, sterile gloves, sterile drape, hand hygiene and skin antiseptic. A timeout was performed prior to the initiation of the procedure. Under sterile conditions and local anesthesia, ultrasound micropuncture access performed of the patent right internal jugular vein. Guidewire inserted followed by the 4 [place] dilator. Amplatz guidewire advanced. Tract dilatation performed to insert a 20 cm temporary dialysis catheter. Tip position at the SVC RA junction. Blood aspirated easily followed by saline and heparin flushes. External caps applied. Patient tolerated the procedure well. Images obtained for documentation. No immediate complication. Sterile dressing applied at site. IMPRESSION: Successful ultrasound and fluoroscopic right IJ temporary dialysis catheter. Ready for use. Electronically Signed   By: Judie PetitM.  Shick M.D.   On: 01/25/2016 12:43   .  diclofenac sodium  2 g Topical QID  . heparin  5,000 Units Subcutaneous Q8H  . ramelteon  8 mg Oral QHS  . sodium chloride flush  3 mL Intravenous Q12H    BMET    Component Value Date/Time   NA 136 01/27/2016 0811   K 3.6 01/27/2016 0811   CL 102 01/27/2016 0811   CO2 24 01/27/2016 0811   GLUCOSE 115 (H) 01/27/2016 0811   BUN 37 (H) 01/27/2016 0811   CREATININE 5.77 (H) 01/27/2016 0811   CALCIUM 7.8 (L) 01/27/2016 0811   GFRNONAA 9 (L) 01/27/2016 0811   GFRAA 10 (L) 01/27/2016 0811   CBC    Component Value Date/Time   WBC 10.0 01/27/2016 0811   RBC 3.49 (L) 01/27/2016 0811   HGB 10.6 (L) 01/27/2016 0811   HCT 30.8 (L) 01/27/2016 0811   PLT 123 (L) 01/27/2016 0811   MCV 88.3 01/27/2016 0811   MCH 30.4 01/27/2016 0811   MCHC 34.4 01/27/2016 0811   RDW 13.2 01/27/2016 0811   LYMPHSABS 0.9 01/23/2016 0451   MONOABS 1.0 01/23/2016 0451   EOSABS 0.0 01/23/2016 0451   BASOSABS 0.0 01/23/2016 0451     Assessment/Plan:  1. AKI- related to severe rhabdomyolysis.  Remains oliguric.  S/p HD x  2. Plan for HD again today.  2. Rhabdomyolysis- cpk levels dropping with HD.   3. Right thigh hematoma/edema- worrisome for compartment syndrome.  Improved exam with [facility] ortho following. 4. Substance abuse 5. Acute hepatitis- Hep C ab +, viral load elevated at 7,000.  Also likely shock liver to explain abnormal LFT's.  Will need ID evaluation. 6. Anemia 7. Hypocalcemia- improved with HD  Irena CordsJoseph A Robecca Fulgham

## 2016-01-28 ENCOUNTER — Encounter (HOSPITAL_COMMUNITY): Payer: Self-pay | Admitting: Physical Medicine and Rehabilitation

## 2016-01-28 ENCOUNTER — Inpatient Hospital Stay (HOSPITAL_COMMUNITY): Payer: Self-pay

## 2016-01-28 DIAGNOSIS — R220 Localized swelling, mass and lump, head: Secondary | ICD-10-CM

## 2016-01-28 LAB — CBC
HCT: 30 % — ABNORMAL LOW (ref 36.0–46.0)
HEMOGLOBIN: 10.3 g/dL — AB (ref 12.0–15.0)
MCH: 30.1 pg (ref 26.0–34.0)
MCHC: 34.3 g/dL (ref 30.0–36.0)
MCV: 87.7 fL (ref 78.0–100.0)
PLATELETS: 109 10*3/uL — AB (ref 150–400)
RBC: 3.42 MIL/uL — AB (ref 3.87–5.11)
RDW: 13.5 % (ref 11.5–15.5)
WBC: 14.2 10*3/uL — AB (ref 4.0–10.5)

## 2016-01-28 LAB — COMPREHENSIVE METABOLIC PANEL
ALT: 335 U/L — AB (ref 14–54)
ANION GAP: 10 (ref 5–15)
AST: 356 U/L — ABNORMAL HIGH (ref 15–41)
Albumin: 2 g/dL — ABNORMAL LOW (ref 3.5–5.0)
Alkaline Phosphatase: 49 U/L (ref 38–126)
BUN: 34 mg/dL — ABNORMAL HIGH (ref 6–20)
CHLORIDE: 102 mmol/L (ref 101–111)
CO2: 24 mmol/L (ref 22–32)
CREATININE: 5.81 mg/dL — AB (ref 0.44–1.00)
Calcium: 8 mg/dL — ABNORMAL LOW (ref 8.9–10.3)
GFR, EST AFRICAN AMERICAN: 10 mL/min — AB (ref >60)
GFR, EST NON AFRICAN AMERICAN: 9 mL/min — AB (ref >60)
Glucose, Bld: 95 mg/dL (ref 65–99)
Potassium: 3.8 mmol/L (ref 3.5–5.1)
Sodium: 136 mmol/L (ref 135–145)
Total Bilirubin: 1.9 mg/dL — ABNORMAL HIGH (ref 0.3–1.2)
Total Protein: 4.5 g/dL — ABNORMAL LOW (ref 6.5–8.1)

## 2016-01-28 LAB — CK: CK TOTAL: 10359 U/L — AB (ref 38–234)

## 2016-01-28 MED ORDER — SODIUM CHLORIDE 0.9 % IV SOLN
INTRAVENOUS | Status: DC
  Administered 2016-01-28 – 2016-02-02 (×6): via INTRAVENOUS

## 2016-01-28 NOTE — Progress Notes (Signed)
Patient ID: Alejandra FerronRebecca D Park, female   DOB: 14-Aug-1985, 30 y.o.   MRN: [id number] S:feeling a little better but still very sore O:BP 119/74 (BP Location: Left Arm)   Pulse 87   Temp 99 F (37.2 C) (Oral)   Resp 16   Ht 5\' 7"  (1.702 m)   Wt 78.1 kg (172 lb 2.9 oz)   LMP 01/13/2016   SpO2 98%   BMI 26.97 kg/m   Intake/Output Summary (Last 24 hours) at 01/28/16 0909 Last data filed at 01/28/16 0600  Gross per 24 hour  Intake             1182 ml  Output             2334 ml  Net            -1152 ml   Intake/Output: I/O last 3 completed shifts: In: 1182 [P.O.:1182] Out: 5334 [Urine:150; Other:5184]  Intake/Output this shift:  No intake/output data recorded. Weight change: -4.2 kg (-9 lb 4.2 oz) Gen:WD WN WF in NAD CVS:no rub Resp:cta NWG:NFAOZHAbd:benign Ext:+edema   Recent Labs Lab 01/23/16 1330 01/23/16 1843 01/24/16 0243 01/24/16 1236 01/24/16 1916 01/25/16 0634 01/25/16 1152 01/26/16 0816 01/27/16 0811 01/28/16 0733  NA 136 137 138 137  --  139 137 137 136 136  K 5.7* 4.4 4.5 4.5  --  4.4 4.4 3.8 3.6 3.8  CL 108 107 108 112*  --  113* 112* 108 102 102  CO2 21* 21* 20* 18*  --  17* 18* 20* 24 24  GLUCOSE 96 120* 102* 99  --  87 99 83 115* 95  BUN 32* 36* 40* 43*  --  51* 51* 43* 37* 34*  CREATININE 2.30* 2.85* 3.36* 3.91*  --  5.21* 5.55* 5.66* 5.77* 5.81*  ALBUMIN 2.2* 2.4* 2.0*  --   --  1.9* 2.1* 2.0* 2.1* 2.0*  CALCIUM 5.6* 5.7* 5.6* 5.7*  --  6.4* 6.6* 7.3* 7.8* 8.0*  PHOS  --   --   --   --  4.2 4.6 4.7* 4.4  --   --   AST 1,706* 1,662* 1,500*  --   --  914*  --  633* 523* 356*  ALT 625* 648* 583*  --   --  509*  --  401* 391* 335*   Liver Function Tests:  Recent Labs Lab 01/26/16 0816 01/27/16 0811 01/28/16 0733  AST 633* 523* 356*  ALT 401* 391* 335*  ALKPHOS 52 53 49  BILITOT 1.3* 1.8* 1.9*  PROT 4.4* 4.8* 4.5*  ALBUMIN 2.0* 2.1* 2.0*   No results for input(s): LIPASE, AMYLASE in the last 168 hours. No results for input(s): AMMONIA in the last 168  hours. CBC:  Recent Labs Lab 01/23/16 0451 01/24/16 0243 01/25/16 0634 01/26/16 0816 01/27/16 0811 01/28/16 0733  WBC 14.1* 12.2* 9.7 8.5 10.0 14.2*  NEUTROABS 12.3*  --   --   --   --   --   HGB 15.8* 11.2* 10.1* 9.6* 10.6* 10.3*  HCT 47.1* 33.0* 29.8* 28.2* 30.8* 30.0*  MCV 92.0 90.4 90.0 89.0 88.3 87.7  PLT 232 147* 140* 136* 123* PENDING   Cardiac Enzymes:  Recent Labs Lab 01/25/16 0634 01/25/16 1152 01/25/16 1753 01/26/16 0023 01/27/16 0551  CKTOTAL 47,830* >50,000* >50,000* 35,607* 18,807*   CBG:  Recent Labs Lab 01/23/16 0807  GLUCAP 276*    Iron Studies: No results for input(s): IRON, TIBC, TRANSFERRIN, FERRITIN in the last 72 hours. Studies/Results: [place] Renal  Result Date: 01/27/2016 CLINICAL DATA:  Acute renal failure EXAM: RENAL / URINARY TRACT ULTRASOUND COMPLETE COMPARISON:  CT abdomen 02/13/2015 FINDINGS: Right Kidney: Length: 12 cm. Echogenicity within normal limits. 12 x 11 x 15 mm anechoic right upper pole renal mass most consistent with a small cyst. No solid mass or hydronephrosis visualized. Left Kidney: Length: 12.7 cm. Echogenicity within normal limits. No mass or hydronephrosis visualized. Bladder: Appears normal for degree of bladder distention. IMPRESSION: No obstructive uropathy. Electronically Signed   By: Elige KoHetal  Patel   On: 01/27/2016 11:09   Dg Femur, Min 2 Views Right  Result Date: 01/27/2016 CLINICAL DATA:  Pain and swelling view right thigh after syncopal episode last week. Patient had been landing in 1 position for a month unspecified amount of time. Diagnosed with rhabdomyolysis. EXAM: RIGHT FEMUR 2 VIEWS COMPARISON:  None. FINDINGS: There is no evidence of fracture or other focal bone lesions of the right femur including visualized hip and knee joints. There is mild scattered subcutaneous soft tissue induration of the thigh without focal soft tissue mass or calcifications. Tubal ligation clips noted in the pelvis. IMPRESSION: No acute  osseous abnormality nor bone destruction. Joint spaces are maintained. Mild scattered soft tissue induration of the right thigh. Electronically Signed   By: Tollie Ethavid  Kwon M.D.   On: 01/27/2016 14:03   . Chlorhexidine Gluconate Cloth  6 each Topical Q0600  . diclofenac sodium  2 g Topical QID  . heparin  5,000 Units Subcutaneous Q8H  . mupirocin ointment  1 application Nasal BID  . mupirocin ointment      . ramelteon  8 mg Oral QHS  . sodium chloride flush  3 mL Intravenous Q12H    BMET    Component Value Date/Time   NA 136 01/28/2016 0733   K 3.8 01/28/2016 0733   CL 102 01/28/2016 0733   CO2 24 01/28/2016 0733   GLUCOSE 95 01/28/2016 0733   BUN 34 (H) 01/28/2016 0733   CREATININE 5.81 (H) 01/28/2016 0733   CALCIUM 8.0 (L) 01/28/2016 0733   GFRNONAA 9 (L) 01/28/2016 0733   GFRAA 10 (L) 01/28/2016 0733   CBC    Component Value Date/Time   WBC 14.2 (H) 01/28/2016 0733   RBC 3.42 (L) 01/28/2016 0733   HGB 10.3 (L) 01/28/2016 0733   HCT 30.0 (L) 01/28/2016 0733   PLT PENDING 01/28/2016 0733   MCV 87.7 01/28/2016 0733   MCH 30.1 01/28/2016 0733   MCHC 34.3 01/28/2016 0733   RDW 13.5 01/28/2016 0733   LYMPHSABS 0.9 01/23/2016 0451   MONOABS 1.0 01/23/2016 0451   EOSABS 0.0 01/23/2016 0451   BASOSABS 0.0 01/23/2016 0451     Assessment/Plan:  1. AKI- related to severe rhabdomyolysis.  Remains oliguric.  S/p HD x 3.  1. Plan for HD again tomorrow as she remains oliguric but now starting to make some urine 2. Will add some IVF's as well in hopes that her renal function will return  2. Rhabdomyolysis- cpk levels dropping with HD.   3. Right thigh hematoma/edema- worrisome for compartment syndrome.  Improved exam with [facility] ortho following. 4. Substance abuse 5. Acute hepatitis- Hep C ab +, viral load elevated at 7,000.  Also likely shock liver to explain abnormal LFT's.  Will need ID evaluation. 6. Anemia 7. Hypocalcemia- improved with HD  Irena CordsJoseph A. Jasmeet Gehl, MD [facility] [phone]

## 2016-01-28 NOTE — Consult Note (Signed)
Physical Medicine and Rehabilitation Consult  Reason for Consult: BLE neuropathy due to rhabdomyolysis.  Referring Physician: Dr. Heide Spark   HPI: Alejandra Park is a 30 y.o. female with history of depression, opiate heroin abuse who sustained a fall during heroin use and was put to bed by roommate about 6 hours later. She was found by roommate few hours later with BLE weakness and inability to walk. She was admitted for work up on 11/31 and started on IVF for AKI due to rhabdomyolysis. Abnormal LFTs felt to be due to shocked liver. Patient with inability to void despite 11L of fluid with fluid overload and foley placed per nephrology input. She was started on HD due to anuria and developed bilateral thigh swelling with R>L side pain with numbness. Dr. Magnus Ivan consulted for input due to concerns of compartment syndrome and felt that BLE were well perfused and no need for surgical intervention. HD ongoing with improvement in fluid overload and has had initiation of urine output.  She as noted to have Hep C+ antibiotics with VR 7,000--ID consult recommended. PT evaluation done and patient with limitations in mobility due to BLE pain. CIR recommended for follow up therapy.    Review of Systems  HENT: Negative for hearing loss and tinnitus.   Respiratory: Negative for cough and shortness of breath.   Cardiovascular: Negative for chest pain and palpitations.  Gastrointestinal: Negative for constipation, heartburn and nausea.  Genitourinary: Negative for dysuria.  Musculoskeletal: Positive for back pain and myalgias.  Neurological: Positive for sensory change (pins and needles in feet--RLE>LLE) and focal weakness.  Psychiatric/Behavioral: Negative for depression. The patient is not nervous/anxious.       Past Medical History:  Diagnosis Date  . Bulging disc   . Chronic back pain   . Fracture of sternum   . Umbilical hernia     Past Surgical History:  Procedure Laterality Date  .  IR GENERIC HISTORICAL  01/25/2016   IR US GUIDE VASC ACCESS RIGHT 01/25/2016 Berdine Dance, MD [facility]  . IR GENERIC HISTORICAL  01/25/2016   IR FLUORO GUIDE CV LINE RIGHT 01/25/2016 Berdine Dance, MD [facility]  . KNEE SURGERY    . TUBAL LIGATION      Family History  Problem Relation Age of Onset  . Healthy Mother   . Healthy Father       Social History:  Lives with a friend--friend works days. Works a Horticulturist, commercial. She  reports that she has been smoking--1/2 PPD.  She does not have any smokeless tobacco history on file. She reports that she drinks alcohol---mixed drinks twice a month. She reports that she uses drugs, including IV.    Allergies: No Known Allergies    No prescriptions prior to admission.    Home: Home Living Family/patient expects to be discharged to:: Private residence Living Arrangements: Other (Comment) (roommate) Type of Home: House Home Access: Level entry Home Layout: One level Home Equipment: None  Functional History: Prior Function Level of Independence: Independent Functional Status:  Mobility: Bed Mobility Overal bed mobility: Needs Assistance Bed Mobility: Supine to Sit Supine to sit: Min assist, Supervision Sit to supine: Min assist General bed mobility comments: pt able to sit straight up in bed but has great difficulty moving RLE and allowing it to be moved because of pain. Min A given but pt with increased pain. So sheet placed around pt's R foot and she used it as a strap to assist off bed. She  seemed to tolerate this better.  Transfers Overall transfer level: Needs assistance Equipment used: Rolling walker (2 wheeled) Transfers: Sit to/from Stand, Stand Pivot Transfers Sit to Stand: +2 physical assistance, Min assist Stand pivot transfers: Min assist, +2 safety/equipment General transfer comment: pt able to stand to RW with min A +2 and take very small pivot steps to recliner. Could not tolerate standing > 1 min due to  pain Ambulation/Gait General Gait Details: unable due to pain    ADL:    Cognition: Cognition Overall Cognitive Status: Within Functional Limits for tasks assessed Orientation Level: Oriented X4 Cognition Arousal/Alertness: Awake/alert Behavior During Therapy: Agitated (very jittery, WD?) Overall Cognitive Status: Within Functional Limits for tasks assessed   Blood pressure 115/75, pulse 92, temperature 99.1 F (37.3 C), temperature source Oral, resp. rate 17, height 5\' 7"  (1.702 m), weight 78.1 kg (172 lb 2.9 oz), last menstrual period 01/13/2016, SpO2 98 %. Physical Exam  Nursing note and vitals reviewed. Constitutional: She is oriented to person, place, and time. She appears well-developed and well-nourished.  Restless and fidgeting--complaining of left buttock pain.   HENT:  Head: Normocephalic and atraumatic.  Mouth/Throat: Oropharynx is clear and moist.  Eyes: Conjunctivae are normal. Pupils are equal, round, and reactive to light. Right eye exhibits no discharge. Left eye exhibits no discharge.  Neck: Normal range of motion. Neck supple.  Cardiovascular: Normal rate and regular rhythm.   Respiratory: Effort normal and breath sounds normal. Stridor present. No respiratory distress. She has no wheezes.  GI: Soft. Bowel sounds are normal. She exhibits no distension. There is no tenderness.  Musculoskeletal: She exhibits edema and tenderness.  2+ edema RLE and min  Edema left thigh.  Sensitive to touch. Right thigh warm.  Neurological: She is alert and oriented to person, place, and time.  Speech clear. Follows basic commands without difficulty.  UE 5/5. RHF 1/5 RKE 2/5 RADF/PF 3/5. LHF 4-/5, LKE 4/5, LADF/PF 4+/5. No sensory deficts  Skin: Skin is warm and dry. She is not diaphoretic. No erythema.  Psychiatric: She has a normal mood and affect. Her speech is normal. Cognition and memory are normal. She expresses impulsivity. She is inattentive.    Results for orders placed  or performed during the hospital encounter of 01/23/16 (from the past 24 hour(s))  CK     Status: Abnormal   Collection Time: 01/28/16  7:33 AM  Result Value Ref Range   Total CK 10,359 (H) 38 - 234 U/L  Comprehensive metabolic panel     Status: Abnormal   Collection Time: 01/28/16  7:33 AM  Result Value Ref Range   Sodium 136 135 - 145 mmol/L   Potassium 3.8 3.5 - 5.1 mmol/L   Chloride 102 101 - 111 mmol/L   CO2 24 22 - 32 mmol/L   Glucose, Bld 95 65 - 99 mg/dL   BUN 34 (H) 6 - 20 mg/dL   Creatinine, Ser 1.195.81 (H) 0.44 - 1.00 mg/dL   Calcium 8.0 (L) 8.9 - 10.3 mg/dL   Total Protein 4.5 (L) 6.5 - 8.1 g/dL   Albumin 2.0 (L) 3.5 - 5.0 g/dL   AST 147356 (H) 15 - 41 U/L   ALT 335 (H) 14 - 54 U/L   Alkaline Phosphatase 49 38 - 126 U/L   Total Bilirubin 1.9 (H) 0.3 - 1.2 mg/dL   GFR calc non Af Amer 9 (L) >60 mL/min   GFR calc Af Amer 10 (L) >60 mL/min   Anion gap 10 5 -  15  CBC     Status: Abnormal   Collection Time: 01/28/16  7:33 AM  Result Value Ref Range   WBC 14.2 (H) 4.0 - 10.5 K/uL   RBC 3.42 (L) 3.87 - 5.11 MIL/uL   Hemoglobin 10.3 (L) 12.0 - 15.0 g/dL   HCT 13.230.0 (L) 44.036.0 - 10.246.0 %   MCV 87.7 78.0 - 100.0 fL   MCH 30.1 26.0 - 34.0 pg   MCHC 34.3 30.0 - 36.0 g/dL   RDW 72.513.5 36.611.5 - 44.015.5 %   Platelets 109 (L) 150 - 400 K/uL   [place] Renal  Result Date: 01/27/2016 CLINICAL DATA:  Acute renal failure EXAM: RENAL / URINARY TRACT ULTRASOUND COMPLETE COMPARISON:  CT abdomen 02/13/2015 FINDINGS: Right Kidney: Length: 12 cm. Echogenicity within normal limits. 12 x 11 x 15 mm anechoic right upper pole renal mass most consistent with a small cyst. No solid mass or hydronephrosis visualized. Left Kidney: Length: 12.7 cm. Echogenicity within normal limits. No mass or hydronephrosis visualized. Bladder: Appears normal for degree of bladder distention. IMPRESSION: No obstructive uropathy. Electronically Signed   By: Elige KoHetal  Patel   On: 01/27/2016 11:09   Dg Femur, Min 2 Views Right  Result Date:  01/27/2016 CLINICAL DATA:  Pain and swelling view right thigh after syncopal episode last week. Patient had been landing in 1 position for a month unspecified amount of time. Diagnosed with rhabdomyolysis. EXAM: RIGHT FEMUR 2 VIEWS COMPARISON:  None. FINDINGS: There is no evidence of fracture or other focal bone lesions of the right femur including visualized hip and knee joints. There is mild scattered subcutaneous soft tissue induration of the thigh without focal soft tissue mass or calcifications. Tubal ligation clips noted in the pelvis. IMPRESSION: No acute osseous abnormality nor bone destruction. Joint spaces are maintained. Mild scattered soft tissue induration of the right thigh. Electronically Signed   By: Tollie Ethavid  Kwon M.D.   On: 01/27/2016 14:03    Assessment/Plan: Diagnosis: rhabdomyolysis with profound right lower extremity weakness 1. Does the need for close, 24 hr/day medical supervision in concert with the patient's rehab needs make it unreasonable for this patient to be served in a less intensive setting? Yes 2. Co-Morbidities requiring supervision/potential complications: AKI, polysubstance abue 3. Due to bladder management, bowel management, safety, skin/wound care, disease management, medication administration, pain management and patient education, does the patient require 24 hr/day rehab nursing? Yes 4. Does the patient require coordinated care of a physician, rehab nurse, PT (1-2 hrs/day, 5 days/week) and OT (1-2 hrs/day, 5 days/week) to address physical and functional deficits in the context of the above medical diagnosis(es)? Yes Addressing deficits in the following areas: balance, endurance, locomotion, strength, transferring, bowel/bladder control, bathing, dressing, feeding, grooming, toileting and psychosocial support 5. Can the patient actively participate in an intensive therapy program of at least 3 hrs of therapy per day at least 5 days per week? Yes 6. The potential for  patient to make measurable gains while on inpatient rehab is excellent 7. Anticipated functional outcomes upon discharge from inpatient rehab are modified independent  with PT, modified independent with OT, n/a with SLP. 8. Estimated rehab length of stay to reach the above functional goals is: 7-10 days 9. Does the patient have adequate social supports and living environment to accommodate these discharge functional goals? Yes 10. Anticipated D/C setting: Home 11. Anticipated post D/C treatments: HH therapy 12. Overall Rehab/Functional Prognosis: excellent  RECOMMENDATIONS: This patient's condition is appropriate for continued rehabilitative care in the following  setting: CIR Patient has agreed to participate in recommended program. Yes Note that insurance prior authorization may be required for reimbursement for recommended care.  Comment: Rehab Admissions Coordinator to follow up.  Thanks,  Ranelle Oyster, MD, [place] [facility]     01/28/2016

## 2016-01-28 NOTE — Progress Notes (Signed)
Inpatient Rehabilitation  Patient was screened by Santresa Levett for appropriateness for an Inpatient Acute Rehab consult.  At this time, we are recommending Inpatient Rehab consult.  Please order consult if you are agreeable.  Dickson Kostelnik PT Inpatient Rehab Admissions Coordinator Cell [phone] Office [phone]    

## 2016-01-28 NOTE — Progress Notes (Signed)
Subjective: Patient's pain continues to improve. Swelling in her right thigh is going down. She continues to complain of intermittent paresthesias in her bilateral feet. She is complaining of pain in her left jaw especially with eating.   Objective:  Vital signs in last 24 hours: Vitals:   01/27/16 2249 01/27/16 2327 01/28/16 0545 01/28/16 1010  BP: 117/75 110/76 119/74 115/75  Pulse: 83 96 87 92  Resp:  17 16 17   Temp: 99 F (37.2 C) 99.2 F (37.3 C) 99 F (37.2 C) 99.1 F (37.3 C)  TempSrc: Oral Oral Oral Oral  SpO2: 97% 100% 98% 98%  Weight: 172 lb 2.9 oz (78.1 kg)     Height:       Physical Exam Constitutional: NAD HEENT: Mass- like structure over left mandible, non mobile, tender to palpation.  Cardiovascular:RRR, no murmurs, rubs, or gallops.  Pulmonary/Chest:CTAB, no wheezes, rales, or rhonchi.  Abdominal:Soft, non tender, non distended. +BS.  Extremities: Right thigh hematoma with swelling and erythema continues to improve, lower extremity sensation and motor function intact. Warm and well perfused, distal pulses 2+ bilaterally.  GU: Foley catheter in place, minimal amount of dark color urine in foley bag (~100 cc) Neurological:A&Ox3, CN II - XII grossly intact.  Skin: No rashes or erythema  Psychiatric:Normal mood and affect  Assessment/Plan:  AKI Secondary to Rhabdomyolysis: CK on admission >50,000 down trending with HD, 10,359 today. She received aggressive IVFs on admission (over 10L) and became oliguric the following day. Creatinine continues to rise, 2.26 on admission -> 5.81 today. Nephrology was consulted on Friday 12/1 and patient underwent dialysis Saturday 12/2  (net -2.5L), Sunday 12/3 (net -3.5L), and Monday 12/4 (net -2.1). She has started to make a very small amount of urine, UOP recorded as 150 cc over the past 24 hours and 100 cc in the foley bag this morning on rounds. Hopeful for return of renal function.  -- Nephrology following, appreciate  recs -- Plan for HD again tomorrow; Restart IVFs per nephrology recommendations  -- Trending daily CK -- Oxy 5 mg Q6 hrs prn for pain - low dose ok with improving liver function per pharmacy  -- Daily labs  Acute Hepatitis: In the setting of IV drug use and rhabdomyolysis. AST and ALT now down trending 1900 ->356 and 705 -->335 respectively. Hep C ab positive, viral load elevated at 7,000. Likely multifactorial due to viral hepatitis and shock liver after possible transient drop in BP while using heroin. HIV negative.  -- Avoid hepatotoxic meds -- Hep C genotype pending  -- Outpatient ID referral   Mass Left Mandible: Palpable non mobile mass like structure over left parotid glad, tender to palpation. Patient endorses worsening pain with mastication, unclear timeline. Possibly a hematoma or soft tissue bleed associated with her fall, however, her other soft tissue swelling and her right thigh hematoma have improved with HD and this remains unchanged since admission. Etiology also includes salivary gland tumor vs. Infection.  -- Discussed imaging modalities with radiology given her acute renal failure -- Facial CT non con for further evaluation   R Thigh Hematoma:Improving, no signs of compartment syndrome. Ortho was consulted and there is low suspicion for compartment syndrome at this time. Worked with PT yesterday and was able to tolerate partial weight bearing. -- Continue to monitor, vascular checks -- Ortho consulted -- PT/OT  Opioid use disorder:  -- HIV negative -- May consider Suboxone as outpatient  FEN: 50 cc/hr continuous, dialysis again tomorrow, monitoring electrolytes, regular  diet VTE ppx: Heparin SQ Code Status: FULL   Dispo: Anticipated discharge in approximately 3-4 days pending further HD and return of renal function.   Reymundo Pollarolyn Arwilda [place], MD 01/28/2016, 10:56 AM Pager: [phone]

## 2016-01-29 DIAGNOSIS — R220 Localized swelling, mass and lump, head: Secondary | ICD-10-CM

## 2016-01-29 DIAGNOSIS — M25461 Effusion, right knee: Secondary | ICD-10-CM

## 2016-01-29 DIAGNOSIS — M25561 Pain in right knee: Secondary | ICD-10-CM

## 2016-01-29 DIAGNOSIS — B1920 Unspecified viral hepatitis C without hepatic coma: Secondary | ICD-10-CM

## 2016-01-29 LAB — SYNOVIAL CELL COUNT + DIFF, W/ CRYSTALS
Crystals, Fluid: NONE SEEN
EOSINOPHILS-SYNOVIAL: 0 % (ref 0–1)
LYMPHOCYTES-SYNOVIAL FLD: 2 % (ref 0–20)
MONOCYTE-MACROPHAGE-SYNOVIAL FLUID: 75 % (ref 50–90)
NEUTROPHIL, SYNOVIAL: 23 % (ref 0–25)
Other Cells-SYN: 0
WBC, SYNOVIAL: 46 /mm3 (ref 0–200)

## 2016-01-29 LAB — RENAL FUNCTION PANEL
ANION GAP: 10 (ref 5–15)
Albumin: 1.9 g/dL — ABNORMAL LOW (ref 3.5–5.0)
BUN: 53 mg/dL — AB (ref 6–20)
CHLORIDE: 100 mmol/L — AB (ref 101–111)
CO2: 21 mmol/L — ABNORMAL LOW (ref 22–32)
Calcium: 8.1 mg/dL — ABNORMAL LOW (ref 8.9–10.3)
Creatinine, Ser: 7.08 mg/dL — ABNORMAL HIGH (ref 0.44–1.00)
GFR, EST AFRICAN AMERICAN: 8 mL/min — AB (ref >60)
GFR, EST NON AFRICAN AMERICAN: 7 mL/min — AB (ref >60)
Glucose, Bld: 93 mg/dL (ref 65–99)
PHOSPHORUS: 4.6 mg/dL (ref 2.5–4.6)
POTASSIUM: 4.5 mmol/L (ref 3.5–5.1)
Sodium: 131 mmol/L — ABNORMAL LOW (ref 135–145)

## 2016-01-29 LAB — CBC
HEMATOCRIT: 28.3 % — AB (ref 36.0–46.0)
HEMOGLOBIN: 9.7 g/dL — AB (ref 12.0–15.0)
MCH: 30.6 pg (ref 26.0–34.0)
MCHC: 34.3 g/dL (ref 30.0–36.0)
MCV: 89.3 fL (ref 78.0–100.0)
Platelets: 130 10*3/uL — ABNORMAL LOW (ref 150–400)
RBC: 3.17 MIL/uL — ABNORMAL LOW (ref 3.87–5.11)
RDW: 13.6 % (ref 11.5–15.5)
WBC: 14.5 10*3/uL — AB (ref 4.0–10.5)

## 2016-01-29 LAB — HEPATIC FUNCTION PANEL
ALBUMIN: 2 g/dL — AB (ref 3.5–5.0)
ALT: 291 U/L — AB (ref 14–54)
AST: 278 U/L — AB (ref 15–41)
Alkaline Phosphatase: 45 U/L (ref 38–126)
Bilirubin, Direct: 0.2 mg/dL (ref 0.1–0.5)
Indirect Bilirubin: 1.7 mg/dL — ABNORMAL HIGH (ref 0.3–0.9)
TOTAL PROTEIN: 4.3 g/dL — AB (ref 6.5–8.1)
Total Bilirubin: 1.9 mg/dL — ABNORMAL HIGH (ref 0.3–1.2)

## 2016-01-29 LAB — CK: CK TOTAL: 7180 U/L — AB (ref 38–234)

## 2016-01-29 LAB — GRAM STAIN

## 2016-01-29 MED ORDER — SODIUM CHLORIDE 0.9 % IV SOLN: 100.0000 mL | INTRAVENOUS | Status: DC | PRN

## 2016-01-29 MED ORDER — HEPARIN SODIUM (PORCINE) 1000 UNIT/ML DIALYSIS: 2000.0000 [IU] | INTRAMUSCULAR | Status: DC | PRN

## 2016-01-29 MED ORDER — LIDOCAINE-PRILOCAINE 2.5-2.5 % EX CREA
1.0000 | TOPICAL_CREAM | CUTANEOUS | Status: DC | PRN
Start: 2016-01-29 — End: 2016-01-29

## 2016-01-29 MED ORDER — ACETAMINOPHEN 325 MG PO TABS
650.0000 mg | ORAL_TABLET | Freq: Four times a day (QID) | ORAL | Status: DC | PRN
  Administered 2016-01-29 – 2016-01-31 (×5): 650 mg via ORAL
  Filled 2016-01-29 (×5): qty 2

## 2016-01-29 MED ORDER — HEPARIN SODIUM (PORCINE) 1000 UNIT/ML DIALYSIS: 1000.0000 [IU] | INTRAMUSCULAR | Status: DC | PRN

## 2016-01-29 MED ORDER — OXYCODONE HCL 5 MG PO TABS
ORAL_TABLET | ORAL | Status: AC
  Administered 2016-01-29: 5 mg via ORAL
  Filled 2016-01-29: qty 1

## 2016-01-29 MED ORDER — ALTEPLASE 2 MG IJ SOLR: 2.0000 mg | Freq: Once | INTRAMUSCULAR | Status: DC | PRN

## 2016-01-29 MED ORDER — LIDOCAINE HCL (PF) 1 % IJ SOLN: 5.0000 mL | INTRAMUSCULAR | Status: DC | PRN

## 2016-01-29 MED ORDER — PENTAFLUOROPROP-TETRAFLUOROETH EX AERO: 1.0000 "application " | INHALATION_SPRAY | CUTANEOUS | Status: DC | PRN

## 2016-01-29 MED ORDER — LIDOCAINE-PRILOCAINE 2.5-2.5 % EX CREA: 1.0000 "application " | TOPICAL_CREAM | CUTANEOUS | Status: DC | PRN

## 2016-01-29 MED ORDER — ACETAMINOPHEN 325 MG PO TABS
ORAL_TABLET | ORAL | Status: AC
  Administered 2016-01-29: 650 mg
  Filled 2016-01-29: qty 2

## 2016-01-29 MED ORDER — HEPARIN SODIUM (PORCINE) 1000 UNIT/ML DIALYSIS: 20.0000 [IU]/kg | INTRAMUSCULAR | Status: DC | PRN

## 2016-01-29 MED ORDER — HEPARIN SODIUM (PORCINE) 1000 UNIT/ML DIALYSIS: 40.0000 [IU]/kg | Freq: Once | INTRAMUSCULAR | Status: DC

## 2016-01-29 NOTE — Progress Notes (Addendum)
Subjective: Overall, patient feels that she is improving and continues to work well with PT. However, after getting out of bed yesterday and walking around, began to develop severe knee pain and swelling that has persisted. She also complains of swelling in her labia and is requesting her foley be removed due to irritation.   Objective:  Vital signs in last 24 hours: Vitals:   01/28/16 1010 01/28/16 1841 01/28/16 2155 01/29/16 0500  BP: 115/75 137/80 128/75 126/71  Pulse: 92 85 83 86  Resp: 17 18 17 18   Temp: 99.1 F (37.3 C) 98.6 F (37 C) 99 F (37.2 C) 98.7 F (37.1 C)  TempSrc: Oral Oral Oral Oral  SpO2: 98% 100% 100% 98%  Weight:   175 lb 11.2 oz (79.7 kg)   Height:   5\' 6"  (1.676 m)    Physical Exam Constitutional: NAD HEENT: Painful soft tissue enlargement over left mandible  Cardiovascular:RRR, no murmurs, rubs, or gallops.  Pulmonary/Chest:CTAB, no wheezes, rales, or rhonchi.  Abdominal:Soft, non tender, non distended. +BS.  Extremities: Right thigh hematoma with swelling and erythema continues to improve, lower extremity sensation and motor function intact. Warm and well perfused, distal pulses 2+ bilaterally. Right knee is very edematous and tender to palpation.  GU: Foley catheter in place, minimal amount of dark color urine in foley bag (~100 cc) Neurological:A&Ox3, CN II - XII grossly intact.  Skin: No rashes or erythema  Psychiatric:Normal mood and affect  Assessment/Plan:  AKI Secondary to Rhabdomyolysis: CK on admission >50,000 down trending with HD, 7,180 today. She received aggressive IVFs on admission (over 10L) and became oliguric the following day. Creatinine continues to rise, 2.26 on admission -> 7.08 today. Nephrology was consulted on Friday 12/1 and patient has undergone three HD sessions thus far - Saturday 12/2  (net -2.5L), Sunday 12/3 (net -3.5L), and Monday 12/4 (net -2.1). She has started to make a very small amount of urine, UOP recorded as  150 cc yesterday and 125 cc today. Will need HD tunnel catheter placed for long term dialysis if renal function does not return.  -- Nephrology following, appreciate recs -- Plan for HD today; continue IVFs per nephrology recommendations  -- Trending daily CK -- Oxy 5 mg Q6 hrs prn for pain - low dose ok with improving liver function per pharmacy  -- Daily labs -- Pull foley for voiding trial today per patient request   Acute Hepatitis: In the setting of IV drug use and rhabdomyolysis. AST and ALT down trending 1900 ->278 and 705 -->291 respectively. Hep C ab positive, viral load elevated at 7,000.Likely multifactorial due to viral hepatitis and shock liver after possible transient drop in BP while using heroin. HIV negative.  -- Avoid hepatotoxic meds -- Hep C genotype pending  -- Outpatient ID referral   Right Knee Pain and Swelling: Onset yesterday after getting out of bed to walk around. Very tender to palpation on exam. Patient does have a leukocytosis of 14.5 that was new as of yesterday. Afebrile. Concerning for gout vs. Septic arthritis.  -- Plan for knee aspiration; will sent for cultures and crystal evaluation   Left Facial Swelling: Palpable non mobile mass like structure over left mandible, tender to palpation. Patient endorses worsening pain with mastication, unclear timeline. Facial CT yesterday with enlarged masseter muscle and soft tissue swelling, likely posttraumatic from her fall. No osseous abnormalities.  -- Continue to monitor   R Thigh Hematoma:Improving, no signs of compartment syndrome. Ortho was consulted and there  is low suspicion for compartment syndrome at this time. Worked with PT yesterday and was able to tolerate partial weight bearing. -- Continue to monitor, vascular checks -- Ortho consulted -- PT/OT  Opioid use disorder:  -- HIV negative -- May consider Suboxone as outpatient  FEN: 50 cc/hr continuous, dialysis today, monitoring electrolytes,  regular diet VTE ppx: Heparin SQ Code Status: FULL   Dispo: Anticipated discharge in approximately 3-4 days pending further HD and return of renal function.   Alejandra Pollarolyn Aislinn Feliz, MD 01/29/2016, 8:53 AM Pager: [phone]

## 2016-01-29 NOTE — Progress Notes (Signed)
Inpatient Rehabilitation  I met with the patient at the bedside to discuss the recommendation for  IP Rehab.  I provided booklets and information about the CIR program.  My co-worker Gunnar Fusi  will follow along for potential need for IP Rehab, timing of medical readiness and bed availability.  Please call if questions.  [place] Admissions Coordinator Cell [phone] Office [phone]

## 2016-01-29 NOTE — Progress Notes (Signed)
Patient ID: Alejandra Park, female   DOB: [birth year], 30 y.o.   MRN: [id number] S:feels a little better O:BP 126/71 (BP Location: Right Arm)   Pulse 86   Temp 98.7 F (37.1 C) (Oral)   Resp 18   Ht 5\' 6"  (1.676 m)   Wt 79.7 kg (175 lb 11.2 oz)   LMP 01/13/2016   SpO2 98%   BMI 28.36 kg/m   Intake/Output Summary (Last 24 hours) at 01/29/16 0858 Last data filed at 01/29/16 0600  Gross per 24 hour  Intake          1703.33 ml  Output              125 ml  Net          1578.33 ml   Intake/Output: I/O last 3 completed shifts: In: 1703.3 [P.O.:1080; I.V.:623.3] Out: 2309 [Urine:125; Other:2184]  Intake/Output this shift:  No intake/output data recorded. Weight change: -0.603 kg (-1 lb 5.3 oz) Gen:WD WN WF in NAD CVS:no rub Resp:cta WUJ:WJXBJY Ext:tr edema   Recent Labs Lab 01/23/16 1843 01/24/16 0243 01/24/16 1236 01/24/16 1916 01/25/16 0634 01/25/16 1152 01/26/16 0816 01/27/16 0811 01/28/16 0733 01/29/16 0522 01/29/16 0721  NA 137 138 137  --  139 137 137 136 136 131*  --   K 4.4 4.5 4.5  --  4.4 4.4 3.8 3.6 3.8 4.5  --   CL 107 108 112*  --  113* 112* 108 102 102 100*  --   CO2 21* 20* 18*  --  17* 18* 20* 24 24 21*  --   GLUCOSE 120* 102* 99  --  87 99 83 115* 95 93  --   BUN 36* 40* 43*  --  51* 51* 43* 37* 34* 53*  --   CREATININE 2.85* 3.36* 3.91*  --  5.21* 5.55* 5.66* 5.77* 5.81* 7.08*  --   ALBUMIN 2.4* 2.0*  --   --  1.9* 2.1* 2.0* 2.1* 2.0* 1.9* 2.0*  CALCIUM 5.7* 5.6* 5.7*  --  6.4* 6.6* 7.3* 7.8* 8.0* 8.1*  --   PHOS  --   --   --  4.2 4.6 4.7* 4.4  --   --  4.6  --   AST 1,662* 1,500*  --   --  914*  --  633* 523* 356*  --  278*  ALT 648* 583*  --   --  509*  --  401* 391* 335*  --  291*   Liver Function Tests:  Recent Labs Lab 01/27/16 0811 01/28/16 0733 01/29/16 0522 01/29/16 0721  AST 523* 356*  --  278*  ALT 391* 335*  --  291*  ALKPHOS 53 49  --  45  BILITOT 1.8* 1.9*  --  1.9*  PROT 4.8* 4.5*  --  4.3*  ALBUMIN 2.1* 2.0* 1.9* 2.0*    No results for input(s): LIPASE, AMYLASE in the last 168 hours. No results for input(s): AMMONIA in the last 168 hours. CBC:  Recent Labs Lab 01/23/16 0451  01/25/16 0634 01/26/16 0816 01/27/16 0811 01/28/16 0733 01/29/16 0522  WBC 14.1*  < > 9.7 8.5 10.0 14.2* 14.5*  NEUTROABS 12.3*  --   --   --   --   --   --   HGB 15.8*  < > 10.1* 9.6* 10.6* 10.3* 9.7*  HCT 47.1*  < > 29.8* 28.2* 30.8* 30.0* 28.3*  MCV 92.0  < > 90.0 89.0 88.3 87.7 89.3  PLT 232  < >  140* 136* 123* 109* 130*  < > = values in this interval not displayed. Cardiac Enzymes:  Recent Labs Lab 01/25/16 1753 01/26/16 0023 01/27/16 0551 01/28/16 0733 01/29/16 0522  CKTOTAL >50,000* 35,607* 18,807* 10,359* 7,180*   CBG:  Recent Labs Lab 01/23/16 0807  GLUCAP 276*    Iron Studies: No results for input(s): IRON, TIBC, TRANSFERRIN, FERRITIN in the last 72 hours. Studies/Results: [place] Renal  Result Date: 01/27/2016 CLINICAL DATA:  Acute renal failure EXAM: RENAL / URINARY TRACT ULTRASOUND COMPLETE COMPARISON:  CT abdomen 02/13/2015 FINDINGS: Right Kidney: Length: 12 cm. Echogenicity within normal limits. 12 x 11 x 15 mm anechoic right upper pole renal mass most consistent with a small cyst. No solid mass or hydronephrosis visualized. Left Kidney: Length: 12.7 cm. Echogenicity within normal limits. No mass or hydronephrosis visualized. Bladder: Appears normal for degree of bladder distention. IMPRESSION: No obstructive uropathy. Electronically Signed   By: Elige KoHetal  Patel   On: 01/27/2016 11:09   Dg Femur, Min 2 Views Right  Result Date: 01/27/2016 CLINICAL DATA:  Pain and swelling view right thigh after syncopal episode last week. Patient had been landing in 1 position for a month unspecified amount of time. Diagnosed with rhabdomyolysis. EXAM: RIGHT FEMUR 2 VIEWS COMPARISON:  None. FINDINGS: There is no evidence of fracture or other focal bone lesions of the right femur including visualized hip and knee joints.  There is mild scattered subcutaneous soft tissue induration of the thigh without focal soft tissue mass or calcifications. Tubal ligation clips noted in the pelvis. IMPRESSION: No acute osseous abnormality nor bone destruction. Joint spaces are maintained. Mild scattered soft tissue induration of the right thigh. Electronically Signed   By: Tollie Ethavid  Kwon M.D.   On: 01/27/2016 14:03   Ct Maxillofacial Wo Contrast  Result Date: 01/28/2016 CLINICAL DATA:  Left-sided facial swelling since last Wednesday after fall EXAM: CT MAXILLOFACIAL WITHOUT CONTRAST TECHNIQUE: Multidetector CT imaging of the maxillofacial structures was performed. Multiplanar CT image reconstructions were also generated. A small metallic BB was placed on the right temple in order to reliably differentiate right from left. COMPARISON:  05/24/2011 CT head are there FINDINGS: Osseous: No fracture or mandibular dislocation. No destructive process. Orbits: Negative. No traumatic or inflammatory finding. Sinuses: Mild mucosal thickening of the ethmoid sinus. The sphenoid sinus, visualized maxillary and frontal sinus are clear. Mastoids are unremarkable. Soft tissues: There is asymmetry of the masseter muscles, left more prominent than right with overlying soft tissue subcutaneous fatty induration. This is likely posttraumatic in etiology given history and would correspond with the left sided facial swelling. No abnormal fluid collection or hematoma noted on this unenhanced study. No acute or active hemorrhage, mass or calcification is noted. Limited intracranial: Negative IMPRESSION: Asymmetric soft tissue induration overlying a thickened appearance of the left masseter muscle which may correspond with patient's history of fall. This may be related to posttraumatic soft tissue swelling. No acute osseous abnormality. Electronically Signed   By: Tollie Ethavid  Kwon M.D.   On: 01/28/2016 16:38   . Chlorhexidine Gluconate Cloth  6 each Topical Q0600  .  diclofenac sodium  2 g Topical QID  . heparin  5,000 Units Subcutaneous Q8H  . mupirocin ointment  1 application Nasal BID  . ramelteon  8 mg Oral QHS  . sodium chloride flush  3 mL Intravenous Q12H    BMET    Component Value Date/Time   NA 131 (L) 01/29/2016 0522   K 4.5 01/29/2016 0522   CL 100 (  L) 01/29/2016 0522   CO2 21 (L) 01/29/2016 0522   GLUCOSE 93 01/29/2016 0522   BUN 53 (H) 01/29/2016 0522   CREATININE 7.08 (H) 01/29/2016 0522   CALCIUM 8.1 (L) 01/29/2016 0522   GFRNONAA 7 (L) 01/29/2016 0522   GFRAA 8 (L) 01/29/2016 0522   CBC    Component Value Date/Time   WBC 14.5 (H) 01/29/2016 0522   RBC 3.17 (L) 01/29/2016 0522   HGB 9.7 (L) 01/29/2016 0522   HCT 28.3 (L) 01/29/2016 0522   PLT 130 (L) 01/29/2016 0522   MCV 89.3 01/29/2016 0522   MCH 30.6 01/29/2016 0522   MCHC 34.3 01/29/2016 0522   RDW 13.6 01/29/2016 0522   LYMPHSABS 0.9 01/23/2016 0451   MONOABS 1.0 01/23/2016 0451   EOSABS 0.0 01/23/2016 0451   BASOSABS 0.0 01/23/2016 0451     Assessment/Plan:  1. AKI- related to severe rhabdomyolysis. Remains oliguric. S/p HD x 3.  1. Plan for HD again today as she remains oliguric but now starting to make some urine 2. Will add some IVF's as well in hopes that her renal function will return  3. Will also need tunneled IJ HD catheter if no return of renal function  2. Rhabdomyolysis- cpk levels dropping with HD.  1. Has been evaluated  By CIR 3. Right thigh hematoma/edema- worrisome for compartment syndrome. Improved exam with UF ortho following. 4. Substance abuse 5. Acute hepatitis- Hep C ab +, viral load elevated at 7,000. Also likely shock liver to explain abnormal LFT's. Will need ID evaluation. 6. Anemia 7. Hypocalcemia- improved with HD  Irena CordsJoseph A. Malaky Tetrault, MD [facility] [phone]

## 2016-01-29 NOTE — Progress Notes (Signed)
Procedure Note: Right Knee Arthrocentesis   Indication: Right Knee Effusion   Operators: Drs Reymundo Pollarolyn Damareon Lanni / Carlynn PurlErik Hoffman   The patient was provided with risks and benefits of right knee arthrocentesis.  She consented to procedure.  After a time out was preformed, her right knee was prepped in a sterile fashion. An 18 gauge 1.5 inch needle was used to enter the joint space from the insertion site (superior lateral aspect of the patella while in a laying position, knee propped with towel). The knee space was entered successfully on the first attempt. 35 cc of yellow synovial fluid was removed. Patient tolerated the procedure well without immediate complication. Sent for cell count, gram stain, culture, and crystal analysis.   Reymundo Pollarolyn Aika Brzoska, M.D. Pager: [phone] 01/29/2016, 5:53 PM

## 2016-01-29 NOTE — Progress Notes (Signed)
Physical Therapy Treatment Patient Details Name: Rosalva FerronRebecca D Tourville MRN: [id number] DOB: 1986/01/22 Today's Date: 01/29/2016    History of Present Illness Pt adm with rhabdomyolosis after being on the floor at home for >6 hours after using heroin.  CK > 50,000. Pt also with AKI and acute hepatitis. PMH - substance abuse, chronic pain, depression    PT Comments    Progressing steadily, limited by R LE pain with gait/mobility.  Heavy use of the RW and ended up in the recliner for sitting tolerance.   Follow Up Recommendations  CIR     Equipment Recommendations  Rolling walker with 5" wheels    Recommendations for Other Services       Precautions / Restrictions Precautions Precautions: Fall Restrictions Weight Bearing Restrictions: No    Mobility  Bed Mobility Overal bed mobility: Needs Assistance Bed Mobility: Supine to Sit     Supine to sit: Modified independent (Device/Increase time)     General bed mobility comments: pt sat straight up and was able to drag legs OOB.  Effortful and painful, but no assist needed.  Transfers Overall transfer level: Needs assistance Equipment used: Rolling walker (2 wheeled) Transfers: Sit to/from Stand Sit to Stand: Min assist;+2 safety/equipment         General transfer comment: cues for hand placement and guard assist mainly  Ambulation/Gait Ambulation/Gait assistance: Min assist;Min guard Ambulation Distance (Feet): 28 Feet Assistive device: Rolling walker (2 wheeled) Gait Pattern/deviations: Step-through pattern     General Gait Details: heavy use of walker, straight-legged gait, short step length.  More painful as she progressed.   Stairs            Wheelchair Mobility    Modified Rankin (Stroke Patients Only)       Balance Overall balance assessment: Needs assistance Sitting-balance support: No upper extremity supported Sitting balance-Leahy Scale: Good       Standing balance-Leahy Scale:  Poor Standing balance comment: reliant on RW                    Cognition Arousal/Alertness: Awake/alert Behavior During Therapy: WFL for tasks assessed/performed Overall Cognitive Status: Within Functional Limits for tasks assessed                      Exercises General Exercises - Lower Extremity Long Arc Quad: AROM;AAROM;Both;10 reps;Seated Hip Flexion/Marching: AROM;AAROM;Both;10 reps;Seated    General Comments        Pertinent Vitals/Pain Pain Assessment: Faces Faces Pain Scale: Hurts whole lot Pain Location: R LE at knee and L hip Pain Descriptors / Indicators: Aching;Moaning;Grimacing;Sore Pain Intervention(s): Limited activity within patient's tolerance;Monitored during session    Home Living                      Prior Function            PT Goals (current goals can now be found in the care plan section) Acute Rehab PT Goals Patient Stated Goal: get better PT Goal Formulation: With patient Time For Goal Achievement: 02/07/16 Potential to Achieve Goals: Good Progress towards PT goals: Progressing toward goals    Frequency    Min 3X/week      PT Plan Current plan remains appropriate    Co-evaluation             End of Session   Activity Tolerance: Patient limited by pain Patient left: in chair;with call bell/phone within reach;with chair alarm set  Time: 1150-1210 PT Time Calculation (min) (ACUTE ONLY): 20 min  Charges:  $Gait Training: 8-22 mins                    G CodesEliseo Gum:      Birdena Kingma V Caidence Kaseman 01/29/2016, 1:44 PM 01/29/2016  [place] BingKen Lowana Hable, PT [phone]  (pager)

## 2016-01-30 ENCOUNTER — Encounter (HOSPITAL_COMMUNITY): Payer: Self-pay | Admitting: Physical Medicine and Rehabilitation

## 2016-01-30 DIAGNOSIS — S7011XA Contusion of right thigh, initial encounter: Secondary | ICD-10-CM

## 2016-01-30 DIAGNOSIS — M25561 Pain in right knee: Secondary | ICD-10-CM

## 2016-01-30 DIAGNOSIS — X58XXXA Exposure to other specified factors, initial encounter: Secondary | ICD-10-CM

## 2016-01-30 LAB — CBC
HCT: 28 % — ABNORMAL LOW (ref 36.0–46.0)
HEMOGLOBIN: 9.5 g/dL — AB (ref 12.0–15.0)
MCH: 29.9 pg (ref 26.0–34.0)
MCHC: 33.9 g/dL (ref 30.0–36.0)
MCV: 88.1 fL (ref 78.0–100.0)
Platelets: 138 10*3/uL — ABNORMAL LOW (ref 150–400)
RBC: 3.18 MIL/uL — ABNORMAL LOW (ref 3.87–5.11)
RDW: 13.4 % (ref 11.5–15.5)
WBC: 11.7 10*3/uL — ABNORMAL HIGH (ref 4.0–10.5)

## 2016-01-30 LAB — COMPREHENSIVE METABOLIC PANEL
ALBUMIN: 2.1 g/dL — AB (ref 3.5–5.0)
ALK PHOS: 40 U/L (ref 38–126)
ALT: 244 U/L — ABNORMAL HIGH (ref 14–54)
ANION GAP: 8 (ref 5–15)
AST: 199 U/L — ABNORMAL HIGH (ref 15–41)
BUN: 35 mg/dL — ABNORMAL HIGH (ref 6–20)
CO2: 26 mmol/L (ref 22–32)
Calcium: 7.8 mg/dL — ABNORMAL LOW (ref 8.9–10.3)
Chloride: 98 mmol/L — ABNORMAL LOW (ref 101–111)
Creatinine, Ser: 5.53 mg/dL — ABNORMAL HIGH (ref 0.44–1.00)
GFR calc Af Amer: 11 mL/min — ABNORMAL LOW (ref >60)
GFR calc non Af Amer: 9 mL/min — ABNORMAL LOW (ref >60)
GLUCOSE: 86 mg/dL (ref 65–99)
POTASSIUM: 3.6 mmol/L (ref 3.5–5.1)
SODIUM: 132 mmol/L — AB (ref 135–145)
Total Bilirubin: 1.2 mg/dL (ref 0.3–1.2)
Total Protein: 4.4 g/dL — ABNORMAL LOW (ref 6.5–8.1)

## 2016-01-30 LAB — HEPATITIS C GENOTYPE

## 2016-01-30 LAB — CK: CK TOTAL: 4541 U/L — AB (ref 38–234)

## 2016-01-30 MED ORDER — METHOCARBAMOL 500 MG PO TABS
1500.0000 mg | ORAL_TABLET | Freq: Four times a day (QID) | ORAL | Status: DC | PRN
  Administered 2016-01-30 – 2016-02-10 (×33): 1500 mg via ORAL
  Filled 2016-01-30 (×34): qty 3

## 2016-01-30 NOTE — Progress Notes (Signed)
Patient ID: Alejandra FerronRebecca D Park, female   DOB: 1985/12/27, 30 y.o.   MRN: [id number] S:Feels a little better  O:BP 121/72 (BP Location: Right Arm)   Pulse 91   Temp 98.9 F (37.2 C) (Oral)   Resp 18   Ht 5\' 6"  (1.676 m)   Wt 79.7 kg (175 lb 11.3 oz)   LMP 01/13/2016   SpO2 99%   BMI 28.36 kg/m   Intake/Output Summary (Last 24 hours) at 01/30/16 0839 Last data filed at 01/30/16 0600  Gross per 24 hour  Intake             1560 ml  Output             2125 ml  Net             -565 ml   Intake/Output: I/O last 3 completed shifts: In: 2723.3 [P.O.:900; I.V.:1823.3] Out: 2125 [Urine:125; Other:2000]  Intake/Output this shift:  No intake/output data recorded. Weight change: 2.003 kg (4 lb 6.7 oz) Gen:WD WN WF in NAD CVS:no rub Resp:cta WUJ:WJXBJYAbd:benign Ext: tr edema   Recent Labs Lab 01/24/16 0243  01/24/16 1916 01/25/16 0634 01/25/16 1152 01/26/16 0816 01/27/16 0811 01/28/16 0733 01/29/16 0522 01/29/16 0721 01/30/16 0306  NA 138  < >  --  139 137 137 136 136 131*  --  132*  K 4.5  < >  --  4.4 4.4 3.8 3.6 3.8 4.5  --  3.6  CL 108  < >  --  113* 112* 108 102 102 100*  --  98*  CO2 20*  < >  --  17* 18* 20* 24 24 21*  --  26  GLUCOSE 102*  < >  --  87 99 83 115* 95 93  --  86  BUN 40*  < >  --  51* 51* 43* 37* 34* 53*  --  35*  CREATININE 3.36*  < >  --  5.21* 5.55* 5.66* 5.77* 5.81* 7.08*  --  5.53*  ALBUMIN 2.0*  --   --  1.9* 2.1* 2.0* 2.1* 2.0* 1.9* 2.0* 2.1*  CALCIUM 5.6*  < >  --  6.4* 6.6* 7.3* 7.8* 8.0* 8.1*  --  7.8*  PHOS  --   --  4.2 4.6 4.7* 4.4  --   --  4.6  --   --   AST 1,500*  --   --  914*  --  633* 523* 356*  --  278* 199*  ALT 583*  --   --  509*  --  401* 391* 335*  --  291* 244*  < > = values in this interval not displayed. Liver Function Tests:  Recent Labs Lab 01/28/16 0733 01/29/16 0522 01/29/16 0721 01/30/16 0306  AST 356*  --  278* 199*  ALT 335*  --  291* 244*  ALKPHOS 49  --  45 40  BILITOT 1.9*  --  1.9* 1.2  PROT 4.5*  --  4.3* 4.4*   ALBUMIN 2.0* 1.9* 2.0* 2.1*   No results for input(s): LIPASE, AMYLASE in the last 168 hours. No results for input(s): AMMONIA in the last 168 hours. CBC:  Recent Labs Lab 01/26/16 0816 01/27/16 0811 01/28/16 0733 01/29/16 0522 01/30/16 0306  WBC 8.5 10.0 14.2* 14.5* 11.7*  HGB 9.6* 10.6* 10.3* 9.7* 9.5*  HCT 28.2* 30.8* 30.0* 28.3* 28.0*  MCV 89.0 88.3 87.7 89.3 88.1  PLT 136* 123* 109* 130* 138*   Cardiac Enzymes:  Recent Labs  Lab 01/26/16 0023 01/27/16 0551 01/28/16 0733 01/29/16 0522 01/30/16 0306  CKTOTAL 35,607* 18,807* 10,359* 7,180* 4,541*   CBG: No results for input(s): GLUCAP in the last 168 hours.  Iron Studies: No results for input(s): IRON, TIBC, TRANSFERRIN, FERRITIN in the last 72 hours. Studies/Results: Ct Maxillofacial Wo Contrast  Result Date: 01/28/2016 CLINICAL DATA:  Left-sided facial swelling since last Wednesday after fall EXAM: CT MAXILLOFACIAL WITHOUT CONTRAST TECHNIQUE: Multidetector CT imaging of the maxillofacial structures was performed. Multiplanar CT image reconstructions were also generated. A small metallic BB was placed on the right temple in order to reliably differentiate right from left. COMPARISON:  05/24/2011 CT head are there FINDINGS: Osseous: No fracture or mandibular dislocation. No destructive process. Orbits: Negative. No traumatic or inflammatory finding. Sinuses: Mild mucosal thickening of the ethmoid sinus. The sphenoid sinus, visualized maxillary and frontal sinus are clear. Mastoids are unremarkable. Soft tissues: There is asymmetry of the masseter muscles, left more prominent than right with overlying soft tissue subcutaneous fatty induration. This is likely posttraumatic in etiology given history and would correspond with the left sided facial swelling. No abnormal fluid collection or hematoma noted on this unenhanced study. No acute or active hemorrhage, mass or calcification is noted. Limited intracranial: Negative  IMPRESSION: Asymmetric soft tissue induration overlying a thickened appearance of the left masseter muscle which may correspond with patient's history of fall. This may be related to posttraumatic soft tissue swelling. No acute osseous abnormality. Electronically Signed   By: Tollie Ethavid  Kwon M.D.   On: 01/28/2016 16:38   . Chlorhexidine Gluconate Cloth  6 each Topical Q0600  . diclofenac sodium  2 g Topical QID  . heparin  5,000 Units Subcutaneous Q8H  . mupirocin ointment  1 application Nasal BID  . ramelteon  8 mg Oral QHS  . sodium chloride flush  3 mL Intravenous Q12H    BMET    Component Value Date/Time   NA 132 (L) 01/30/2016 0306   K 3.6 01/30/2016 0306   CL 98 (L) 01/30/2016 0306   CO2 26 01/30/2016 0306   GLUCOSE 86 01/30/2016 0306   BUN 35 (H) 01/30/2016 0306   CREATININE 5.53 (H) 01/30/2016 0306   CALCIUM 7.8 (L) 01/30/2016 0306   GFRNONAA 9 (L) 01/30/2016 0306   GFRAA 11 (L) 01/30/2016 0306   CBC    Component Value Date/Time   WBC 11.7 (H) 01/30/2016 0306   RBC 3.18 (L) 01/30/2016 0306   HGB 9.5 (L) 01/30/2016 0306   HCT 28.0 (L) 01/30/2016 0306   PLT 138 (L) 01/30/2016 0306   MCV 88.1 01/30/2016 0306   MCH 29.9 01/30/2016 0306   MCHC 33.9 01/30/2016 0306   RDW 13.4 01/30/2016 0306   LYMPHSABS 0.9 01/23/2016 0451   MONOABS 1.0 01/23/2016 0451   EOSABS 0.0 01/23/2016 0451   BASOSABS 0.0 01/23/2016 0451     Assessment/Plan:  1. AKI- related to severe rhabdomyolysis. Remains oliguric. S/p HD x 3.  1. Plan for HD again today as she remains oliguric but now starting to make some urine 2. Will add some IVF's as well in hopes that her renal function will return 3. Will also need tunneled IJ HD catheter if no return of renal function by early next week. 4. Will hold off on HD tomorrow and see how she responds to increased IVF's . 2. Rhabdomyolysis- cpk levels dropping with HD.  1. Has been evaluated  By CIR 3. Right thigh hematoma/edema- worrisome for  compartment syndrome. Improved exam with  UF ortho following. 4. Substance abuse 5. Acute hepatitis- Hep C ab +, viral load elevated at 7,000. Also likely shock liver to explain abnormal LFT's. Will need ID evaluation. 6. Anemia 7. Hypocalcemia- improved with HD   Irena Cords, MD [facility] [phone]

## 2016-01-30 NOTE — Progress Notes (Signed)
   Subjective: Patient continues to improve, working well with PT. Foley was pulled yesterday and she reports two episodes of UOP, approximately 100 cc each. Pain overall improving.   Objective:  Vital signs in last 24 hours: Vitals:   01/29/16 1609 01/29/16 1741 01/30/16 0014 01/30/16 0638  BP: 104/72 (!) 121/58 113/71 121/72  Pulse: 95 (!) 106 90 91  Resp: 16 (!) 7 18 18   Temp: 98 F (36.7 C) 98 F (36.7 C) 99.3 F (37.4 C) 98.9 F (37.2 C)  TempSrc: Oral Oral Oral Oral  SpO2: 98% 99% 99% 99%  Weight: 175 lb 11.3 oz (79.7 kg)     Height:       Physical Exam Constitutional: NAD Cardiovascular:RRR, no murmurs, rubs, or gallops.  Pulmonary/Chest:CTAB, no wheezes, rales, or rhonchi.  Abdominal:Soft, non tender, non distended. +BS.  Extremities: Right thigh hematoma with swelling and erythema continues to improve, lower extremity sensation andmotor function intact. Warm and well perfused, distal pulses 2+ bilaterally. Right knee is less edematous than yesterday. Neurological:A&Ox3, CN II - XII grossly intact.  Skin: No rashes or erythema  Psychiatric:Normal mood and affect  Assessment/Plan:  AKI Secondary to Rhabdomyolysis:CK on admission >50,000 down trending with HD, 4,541 today. S/p 4 HD sessions thus far, with ~ 10L removed. She has started to make a very small amount of urine, UOP 125 cc /24 hours today. Creatinine finally peaked yesterday at 7.08 and down to 5.53 today after 4th HD session. Started on gentle fluids per nephrology. Will need HD tunnel catheter placed for long term dialysis if renal function does not return.  -- Nephrology following, appreciate recs -- IVFs per nephrology recommendations  -- Trending daily CK -- Oxy 5 mg Q6 hrs prn for pain -- Daily labs  Acute Hepatitis: In the setting of IV drug use and rhabdomyolysis. AST and ALT down trending 1900 -> 199 and 705 --> 244 respectively. Hep C ab positive, viral load elevated at 7,000.Likely  multifactorial due to viral hepatitis and shock liver after possible transient drop in BP while using heroin. HIV negative.  -- Avoid hepatotoxic meds -- Hep C genotype pending  -- Outpatient ID referral   Right Knee Pain and Swelling: Acute onset with associated leukocytosis. Initially concerning for gout vs. Septic arthritis. Underwent right knee arthrocentesis yesterday with 35 cc synovial fluid removed. Rare wbc but no organisms on gram stain. Fluid was clear and negative for crystals. Cultures pending. Improved today. -- F/u cultures   Left Facial Swelling:Tender to palpation. Patient endorses worsening pain with mastication, unclear timeline. Facial CT with enlarged masseter muscle and soft tissue swelling, likely posttraumatic from her fall. No osseous abnormalities.  -- Continue to monitor   R Thigh Hematoma:Improving, no signs of compartment syndrome. Ortho was consulted and there is low suspicion for compartment syndrome at this time. Now weight bearing with PT and tolerating well.  -- Continue to monitor, vascular checks -- Ortho consulted -- PT/OT  Opioid use disorder:  -- HIV negative -- May consider Suboxone as outpatient  FEN: IVF per nephro, dialysis today, monitoring electrolytes, regular diet VTE ppx: Heparin SQ Code Status: FULL   Dispo: Anticipated discharge in approximately 3-4 days pending further HD and hopeful return of renal function.   Alejandra Pollarolyn Reuben Knoblock, MD 01/30/2016, 8:59 AM Pager: [phone]

## 2016-01-30 NOTE — Progress Notes (Signed)
Was paged that the patient had a fall while ambulating after using the restroom. I went and evaluated the patient after this fall. She said that her right knee felt weak and gave out all she was stepping over and IV cord. She did not lose consciousness. She denied a feeling of lightheadedness or dizziness prior to the fall. She did not hit her head. She landed on her buttocks and is only minimally sore. Her lower extremity muscle strength and sensation are intact and unchanged. The fall seemed mechanical in nature and I do not think further workup or management is needed at this time. Please continue to monitor overnight.

## 2016-01-30 NOTE — Progress Notes (Signed)
   01/30/16 1814  What Happened  Was fall witnessed? Yes  Who witnessed fall? Morrie SheldonAshley, NT  Patients activity before fall ambulating-assisted  Point of contact buttocks  Was patient injured? No  Follow Up  MD notified IM resident  Time MD notified [id number]  Family notified No- patient refusal  Additional tests No  Simple treatment Other (comment) (Pain meds for leg)  Progress note created (see row info) Yes  Adult Fall Risk Assessment  Risk Factor Category (scoring not indicated) High fall risk per protocol (document High fall risk)  Patient's Fall Risk High Fall Risk (>13 points)  Adult Fall Risk Interventions  Required Bundle Interventions *See Row Information* High fall risk - low, moderate, and high requirements implemented  Additional Interventions Individualized elimination schedule;Use of appropriate toileting equipment (bedpan, BSC, etc.)  Screening for Fall Injury Risk  Risk For Fall Injury- See Row Information  F;Nurse judgement   NT was Helping pt to and from bathroom. When pt was walking back from bathroom to was he her hands her legs began to buckle from pain/weakness. Then NT was unable to help lift the pt up so she assisted in gently sitting the patient to the floor. RN notified and went to the room to assess. PT was near by ans assisted patient back up and into bed.

## 2016-01-30 NOTE — Care Management Note (Signed)
Case Management Note  Patient Details  Name: Alejandra FerronRebecca [name] MRN: [id number] Date of Birth: [birth year]  Subjective/Objective:  AKI s/t rhabdomyolysis                Action/Plan: Discharge Planning: Chart reviewed. CIR recommended and pt being followed by IP rehab. Waiting final recommendations. Will continue to follow for dc needs. Pt states she does not have insurance or PCP. Lives with a room-mate. Will utilize [facility] and provide pt with info on self-pay clinics in the community.     Expected Discharge Date:                  Expected Discharge Plan:  IP Rehab Facility  In-House Referral:  Clinical Social Work  Discharge planning Services  CM Consult  Post Acute Care Choice:  NA Choice offered to:  NA  DME Arranged:  N/A DME Agency:  NA  HH Arranged:  NA HH Agency:  NA  Status of Service:  In process, will continue to follow  If discussed at Long Length of Stay Meetings, dates discussed:    Additional Comments:  [name], [name], RN 01/30/2016, 3:23 PM

## 2016-01-31 ENCOUNTER — Inpatient Hospital Stay (HOSPITAL_COMMUNITY): Payer: Self-pay

## 2016-01-31 LAB — BASIC METABOLIC PANEL
Anion gap: 12 (ref 5–15)
BUN: 57 mg/dL — ABNORMAL HIGH (ref 6–20)
CALCIUM: 7.9 mg/dL — AB (ref 8.9–10.3)
CO2: 21 mmol/L — ABNORMAL LOW (ref 22–32)
CREATININE: 6.81 mg/dL — AB (ref 0.44–1.00)
Chloride: 100 mmol/L — ABNORMAL LOW (ref 101–111)
GFR calc non Af Amer: 7 mL/min — ABNORMAL LOW (ref >60)
GFR, EST AFRICAN AMERICAN: 8 mL/min — AB (ref >60)
Glucose, Bld: 83 mg/dL (ref 65–99)
Potassium: 4.1 mmol/L (ref 3.5–5.1)
SODIUM: 133 mmol/L — AB (ref 135–145)

## 2016-01-31 LAB — GRAM STAIN

## 2016-01-31 LAB — SYNOVIAL CELL COUNT + DIFF, W/ CRYSTALS
Crystals, Fluid: NONE SEEN
Eosinophils-Synovial: 0 % (ref 0–1)
LYMPHOCYTES-SYNOVIAL FLD: 13 % (ref 0–20)
MONOCYTE-MACROPHAGE-SYNOVIAL FLUID: 53 % (ref 50–90)
NEUTROPHIL, SYNOVIAL: 34 % — AB (ref 0–25)
WBC, Synovial: 11 /mm3 (ref 0–200)

## 2016-01-31 MED ORDER — FUROSEMIDE 10 MG/ML IJ SOLN
120.0000 mg | Freq: Once | INTRAVENOUS | Status: AC
  Administered 2016-01-31: 120 mg via INTRAVENOUS
  Filled 2016-01-31: qty 12

## 2016-01-31 MED ORDER — OXYCODONE HCL 5 MG PO TABS
5.0000 mg | ORAL_TABLET | Freq: Once | ORAL | Status: AC
  Administered 2016-01-31: 5 mg via ORAL

## 2016-01-31 MED ORDER — LIDOCAINE HCL (PF) 1 % IJ SOLN
INTRAMUSCULAR | Status: AC
Start: 2016-01-31 — End: 2016-01-31
  Administered 2016-01-31: 30 mL
  Filled 2016-01-31: qty 30

## 2016-01-31 MED ORDER — OXYCODONE-ACETAMINOPHEN 5-325 MG PO TABS
1.0000 | ORAL_TABLET | Freq: Four times a day (QID) | ORAL | Status: DC | PRN
  Administered 2016-01-31 – 2016-02-09 (×28): 1 via ORAL
  Filled 2016-01-31 (×27): qty 1

## 2016-01-31 NOTE — Progress Notes (Signed)
Subjective: Patient was walking back from bathroom yesterday with walker and RN assistance when her knees gave out. RN caught patient and helped her slowly to the ground. PT was also in the room and assisted patient back to bed. Since her fall, her right knee has become very painful and swollen again. Complains that she can no longer bend that knee. Requesting repeat arthrocentesis for fluid removal.   Objective:  Vital signs in last 24 hours: Vitals:   01/30/16 1100 01/30/16 1803 01/30/16 2012 01/31/16 0540  BP: 120/69 134/77 101/83 129/72  Pulse: 80 97 94 89  Resp: 18 18 19 20   Temp: 98.1 F (36.7 C) 98.5 F (36.9 C) 99 F (37.2 C) 98.5 F (36.9 C)  TempSrc: Oral Oral Oral Oral  SpO2: 100% 100% 100% 100%  Weight:      Height:       Physical Exam Constitutional: NAD Cardiovascular:RRR, no murmurs, rubs, or gallops.  Pulmonary/Chest:CTAB, no wheezes, rales, or rhonchi.  Abdominal:Soft, non tender, non distended. +BS.  Extremities: Right thigh hematoma with swelling and erythema continues to improve, lower extremity sensation andmotor function intact. Warm and well perfused, distal pulses 2+ bilaterally. Right knee is very swollen and enlarged - worse than yesterday. Limited ROM. Neurological:A&Ox3, CN II - XII grossly intact.  Skin: No rashes or erythema  Psychiatric:Normal mood and affect  Assessment/Plan:  AKI Secondary to Rhabdomyolysis:CK on admission >50,000 down to 4,500 yesterday with dialysis. Stopped trending today. S/p 4 HD sessions thus far, with ~ 10L removed. She has started to make a very small amount of urine, UOP 200 cc /24 hours today. Creatinine finally peaked at 7.08 and was down to 5.53 yesterday after 4th HD session, unfortunately is back up to 6.81 today. Started on gentle fluids per nephrology. Will need HD tunnel catheter placed for long term dialysis if renal function does not return.  -- Nephrology following, appreciate recs -- IVFs per  nephrology recommendations  -- Will stop trending CK  -- Percocet q6hrs prn for pain  -- Daily labs  Right Knee Effusion: S/p diagnostic arthrocentesis on 12/6, ~35 cc of synovial fluid removed. Fluid was clear and negative for crystals. Cultures NGTD. Pain and swelling improved significantly afterwards, unfortunately fluid has reaccumulated and knee is very swollen and painful today. -- Cultures negative for growth 24 hours -- Plan for therapeutic arthrocentesis today -- Right knee and thigh MRI (w/o contrast)   Acute Hepatitis: In the setting of IV drug use and rhabdomyolysis. AST and ALT down1900 -> 199 and 705 --> 244 respectively. Hep C ab positive, viral load elevated at 7,000.Likely multifactorial due to viral hepatitis and shock liver after possible transient drop in BP while using heroin. HIV negative. Will stop trending transaminases.  -- Avoid hepatotoxic meds -- Outpatient ID referral   Left Facial Swelling:Tender to palpation. Patient endorses worsening pain with mastication, unclear timeline. Facial CT with enlarged masseter muscle and soft tissue swelling, likely posttraumatic from her fall. No osseous abnormalities.  -- Continue to monitor   R Thigh Hematoma:Improving, no signs of compartment syndrome. Ortho was consulted and there is low suspicion for compartment syndrome at this time. Now weight bearing with PT and tolerating well.  -- Continue to monitor -- PT/OT  Opioid use disorder:  -- HIV negative -- May consider Suboxone as outpatient  FEN: IVF per nephro, dialysis today, monitoring electrolytes, regular diet VTE ppx: Heparin SQ Code Status: FULL   Dispo: Anticipated discharge pending further HD and hopeful return  of renal function.  Reymundo Pollarolyn Brigg Cape, MD 01/31/2016, 7:21 AM Pager: [phone]

## 2016-01-31 NOTE — Progress Notes (Addendum)
Patient ID: Rosalva FerronRebecca D Lemieux, female   DOB: May 12, 1985, 30 y.o.   MRN: [id number] S: events of last pm noted.  C/o worsening right knee pain. O:BP 129/72 (BP Location: Right Arm)   Pulse 89   Temp 98.5 F (36.9 C) (Oral)   Resp 20   Ht 5\' 6"  (1.676 m)   Wt 79.7 kg (175 lb 11.3 oz)   LMP 01/13/2016   SpO2 100%   BMI 28.36 kg/m   Intake/Output Summary (Last 24 hours) at 01/31/16 0852 Last data filed at 01/31/16 0600  Gross per 24 hour  Intake          2649.16 ml  Output              100 ml  Net          2549.16 ml   Intake/Output: I/O last 3 completed shifts: In: 3849.2 [P.O.:600; I.V.:3249.2] Out: 200 [Urine:200]  Intake/Output this shift:  No intake/output data recorded. Weight change:  Gen:WD WN WF in nad CVS:no rub Resp:cta XLK:GMWNUUAbd:benign Ext:+edema, right knee +effusion.    Recent Labs Lab 01/24/16 1916  01/25/16 [id number] 01/25/16 1152 01/26/16 0816 01/27/16 0811 01/28/16 0733 01/29/16 0522 01/29/16 0721 01/30/16 0306 01/31/16 0527  NA  --   --  139 137 137 136 136 131*  --  132* 133*  K  --   --  4.4 4.4 3.8 3.6 3.8 4.5  --  3.6 4.1  CL  --   --  113* 112* 108 102 102 100*  --  98* 100*  CO2  --   --  17* 18* 20* 24 24 21*  --  26 21*  GLUCOSE  --   --  87 99 83 115* 95 93  --  86 83  BUN  --   --  51* 51* 43* 37* 34* 53*  --  35* 57*  CREATININE  --   --  5.21* 5.55* 5.66* 5.77* 5.81* 7.08*  --  5.53* 6.81*  ALBUMIN  --   < > 1.9* 2.1* 2.0* 2.1* 2.0* 1.9* 2.0* 2.1*  --   CALCIUM  --   --  6.4* 6.6* 7.3* 7.8* 8.0* 8.1*  --  7.8* 7.9*  PHOS 4.2  --  4.6 4.7* 4.4  --   --  4.6  --   --   --   AST  --   --  914*  --  633* 523* 356*  --  278* 199*  --   ALT  --   --  509*  --  401* 391* 335*  --  291* 244*  --   < > = values in this interval not displayed. Liver Function Tests:  Recent Labs Lab 01/28/16 0733 01/29/16 0522 01/29/16 0721 01/30/16 0306  AST 356*  --  278* 199*  ALT 335*  --  291* 244*  ALKPHOS 49  --  45 40  BILITOT 1.9*  --  1.9* 1.2  PROT  4.5*  --  4.3* 4.4*  ALBUMIN 2.0* 1.9* 2.0* 2.1*   No results for input(s): LIPASE, AMYLASE in the last 168 hours. No results for input(s): AMMONIA in the last 168 hours. CBC:  Recent Labs Lab 01/26/16 0816 01/27/16 0811 01/28/16 0733 01/29/16 0522 01/30/16 0306  WBC 8.5 10.0 14.2* 14.5* 11.7*  HGB 9.6* 10.6* 10.3* 9.7* 9.5*  HCT 28.2* 30.8* 30.0* 28.3* 28.0*  MCV 89.0 88.3 87.7 89.3 88.1  PLT 136* 123* 109* 130* 138*   Cardiac  Enzymes:  Recent Labs Lab 01/26/16 0023 01/27/16 0551 01/28/16 0733 01/29/16 0522 01/30/16 0306  CKTOTAL 35,607* 18,807* 10,359* 7,180* 4,541*   CBG: No results for input(s): GLUCAP in the last 168 hours.  Iron Studies: No results for input(s): IRON, TIBC, TRANSFERRIN, FERRITIN in the last 72 hours. Studies/Results: No results found. . Chlorhexidine Gluconate Cloth  6 each Topical Q0600  . diclofenac sodium  2 g Topical QID  . heparin  5,000 Units Subcutaneous Q8H  . mupirocin ointment  1 application Nasal BID  . ramelteon  8 mg Oral QHS  . sodium chloride flush  3 mL Intravenous Q12H    BMET    Component Value Date/Time   NA 133 (L) 01/31/2016 0527   K 4.1 01/31/2016 0527   CL 100 (L) 01/31/2016 0527   CO2 21 (L) 01/31/2016 0527   GLUCOSE 83 01/31/2016 0527   BUN 57 (H) 01/31/2016 0527   CREATININE 6.81 (H) 01/31/2016 0527   CALCIUM 7.9 (L) 01/31/2016 0527   GFRNONAA 7 (L) 01/31/2016 0527   GFRAA 8 (L) 01/31/2016 0527   CBC    Component Value Date/Time   WBC 11.7 (H) 01/30/2016 0306   RBC 3.18 (L) 01/30/2016 0306   HGB 9.5 (L) 01/30/2016 0306   HCT 28.0 (L) 01/30/2016 0306   PLT 138 (L) 01/30/2016 0306   MCV 88.1 01/30/2016 0306   MCH 29.9 01/30/2016 0306   MCHC 33.9 01/30/2016 0306   RDW 13.4 01/30/2016 0306   LYMPHSABS 0.9 01/23/2016 0451   MONOABS 1.0 01/23/2016 0451   EOSABS 0.0 01/23/2016 0451   BASOSABS 0.0 01/23/2016 0451     Assessment/Plan:  1. AKI- related to severe rhabdomyolysis.  Remains oliguric but  starting to make some.  Have started IVF's to see if this will help but will continue with IHD for now.  Will give one dose of IV lasix and arrange for another session of HD tomorrow and continue to monitor UOP. 2. Rhabdomyolysis- due to heroin overdose.  cpk levels dropping. 3. Right thigh hematoma/edema- improving.  Ortho following 4. Substance abuse- will need counseling 5. Acute hepatitis- Hep C Ab+ viral load 7,000.  Will need ID evaluation 6. Anemia-  7. Disposition- unclear if her renal failure will be reversible.  Being evaluated by inpatient rehab.  Will likely require tunneled HD catheter if not signfiicant renal function returns.  R temp hd cath has been in place since 01/25/16  Irena CordsJoseph A. Seaborn Nakama, MD [facility] [phone]

## 2016-01-31 NOTE — Procedures (Signed)
Knee Arthrocentesis without Injection Procedure Note  Diagnosis: right knee effusion  Indications: Symptomatic relief of large effusion  Anesthesia: Lidocaine 1% without epinephrine  Procedure Details   Point of care ultrasound was used to identify the joint effusion and plan needle trajectory and depth. Consent was obtained for the procedure. The joint was prepped with Betadine. A wheel of 1.5 mL 1% lidocaine was injected underneath the skin. A 22 gauge needle was inserted into the superior aspect of the joint from a lateral approach to access the suprapatellar pouch. 41 ml of cloudy yellow fluid was removed from the joint and sent to the lab for analysis. The needle was removed and the area cleansed and dressed.  Complications:  None; patient tolerated the procedure well.

## 2016-01-31 NOTE — Progress Notes (Signed)
PT Cancellation Note  Patient Details Name: Rosalva FerronRebecca D Filley MRN: [id number] DOB: 1986-02-18   Cancelled Treatment:    Reason Eval/Treat Not Completed: Patient declined, no reason specified.  Pt's knee hurting post aspiration, leg/groin swelling.  Will see as able. 01/31/2016  [place] BingKen Medora Roorda, PT [phone]  (pager)   Eliseo GumKenneth V Nayana Lenig 01/31/2016, 4:50 PM

## 2016-01-31 NOTE — Progress Notes (Signed)
Inpatient Rehabilitation  I am following along for determination of acute versus chronic HD, potential need for IP Rehab given tolerance, timing of overall medical readiness, and bed availability.  Plan for my co-worker Weldon PickingSusan Blankenship to follow up Monday.  Please call with questions.  Charlane FerrettiMelissa Rosabella Edgin, M.A., CCC/SLP Admission Coordinator  [facility]  Cell [phone]

## 2016-02-01 LAB — COMPREHENSIVE METABOLIC PANEL
ALBUMIN: 2.1 g/dL — AB (ref 3.5–5.0)
ALK PHOS: 33 U/L — AB (ref 38–126)
ALT: 161 U/L — ABNORMAL HIGH (ref 14–54)
AST: 87 U/L — ABNORMAL HIGH (ref 15–41)
Anion gap: 10 (ref 5–15)
BILIRUBIN TOTAL: 1.5 mg/dL — AB (ref 0.3–1.2)
BUN: 70 mg/dL — AB (ref 6–20)
CALCIUM: 8.1 mg/dL — AB (ref 8.9–10.3)
CO2: 21 mmol/L — ABNORMAL LOW (ref 22–32)
Chloride: 102 mmol/L (ref 101–111)
Creatinine, Ser: 7.99 mg/dL — ABNORMAL HIGH (ref 0.44–1.00)
GFR calc Af Amer: 7 mL/min — ABNORMAL LOW (ref >60)
GFR, EST NON AFRICAN AMERICAN: 6 mL/min — AB (ref >60)
GLUCOSE: 86 mg/dL (ref 65–99)
Potassium: 4.6 mmol/L (ref 3.5–5.1)
Sodium: 133 mmol/L — ABNORMAL LOW (ref 135–145)
TOTAL PROTEIN: 4 g/dL — AB (ref 6.5–8.1)

## 2016-02-01 LAB — CBC
HEMATOCRIT: 24.6 % — AB (ref 36.0–46.0)
Hemoglobin: 8.3 g/dL — ABNORMAL LOW (ref 12.0–15.0)
MCH: 30 pg (ref 26.0–34.0)
MCHC: 33.7 g/dL (ref 30.0–36.0)
MCV: 88.8 fL (ref 78.0–100.0)
Platelets: 190 10*3/uL (ref 150–400)
RBC: 2.77 MIL/uL — ABNORMAL LOW (ref 3.87–5.11)
RDW: 14.1 % (ref 11.5–15.5)
WBC: 11.6 10*3/uL — AB (ref 4.0–10.5)

## 2016-02-01 LAB — CK: Total CK: 1643 U/L — ABNORMAL HIGH (ref 38–234)

## 2016-02-01 MED ORDER — ACETAMINOPHEN 325 MG PO TABS
ORAL_TABLET | ORAL | Status: AC
  Administered 2016-02-01: 650 mg via ORAL
  Filled 2016-02-01: qty 2

## 2016-02-01 MED ORDER — OXYCODONE-ACETAMINOPHEN 5-325 MG PO TABS
ORAL_TABLET | ORAL | Status: AC
  Administered 2016-02-01: 1 via ORAL
  Filled 2016-02-01: qty 1

## 2016-02-01 MED ORDER — ACETAMINOPHEN 325 MG PO TABS
650.0000 mg | ORAL_TABLET | Freq: Once | ORAL | Status: AC
Start: 2016-02-01 — End: 2016-02-01
  Administered 2016-02-01: 650 mg via ORAL

## 2016-02-01 MED ORDER — HEPARIN SODIUM (PORCINE) 1000 UNIT/ML DIALYSIS: 20.0000 [IU]/kg | INTRAMUSCULAR | Status: DC | PRN

## 2016-02-01 NOTE — Progress Notes (Signed)
Patient ID: Alejandra FerronRebecca D Park, female   DOB: [birth year], 30 y.o.   MRN: [id number] S:feels a little better today O:BP 124/69 (BP Location: Right Arm)   Pulse 94   Temp 98.5 F (36.9 C) (Oral)   Resp 20   Ht 5\' 6"  (1.676 m)   Wt 79.7 kg (175 lb 11.3 oz)   LMP 01/13/2016   SpO2 99%   BMI 28.36 kg/m   Intake/Output Summary (Last 24 hours) at 02/01/16 0849 Last data filed at 02/01/16 [id number]  Gross per 24 hour  Intake             3240 ml  Output              215 ml  Net             3025 ml   Intake/Output: I/O last 3 completed shifts: In: 5409.2 [P.O.:960; I.V.:4449.2] Out: 215 [Urine:175; Other:40]  Intake/Output this shift:  No intake/output data recorded. Weight change:  Gen:wd wn wf in nad CVS:no rub Resp:cta UXL:KGMWNUAbd:benign Ext:+edema   Recent Labs Lab 01/25/16 1152 01/26/16 0816 01/27/16 0811 01/28/16 0733 01/29/16 0522 01/29/16 0721 01/30/16 0306 01/31/16 0527 02/01/16 0552  NA 137 137 136 136 131*  --  132* 133* 133*  K 4.4 3.8 3.6 3.8 4.5  --  3.6 4.1 4.6  CL 112* 108 102 102 100*  --  98* 100* 102  CO2 18* 20* 24 24 21*  --  26 21* 21*  GLUCOSE 99 83 115* 95 93  --  86 83 86  BUN 51* 43* 37* 34* 53*  --  35* 57* 70*  CREATININE 5.55* 5.66* 5.77* 5.81* 7.08*  --  5.53* 6.81* 7.99*  ALBUMIN 2.1* 2.0* 2.1* 2.0* 1.9* 2.0* 2.1*  --  2.1*  CALCIUM 6.6* 7.3* 7.8* 8.0* 8.1*  --  7.8* 7.9* 8.1*  PHOS 4.7* 4.4  --   --  4.6  --   --   --   --   AST  --  633* 523* 356*  --  278* 199*  --  87*  ALT  --  401* 391* 335*  --  291* 244*  --  161*   Liver Function Tests:  Recent Labs Lab 01/29/16 0721 01/30/16 0306 02/01/16 0552  AST 278* 199* 87*  ALT 291* 244* 161*  ALKPHOS 45 40 33*  BILITOT 1.9* 1.2 1.5*  PROT 4.3* 4.4* 4.0*  ALBUMIN 2.0* 2.1* 2.1*   No results for input(s): LIPASE, AMYLASE in the last 168 hours. No results for input(s): AMMONIA in the last 168 hours. CBC:  Recent Labs Lab 01/27/16 0811 01/28/16 0733 01/29/16 0522 01/30/16 0306  02/01/16 0552  WBC 10.0 14.2* 14.5* 11.7* 11.6*  HGB 10.6* 10.3* 9.7* 9.5* 8.3*  HCT 30.8* 30.0* 28.3* 28.0* 24.6*  MCV 88.3 87.7 89.3 88.1 88.8  PLT 123* 109* 130* 138* 190   Cardiac Enzymes:  Recent Labs Lab 01/27/16 0551 01/28/16 0733 01/29/16 0522 01/30/16 0306 02/01/16 0552  CKTOTAL 18,807* 10,359* 7,180* 4,541* 1,643*   CBG: No results for input(s): GLUCAP in the last 168 hours.  Iron Studies: No results for input(s): IRON, TIBC, TRANSFERRIN, FERRITIN in the last 72 hours. Studies/Results: No results found. . Chlorhexidine Gluconate Cloth  6 each Topical Q0600  . diclofenac sodium  2 g Topical QID  . heparin  5,000 Units Subcutaneous Q8H  . mupirocin ointment  1 application Nasal BID  . ramelteon  8 mg Oral QHS  . sodium  chloride flush  3 mL Intravenous Q12H    BMET    Component Value Date/Time   NA 133 (L) 02/01/2016 0552   K 4.6 02/01/2016 0552   CL 102 02/01/2016 0552   CO2 21 (L) 02/01/2016 0552   GLUCOSE 86 02/01/2016 0552   BUN 70 (H) 02/01/2016 0552   CREATININE 7.99 (H) 02/01/2016 0552   CALCIUM 8.1 (L) 02/01/2016 0552   GFRNONAA 6 (L) 02/01/2016 0552   GFRAA 7 (L) 02/01/2016 0552   CBC    Component Value Date/Time   WBC 11.6 (H) 02/01/2016 0552   RBC 2.77 (L) 02/01/2016 0552   HGB 8.3 (L) 02/01/2016 0552   HCT 24.6 (L) 02/01/2016 0552   PLT 190 02/01/2016 0552   MCV 88.8 02/01/2016 0552   MCH 30.0 02/01/2016 0552   MCHC 33.7 02/01/2016 0552   RDW 14.1 02/01/2016 0552   LYMPHSABS 0.9 01/23/2016 0451   MONOABS 1.0 01/23/2016 0451   EOSABS 0.0 01/23/2016 0451   BASOSABS 0.0 01/23/2016 0451     Assessment/Plan:  1. AKI- related to severe rhabdomyolysis.  Remains oliguric but starting to make some.  Have started IVF's to see if this will help but will continue with IHD for now.   1. No significant response to dose of IV lasix 2. Plan for another session of HD today 3. Will need tunneled HD catheter on Monday as she is not having any  renal recovery.  She will also likely require AVF/AVG 4. continue to monitor UOP. 5. Will d/c temp HD catheter today after HD as it is tender and has been in place for 1 week.  Will need tunneled HD catheter on Monday/Tuesday if no return of renal function.  2. Rhabdomyolysis- due to heroin overdose.  cpk levels dropping. 3. Right thigh hematoma/edema- improving.  Ortho following 4. Substance abuse- will need counseling 5. Acute hepatitis- Hep C Ab+ viral load 7,000.  Will need ID evaluation 6. Anemia-  7. Disposition- unclear if her renal failure will be reversible.  Being evaluated by inpatient rehab.  Will likely require tunneled HD catheter if not signfiicant renal function returns.  R temp hd cath has been in place since 01/25/16  Irena CordsJoseph A. Treston Coker, MD [facility] [phone]

## 2016-02-01 NOTE — Progress Notes (Signed)
Ace wrap applied to RLE per MD order and ice pack applied.  Patient tolerated well.

## 2016-02-01 NOTE — Progress Notes (Signed)
MRI reviewed and shows changes consistent with rhabdo and myositis.  No clinical signs of compartment syndrome.  No indication to perform compartmental manometry.  ACE compression wrap ordered to help with swelling as well as ice packs.  Elevation.

## 2016-02-01 NOTE — Progress Notes (Signed)
Subjective: Patient continues to have pain and swelling in her right leg and knee. Improved some after therapeutic arthrocentesis yesterday, 40 cc of clear synovial fluid removed. Reports urinating 4 times since yesterday, says she does not think it was all recorded by the RN. Otherwise pain is improving.   Objective:  Vital signs in last 24 hours: Vitals:   01/31/16 0540 01/31/16 0938 01/31/16 2229 02/01/16 0538  BP: 129/72 123/68 122/62 124/69  Pulse: 89 80 88 94  Resp: 20 18 19 20   Temp: 98.5 F (36.9 C) 98.3 F (36.8 C) 98.5 F (36.9 C) 98.5 F (36.9 C)  TempSrc: Oral Oral Oral Oral  SpO2: 100% 100% 100% 99%  Weight:      Height:       Physical Exam Constitutional: NAD Cardiovascular:RRR, no murmurs, rubs, or gallops.  Pulmonary/Chest:CTAB, no wheezes, rales, or rhonchi.  Abdominal:Soft, non tender, non distended. +BS.  Extremities: Right thigh hematoma with diffuse swelling continues to improve, lower extremity sensation andmotor function intact. Warm and well perfused, distal pulses 2+ bilaterally. Right knee is swollen and enlarged - improved after arthrocentesis. ROM improved today. GU: Left labial swelling - no evidence of fluid collection or abscess. Mildly erythematous, no drainage.  Neurological:A&Ox3, CN II - XII grossly intact.  Skin: No rashes or erythema  Psychiatric:Normal mood and affect   Assessment/Plan:  AKI Secondary to Rhabdomyolysis:CK on admission >50,000 down to 1642 today. S/p 4 HD sessions thus far, with ~ 10L removed.She has started to make a very small amount of urine, UOP 175 cc /24 hourstoday. Creatinine finally peaked at 7.08 and was down to 5.53 after 4th HD session, unfortunately is back up to 6.81 yesterday and 7.99 today. Started on gentle fluids and lasix per nephrology. Will need HD tunnel catheter placed for long term dialysis if renal function does not return.  -- Nephrology following, appreciate recs -- IVFs and lasix per  nephrology recommendations  -- Percocet q6hrs prn for pain  -- Daily labs  Recurrent Right Knee Effusion: S/p diagnostic arthrocentesis on 12/6, ~35 cc of synovial fluid removed. Fluid was clear and negative for crystals. Cultures NGTD. Pain and swelling initially improved, unfortunately fluid reaccumulated and required a second arthrocentesis yesterday with another 40 cc removed. Better today but a moderate effusion persists. MRI yesterday showed effusion and possible tear of of medial patellofemoral ligament. Discussed with ortho, no need for surgery at this time. Outpatient elective surgery if pain persists.  -- Cultures NGTD -- Continue to monitor   R Thigh Hematoma:Improving, no signs of compartment syndrome. Ortho was consulted and there is low suspicion for compartment syndrome at this time. Now weight bearing with PT and tolerating well.  -- Ortho consult, appreciate recommendations -- Ice, ACE compression wrap, and elevation for swelling -- Continue to monitor -- PT/OT  Acute Hepatitis: In the setting of IV drug use and rhabdomyolysis. AST and ALT down1900 -> 87 and 705 -->161 respectively. Hep C ab positive, viral load elevated at 7,000.Likely multifactorial due to viral hepatitis and shock liver after possible transient drop in BP while using heroin. HIV negative.  -- Avoid hepatotoxic meds -- Outpatient ID referral   Left Facial Swelling: Improved.Facial CT with enlarged masseter muscle and soft tissue swelling, likely posttraumatic from her fall. No osseous abnormalities.  -- Continue to monitor   Opioid use disorder:  -- HIV negative -- May consider Suboxone as outpatient  FEN: IVF and lasix per nephro, dialysis today, monitoring electrolytes, regular diet VTE ppx:  Heparin SQ Code Status: FULL    Dispo: Anticipated discharge pending further HD and hopeful return of renal function.  Alejandra Alejandra Alejandra Schiff, MD 02/01/2016, 9:58 AM Pager: [phone]

## 2016-02-01 NOTE — Progress Notes (Signed)
Physical Therapy Treatment Patient Details Name: Alejandra FerronRebecca D Choe MRN: [id number] DOB: [birth year] Today's Date: 02/01/2016    History of Present Illness Pt adm with rhabdomyolosis after being on the floor at home for >6 hours after using heroin.  CK > 50,000. Pt also with AKI and acute hepatitis. PMH - substance abuse, chronic pain, depression    PT Comments    Noticeably improved, though still painful and limited.  Emphasized standing exercise, transfer safety, and gait.  Pt has start doing her own exercise.  Discussed keeping her exercise moderated.  Follow Up Recommendations  CIR     Equipment Recommendations  Rolling walker with 5" wheels    Recommendations for Other Services       Precautions / Restrictions Precautions Precautions: Fall    Mobility  Bed Mobility                  Transfers Overall transfer level: Needs assistance Equipment used: Rolling walker (2 wheeled) Transfers: Sit to/from Stand Sit to Stand: Supervision Stand pivot transfers: Min guard       General transfer comment: cues for hand placement  Ambulation/Gait Ambulation/Gait assistance: Min assist Ambulation Distance (Feet): 50 Feet Assistive device: Rolling walker (2 wheeled) Gait Pattern/deviations: Step-through pattern   Gait velocity interpretation: Below normal speed for age/gender General Gait Details: moderate use of the RW (moderate use of the RW, able to bend her knees a little.)   Stairs            Wheelchair Mobility    Modified Rankin (Stroke Patients Only)       Balance Overall balance assessment: Needs assistance Sitting-balance support: No upper extremity supported Sitting balance-Leahy Scale: Good     Standing balance support: Bilateral upper extremity supported Standing balance-Leahy Scale: Poor Standing balance comment: reliant on RW                    Cognition Arousal/Alertness: Awake/alert Behavior During Therapy: WFL for tasks  assessed/performed Overall Cognitive Status: Within Functional Limits for tasks assessed                      Exercises General Exercises - Lower Extremity Long Arc Quad: AROM;AAROM;Both;10 reps;Seated Heel Slides: AROM;Strengthening;Both;10 reps;Standing Straight Leg Raises: AROM;Strengthening;Both;10 reps;Standing Mini-Sqauts: AROM;Both;10 reps;Standing    General Comments        Pertinent Vitals/Pain Pain Assessment: Faces Faces Pain Scale: Hurts even more Pain Location: R LE at knee and L hip, L 4th toes Pain Descriptors / Indicators: Aching;Moaning;Grimacing;Sore Pain Intervention(s): Monitored during session    Home Living                      Prior Function            PT Goals (current goals can now be found in the care plan section) Acute Rehab PT Goals PT Goal Formulation: With patient Time For Goal Achievement: 02/07/16 Potential to Achieve Goals: Good Progress towards PT goals: Progressing toward goals    Frequency    Min 3X/week      PT Plan Current plan remains appropriate    Co-evaluation             End of Session   Activity Tolerance: Patient limited by pain Patient left: in bed;with call bell/phone within reach;with nursing/sitter in room     Time: 1320-1340 PT Time Calculation (min) (ACUTE ONLY): 20 min  Charges:  $Gait Training: 8-22 mins  G CodesEliseo Gum:      Eirene Rather V Brendan Gadson 02/01/2016, 3:35 PM 02/01/2016  [place] BingKen Tishia Maestre, PT [phone]  (pager)

## 2016-02-02 ENCOUNTER — Encounter (HOSPITAL_COMMUNITY): Payer: Self-pay | Admitting: Radiology

## 2016-02-02 ENCOUNTER — Inpatient Hospital Stay (HOSPITAL_COMMUNITY): Payer: Self-pay

## 2016-02-02 LAB — RENAL FUNCTION PANEL
Albumin: 2 g/dL — ABNORMAL LOW (ref 3.5–5.0)
Anion gap: 10 (ref 5–15)
BUN: 43 mg/dL — ABNORMAL HIGH (ref 6–20)
CO2: 25 mmol/L (ref 22–32)
Calcium: 7.7 mg/dL — ABNORMAL LOW (ref 8.9–10.3)
Chloride: 100 mmol/L — ABNORMAL LOW (ref 101–111)
Creatinine, Ser: 5.73 mg/dL — ABNORMAL HIGH (ref 0.44–1.00)
GFR calc Af Amer: 10 mL/min — ABNORMAL LOW
GFR calc non Af Amer: 9 mL/min — ABNORMAL LOW
Glucose, Bld: 96 mg/dL (ref 65–99)
Phosphorus: 5.2 mg/dL — ABNORMAL HIGH (ref 2.5–4.6)
Potassium: 3.9 mmol/L (ref 3.5–5.1)
Sodium: 135 mmol/L (ref 135–145)

## 2016-02-02 LAB — CBC
HCT: 21 % — ABNORMAL LOW (ref 36.0–46.0)
HCT: 20.9 % — ABNORMAL LOW (ref 36.0–46.0)
HCT: 21.8 % — ABNORMAL LOW (ref 36.0–46.0)
HEMOGLOBIN: 7.4 g/dL — AB (ref 12.0–15.0)
Hemoglobin: 7.3 g/dL — ABNORMAL LOW (ref 12.0–15.0)
Hemoglobin: 7.2 g/dL — ABNORMAL LOW (ref 12.0–15.0)
MCH: 30.9 pg (ref 26.0–34.0)
MCH: 30.6 pg (ref 26.0–34.0)
MCH: 30.2 pg (ref 26.0–34.0)
MCHC: 34.8 g/dL (ref 30.0–36.0)
MCHC: 34.4 g/dL (ref 30.0–36.0)
MCHC: 33.9 g/dL (ref 30.0–36.0)
MCV: 89 fL (ref 78.0–100.0)
MCV: 88.9 fL (ref 78.0–100.0)
MCV: 89 fL (ref 78.0–100.0)
Platelets: 178 K/uL (ref 150–400)
Platelets: 186 K/uL (ref 150–400)
Platelets: 244 10*3/uL (ref 150–400)
RBC: 2.36 MIL/uL — ABNORMAL LOW (ref 3.87–5.11)
RBC: 2.35 MIL/uL — ABNORMAL LOW (ref 3.87–5.11)
RBC: 2.45 MIL/uL — AB (ref 3.87–5.11)
RDW: 14.6 % (ref 11.5–15.5)
RDW: 14.3 % (ref 11.5–15.5)
RDW: 14.4 % (ref 11.5–15.5)
WBC: 9.6 K/uL (ref 4.0–10.5)
WBC: 9.7 K/uL (ref 4.0–10.5)
WBC: 11.5 10*3/uL — ABNORMAL HIGH (ref 4.0–10.5)

## 2016-02-02 LAB — HEPATIC FUNCTION PANEL
ALT: 127 U/L — ABNORMAL HIGH (ref 14–54)
AST: 57 U/L — ABNORMAL HIGH (ref 15–41)
Albumin: 2.1 g/dL — ABNORMAL LOW (ref 3.5–5.0)
Alkaline Phosphatase: 45 U/L (ref 38–126)
BILIRUBIN INDIRECT: 1 mg/dL — AB (ref 0.3–0.9)
Bilirubin, Direct: 0.1 mg/dL (ref 0.1–0.5)
TOTAL PROTEIN: 3.9 g/dL — AB (ref 6.5–8.1)
Total Bilirubin: 1.1 mg/dL (ref 0.3–1.2)

## 2016-02-02 MED ORDER — FUROSEMIDE 10 MG/ML IJ SOLN
80.0000 mg | Freq: Once | INTRAMUSCULAR | Status: AC
  Administered 2016-02-02: 80 mg via INTRAVENOUS
  Filled 2016-02-02: qty 8

## 2016-02-02 NOTE — Progress Notes (Signed)
Review abdominal CT results with radiology. Patient with 5.8 cm x 5.0 cm rounded fluid attenuation just superior to her uterus. Radiologist does not believe this would cause such a significant drop in her hemoglobin (3 points in 4 days). Possibly related to her ovaries (ovarian cyst with bleeding) but difficult to visualize on CT. This is an abnormal region to have bleeding and it is not clear at this point the cause. Recommends follow up imaging in 1-2 months to observe for resolution. Nothing to do acutely.   Discussed case with Dr. Heide SparkNarendra.  Will repeat CBC tonight. Transfuse Hgb < 7.0. Checking FOBT, if positive will consult GI.

## 2016-02-02 NOTE — Progress Notes (Signed)
Patient ID: Alejandra Park, female   DOB: [birth year], 30 y.o.   MRN: [id number] S:No new complaints O:BP 122/67 (BP Location: Right Arm)   Pulse 89   Temp 98.3 F (36.8 C)   Resp 18   Ht 5\' 6"  (1.676 m)   Wt 83.6 kg (184 lb 4.9 oz)   LMP 01/13/2016   SpO2 99%   BMI 29.75 kg/m   Intake/Output Summary (Last 24 hours) at 02/02/16 0938 Last data filed at 02/02/16 0846  Gross per 24 hour  Intake          3248.33 ml  Output             2250 ml  Net           998.33 ml   Intake/Output: I/O last 3 completed shifts: In: 4570 [P.O.:960; I.V.:3610] Out: 2200 [Urine:200; Other:2000]  Intake/Output this shift:  Total I/O In: 360 [P.O.:360] Out: 150 [Urine:150] Weight change:  Gen:NAD CVS:no rub Resp:cta WGN:FAOZHYAbd:benign Ext: +edema   Recent Labs Lab 01/27/16 0811 01/28/16 0733 01/29/16 0522 01/29/16 0721 01/30/16 0306 01/31/16 0527 02/01/16 0552 02/02/16 0252  NA 136 136 131*  --  132* 133* 133* 135  K 3.6 3.8 4.5  --  3.6 4.1 4.6 3.9  CL 102 102 100*  --  98* 100* 102 100*  CO2 24 24 21*  --  26 21* 21* 25  GLUCOSE 115* 95 93  --  86 83 86 96  BUN 37* 34* 53*  --  35* 57* 70* 43*  CREATININE 5.77* 5.81* 7.08*  --  5.53* 6.81* 7.99* 5.73*  ALBUMIN 2.1* 2.0* 1.9* 2.0* 2.1*  --  2.1* 2.0*  CALCIUM 7.8* 8.0* 8.1*  --  7.8* 7.9* 8.1* 7.7*  PHOS  --   --  4.6  --   --   --   --  5.2*  AST 523* 356*  --  278* 199*  --  87*  --   ALT 391* 335*  --  291* 244*  --  161*  --    Liver Function Tests:  Recent Labs Lab 01/29/16 0721 01/30/16 0306 02/01/16 0552 02/02/16 0252  AST 278* 199* 87*  --   ALT 291* 244* 161*  --   ALKPHOS 45 40 33*  --   BILITOT 1.9* 1.2 1.5*  --   PROT 4.3* 4.4* 4.0*  --   ALBUMIN 2.0* 2.1* 2.1* 2.0*   No results for input(s): LIPASE, AMYLASE in the last 168 hours. No results for input(s): AMMONIA in the last 168 hours. CBC:  Recent Labs Lab 01/28/16 0733 01/29/16 0522 01/30/16 0306 02/01/16 0552 02/02/16 0252  WBC 14.2* 14.5* 11.7*  11.6* 9.6  HGB 10.3* 9.7* 9.5* 8.3* 7.3*  HCT 30.0* 28.3* 28.0* 24.6* 21.0*  MCV 87.7 89.3 88.1 88.8 89.0  PLT 109* 130* 138* 190 178   Cardiac Enzymes:  Recent Labs Lab 01/27/16 0551 01/28/16 0733 01/29/16 0522 01/30/16 0306 02/01/16 0552  CKTOTAL 18,807* 10,359* 7,180* 4,541* 1,643*   CBG: No results for input(s): GLUCAP in the last 168 hours.  Iron Studies: No results for input(s): IRON, TIBC, TRANSFERRIN, FERRITIN in the last 72 hours. Studies/Results: Mr Frmur Right Wo Contrast  Result Date: 02/01/2016 CLINICAL DATA:  Fall after heroin use, reduced sensation in the legs and arms, painful and swollen right knee, pain in the right thigh region. EXAM: MRI OF THE RIGHT FEMUR WITHOUT CONTRAST TECHNIQUE: Multiplanar, multisequence MR imaging of the right femur was performed.  No intravenous contrast was administered. COMPARISON:  Radiographs from 01/27/2016 FINDINGS: Field heterogeneity obscures parts of the pelvis and upper thighs. Bones/Joint/Cartilage No femoral fracture or compelling findings of femoral osteomyelitis on either side. The Muscles and Tendons On the right side there is diffuse abnormal edema signal in the rectus femoris, vastus lateralis, vastus medialis, and vastus intermedius muscles. A very low-level edema signal in the short head of the biceps femoris. On the left side there is abnormal edema signal in the gluteus maximus, left hip adductor musculature, and vastus lateralis muscle. Soft tissues Diffuse subcutaneous edema in both thighs especially proximally and anteriorly. IMPRESSION: 1. Prominent abnormal edema in the anterior compartment of the right thigh involving the quadriceps musculature, along with low-level edema signal in the short head of the biceps femoris. Appearance compatible with myositis and infection is not excluded but I do not see an abscess. Right anterior compartment syndrome not excluded by MRI, consider compartmental manometry or other clinical  assessment for compartment syndrome. 2. Lesser but still abnormal degree of abnormal muscular edema in the left gluteus maximus, left hip adductor musculature, and left vastus lateralis. 3. Considerable subcutaneous edema in the thighs, especially anteriorly, cellulitis is not excluded. 4. The pelvis and hips are obscured by boundary artifact. 5. Knee MRI reported separately. These results will be called to the ordering clinician or representative by the Radiologist Assistant, and communication documented in the PACS or zVision Dashboard. Electronically Signed   By: Gaylyn Rong M.D.   On: 02/01/2016 10:02   Mr Knee Right Wo Contrast  Result Date: 02/01/2016 CLINICAL DATA:  Substance abuse, fall, knee effusion and trauma. EXAM: MRI OF THE RIGHT KNEE WITHOUT CONTRAST TECHNIQUE: Multiplanar, multisequence MR imaging of the knee was performed. No intravenous contrast was administered. COMPARISON:  None. FINDINGS: MENISCI Medial meniscus:  Unremarkable Lateral meniscus:  Unremarkable LIGAMENTS Cruciates:  Unremarkable Collaterals:  Unremarkable CARTILAGE Patellofemoral:  Minimal patellar chondromalacia. Medial:  Unremarkable Lateral:  Unremarkable Joint:  Moderate knee effusion Popliteal Fossa:  Infiltrative edema in the popliteal fossa. Extensor Mechanism: Lax patellar tendon, without discontinuity. Indistinct femoral attachment of the medial patellofemoral ligament, possibly partially torn. There is some residual edema in the distal vastus musculature as discussed on the MRI femur report. Bones: No findings of osteomyelitis or fracture. No acute bony injury. Other: Extensive subcutaneous edema around the knee, extending down into the calf. IMPRESSION: 1. Moderate knee effusion with extensive subcutaneous edema around the knee, extending down into the calf. 2. Distal quadriceps muscular edema. Indistinct femoral attachment of the medial patellofemoral ligament which may be partially torn. 3. No acute bony  findings. 4. Minimal patellar chondromalacia. 5. Infiltrative edema in the popliteal fossa. Electronically Signed   By: Gaylyn Rong M.D.   On: 02/01/2016 10:12   . diclofenac sodium  2 g Topical QID  . heparin  5,000 Units Subcutaneous Q8H  . ramelteon  8 mg Oral QHS  . sodium chloride flush  3 mL Intravenous Q12H    BMET    Component Value Date/Time   NA 135 02/02/2016 0252   K 3.9 02/02/2016 0252   CL 100 (L) 02/02/2016 0252   CO2 25 02/02/2016 0252   GLUCOSE 96 02/02/2016 0252   BUN 43 (H) 02/02/2016 0252   CREATININE 5.73 (H) 02/02/2016 0252   CALCIUM 7.7 (L) 02/02/2016 0252   GFRNONAA 9 (L) 02/02/2016 0252   GFRAA 10 (L) 02/02/2016 0252   CBC    Component Value Date/Time   WBC 9.6 02/02/2016  0252   RBC 2.36 (L) 02/02/2016 0252   HGB 7.3 (L) 02/02/2016 0252   HCT 21.0 (L) 02/02/2016 0252   PLT 178 02/02/2016 0252   MCV 89.0 02/02/2016 0252   MCH 30.9 02/02/2016 0252   MCHC 34.8 02/02/2016 0252   RDW 14.6 02/02/2016 0252   LYMPHSABS 0.9 01/23/2016 0451   MONOABS 1.0 01/23/2016 0451   EOSABS 0.0 01/23/2016 0451   BASOSABS 0.0 01/23/2016 0451     Assessment/Plan:  1. AKI- related to severe rhabdomyolysis. Remains oliguric but starting to make some. Have started IVF's to see if this will help but will continue with IHD for now.  1. No significant response to dose of IV lasix 2. S/p HD 02/01/16 late in the evening 3. Will need tunneled HD catheter on Monday or Tuesday if she does not show any signs of having renal recovery.   4. She will also likely require AVF/AVG 5. continue to monitor UOP. 6. Will d/c temp HD catheter today as it is tender and has been in place since 01/25/16.   7. Will need tunneled HD catheter on Monday/Tuesday if no return of renal function.  2. Rhabdomyolysis- due to heroin overdose. cpk levels dropping. 3. Right thigh hematoma/edema- improving. Ortho following 4. Substance abuse- will need counseling 5. Acute hepatitis- Hep C  Ab+ viral load 7,000. Will need ID evaluation 6. Anemia-  7. Disposition- unclear if her renal failure will be reversible. Being evaluated by inpatient rehab. Will likely require tunneled HD catheter if not signfiicant renal function returns. R temp hd cath has been in place since 01/25/16  Irena CordsJoseph A. Nivin Braniff, MD [facility] [phone]

## 2016-02-02 NOTE — Progress Notes (Signed)
Subjective:  Patient still complaints of swelling. Reports has abdominal swelling that makes it difficult to sit upright as well as left buttock and thigh swelling. Does not believe the ACE bandage on her right leg has helped with the swelling. Pain is improving. Denies any shortness of breath. Reports four episodes of small volume urination. Dark yellow, no longer brown. Denies any chest pain, shortness of breath, lightheadedness/dizziness, vision changes. No melena or hematochezia. Has not noted any hematuria with her small UOP.  Objective:  Vital signs in last 24 hours: Vitals:   02/01/16 2116 02/01/16 2147 02/02/16 0549 02/02/16 0926  BP: 133/82 121/73 125/66 122/67  Pulse: 90 98 100 89  Resp: 16 18 18 18   Temp: 98.4 F (36.9 C) 98.3 F (36.8 C) 98.7 F (37.1 C) 98.3 F (36.8 C)  TempSrc: Oral Oral Oral   SpO2: 96% 99% 99% 99%  Weight:      Height:       Physical Exam Constitutional: NAD Cardiovascular:RRR, no murmurs, rubs, or gallops.  Pulmonary/Chest:CTAB, no wheezes, rales, or rhonchi. R subclavian HD cath dressing in place, clean and dry  Abdominal:Soft, non tender, non distended. +BS. Ecchymosis over RLQ.  Extremities: Right thigh with diffuse swelling continues to improve, lower extremity sensation andmotor function intact. Warm and well perfused, distal pulses 2+ bilaterally. Right knee is swollen and enlarged - wrapped with ACE bandage. Appears improved from prior. ROM improved today. Neurological:A&Ox3, CN II - XII grossly intact.  Skin: No rashes or erythema  Psychiatric:Normal mood and affect  Assessment/Plan:  AKI Secondary to Rhabdomyolysis: CK has resolved. Still on NS 100 mL/hr. Remains oliguric with 175 mL UOP documented yesterday. Had dialysis late last night. Complaints of irritation at her HD catheter site. Plan was to remove her catheter after dialysis session yesterday with plans on tunneled HD catheter if no improvement tomorrow. Unfortunately,  was unable to go to dialysis until 10 PM last night and no one was available to remove catheter after her session. No signs of infection. Will need HD tunnel catheter placed for long term dialysis if renal function does not return.  -- Nephrology following, appreciate recs -- Remove temp HD cath today -- IVFs and lasix per nephrology recommendations  -- Percocet q6hrs prn for pain  -- Daily labs  Anemia: Hgb trending down. Hgb 10.3> 9.7 > 9.5 > 8.3 > 7.3 today. Re-checked CBC this morning and Hgb is 7.2. She denies any symptoms today. Denies any overt bleeding. No melena, hematochezia, hematuria. Unclear why her Hgb has dropped 3 points in 4 days. There was some concern for compartment syndrome in her right thigh, however multiple imaging studies (US, XR, MRI) have not shown any significant bleeding. Biggest concern would be for retroperitoneal bleeding at this point. Vital signs are all stable. Plts stable.  -- CT abdomen/pelvis -- FOBT -- Consider GI consult if no source identified -- Transfuse Hgb < 7.0 -- Holding heparin   Recurrent Right Knee Effusion: S/p diagnostic arthrocentesis on 12/6, ~35 cc of synovial fluid removed. Fluid was clear and negative for crystals. Cultures NGTD. Pain and swelling initially improved, unfortunately fluid reaccumulated and required a second arthrocentesis yesterday with another 40 cc removed. Better today but a moderate effusion persists. MRI yesterday showed effusion and possible tear of of medial patellofemoral ligament. Discussed with ortho, no need for surgery at this time. Outpatient elective surgery if pain persists.   -- Cultures NGTD -- Continue to monitor  -- Wrap with ACE bandage, elevate  R Thigh Pain and Swelling:Improving, no signs of compartment syndrome. Ortho was consulted and there is low suspicion for compartment syndrome at this time. Now weight bearing with PT and tolerating well.  -- Ortho consult, appreciate recommendations -- Ice,  ACE compression wrap, and elevation for swelling -- Continue to monitor -- PT/OT  Acute Hepatitis: In the setting of IV drug use and rhabdomyolysis. AST and ALT down1900 -> 87 and 705 -->161 respectively. Hep C ab positive, viral load elevated at 7,000.Likely multifactorial due to viral hepatitis and shock liver after possible transient drop in BP while using heroin. HIV negative.  -- Avoid hepatotoxic meds -- Outpatient ID referral   Left Facial Swelling: Improved.Facial CT with enlarged masseter muscle and soft tissue swelling, likely posttraumatic from her fall. No osseous abnormalities.  -- Continue to monitor   Opioid use disorder:  -- HIV negative -- May consider Suboxone as outpatient  FEN: IVF and lasix per nephro, monitoring electrolytes, regular diet VTE ppx: SCDs Code Status: FULL   Dispo: Anticipated discharge pending further HD and hopeful return of renal function.  Valentino NoseNathan Ronnita Paz, MD 02/02/2016, 10:22 AM Pager: [phone]

## 2016-02-03 DIAGNOSIS — K661 Hemoperitoneum: Secondary | ICD-10-CM

## 2016-02-03 DIAGNOSIS — M6282 Rhabdomyolysis: Secondary | ICD-10-CM

## 2016-02-03 DIAGNOSIS — N179 Acute kidney failure, unspecified: Secondary | ICD-10-CM

## 2016-02-03 LAB — CBC
HCT: 20.4 % — ABNORMAL LOW (ref 36.0–46.0)
HEMATOCRIT: 20.5 % — AB (ref 36.0–46.0)
HEMOGLOBIN: 7 g/dL — AB (ref 12.0–15.0)
HEMOGLOBIN: 7 g/dL — AB (ref 12.0–15.0)
MCH: 30.7 pg (ref 26.0–34.0)
MCH: 31 pg (ref 26.0–34.0)
MCHC: 34.1 g/dL (ref 30.0–36.0)
MCHC: 34.3 g/dL (ref 30.0–36.0)
MCV: 89.9 fL (ref 78.0–100.0)
MCV: 90.3 fL (ref 78.0–100.0)
PLATELETS: 237 10*3/uL (ref 150–400)
Platelets: 232 10*3/uL (ref 150–400)
RBC: 2.28 MIL/uL — ABNORMAL LOW (ref 3.87–5.11)
RBC: 2.26 MIL/uL — ABNORMAL LOW (ref 3.87–5.11)
RDW: 14.9 % (ref 11.5–15.5)
RDW: 14.7 % (ref 11.5–15.5)
WBC: 10.1 10*3/uL (ref 4.0–10.5)
WBC: 11 10*3/uL — ABNORMAL HIGH (ref 4.0–10.5)

## 2016-02-03 LAB — RENAL FUNCTION PANEL
ALBUMIN: 2.1 g/dL — AB (ref 3.5–5.0)
ANION GAP: 11 (ref 5–15)
BUN: 54 mg/dL — ABNORMAL HIGH (ref 6–20)
CALCIUM: 8.2 mg/dL — AB (ref 8.9–10.3)
CO2: 21 mmol/L — ABNORMAL LOW (ref 22–32)
Chloride: 102 mmol/L (ref 101–111)
Creatinine, Ser: 7.5 mg/dL — ABNORMAL HIGH (ref 0.44–1.00)
GFR calc Af Amer: 8 mL/min — ABNORMAL LOW (ref >60)
GFR calc non Af Amer: 6 mL/min — ABNORMAL LOW (ref >60)
Glucose, Bld: 94 mg/dL (ref 65–99)
PHOSPHORUS: 6.3 mg/dL — AB (ref 2.5–4.6)
Potassium: 4.3 mmol/L (ref 3.5–5.1)
SODIUM: 134 mmol/L — AB (ref 135–145)

## 2016-02-03 LAB — CULTURE, BODY FLUID-BOTTLE: CULTURE: NO GROWTH

## 2016-02-03 LAB — CK: Total CK: 816 U/L — ABNORMAL HIGH (ref 38–234)

## 2016-02-03 LAB — CULTURE, BODY FLUID W GRAM STAIN -BOTTLE

## 2016-02-03 LAB — OCCULT BLOOD X 1 CARD TO LAB, STOOL: Fecal Occult Bld: NEGATIVE

## 2016-02-03 LAB — ABO/RH: ABO/RH(D): O POS

## 2016-02-03 MED ORDER — DEXTROSE 5 % IV SOLN
1.5000 g | INTRAVENOUS | Status: AC
  Administered 2016-02-04: 1.5 g via INTRAVENOUS
  Filled 2016-02-03 (×2): qty 1.5

## 2016-02-03 MED ORDER — POLYETHYLENE GLYCOL 3350 17 G PO PACK
17.0000 g | PACK | Freq: Two times a day (BID) | ORAL | Status: DC | PRN
  Administered 2016-02-03 – 2016-02-09 (×6): 17 g via ORAL
  Filled 2016-02-03 (×6): qty 1

## 2016-02-03 MED ORDER — ACETAMINOPHEN 325 MG PO TABS
650.0000 mg | ORAL_TABLET | Freq: Two times a day (BID) | ORAL | Status: DC | PRN
Start: 2016-02-03 — End: 2016-02-04
  Administered 2016-02-03 – 2016-02-04 (×3): 650 mg via ORAL
  Filled 2016-02-03 (×2): qty 2

## 2016-02-03 NOTE — Progress Notes (Signed)
Inpatient Rehabilitation  I continue to follow along for medical course and potential need for CIR.  Pt. likely to need tunneled HD catheter if renal status does not improve.  Not yet medically ready for IP rehab.    Weldon PickingSusan Azaan Leask PT Inpatient Rehab Admissions Coordinator Cell [phone] Office [phone]

## 2016-02-03 NOTE — Progress Notes (Signed)
Subjective: Patient reports swelling in her abdomen and feeling like she is pregnant. He continues to complain of painful swelling in her labia as well. But overall reports improvement in her muscle pains.   Objective:  Vital signs in last 24 hours: Vitals:   02/02/16 0926 02/02/16 1811 02/02/16 2107 02/03/16 0359  BP: 122/67 132/70 123/71 119/60  Pulse: 89 90 87 82  Resp: 18 18 17 17   Temp: 98.3 F (36.8 C) 98.4 F (36.9 C) 98.7 F (37.1 C) 97.6 F (36.4 C)  TempSrc:  Oral Oral Oral  SpO2: 99% 99% 99%   Weight:   198 lb (89.8 kg)   Height:       Physical Exam Constitutional: NAD Cardiovascular:RRR, no murmurs, rubs, or gallops.  Pulmonary/Chest:CTAB, no wheezes, rales, or rhonchi. R subclavian HD cath dressing in place, clean and dry  Abdominal:Soft, non tender, non distended. +BS. Ecchymosis over RLQ.  Extremities: Right thigh with diffuse swelling continues to improve, lower extremity sensation andmotor function intact. Warm and well perfused, distal pulses 2+ bilaterally. Right knee is swollen and enlarged - wrapped with ACE bandage.  Neurological:A&Ox3, CN II - XII grossly intact.  Skin: No rashes or erythema  Psychiatric:Normal mood and affect   Assessment/Plan:  AKI Secondary to Rhabdomyolysis: CK has resolved. Still on NS 100 mL/hr. UOP improved today, 355 cc/ 24 hrs. Last HD session was Saturday night. Will need her temporary HD catheter removed, likely tunneled HD catheter today pending nephrology recs. Creatinine is up 7.5 from 5.7 yesterday.  -- Nephrology following, appreciate recs -- No HD today; tentative plan for tomorrow  -- [facility] -- IVFs and lasix per nephrology recommendations  -- Percocet q6hrs prn for pain  -- Daily labs  Anemia: Hgb trending down over the past 5 days. Hgb 10.3> 9.7 > 9.5 > 8.3 > 7.3 > 7.0 today. CT abdomen pelvis yesterday with a 5 x 5.8 cm hematoma in the peritoneal cavity superior to  her uterus. Patient is complaining of swelling and fullness in her abdomen, feels like she is pregnant. Dr. Karma GreaserBoswell discussed findings with radiology yesterday who did not feel this would cause a significant drop in her hemoglobin. Possibly secondary to her trauma. However there is no other clear source of bleeding. She has extensive soft tissue swelling in her right thigh but multiple imaging studies (US, XR, MRI) have not shown any bleed. She remains hemodynamically stable and is asymptomatic aside from abdominal fullness. -- FOBT negative -- Recheck CBC this afternoon  -- Transfuse for Hgb < 7.0 -- Holding heparin  -- Nephrology to start ESA  Recurrent Right Knee Effusion: S/p diagnostic arthrocentesis on 12/6, ~35 cc of synovial fluid removed. Fluid was clear and negative for crystals. Cultures NGTD. Pain and swelling initially improved, unfortunately fluid reaccumulated and required a second arthrocentesis on 12/8 with another 40 cc removed. Better today but a moderate effusion persists. MRI yesterday showed effusion and possible tear of of medial patellofemoral ligament. Discussed with ortho, no need for surgery at this time. Outpatient elective surgery if pain persists.   -- Cultures NGTD -- Continue to monitor  -- Wrap with ACE bandage, elevate  R Thigh Pain and Swelling:Improving, no signs of compartment syndrome. Ortho was consulted and there is low suspicion for compartment syndrome at this time. Now weight bearing with PT and tolerating well.  -- Ortho consult, appreciate recommendations -- Ice, ACE compression wrap, and elevation for swelling -- Continue to monitor --  PT/OT  Acute Hepatitis: In the setting of IV drug use and rhabdomyolysis. AST and ALT down 1900 -> 87 and 705 -->161 respectively. Hep C ab positive, viral load elevated at 7,000.Likely multifactorial due to viral hepatitis and shock liver after possible transient drop in BP while using heroin. HIV negative.  --  Avoid hepatotoxic meds -- Outpatient ID referral   Left Facial Swelling: Improved.Facial CT with enlarged masseter muscle and soft tissue swelling, likely posttraumatic from her fall. No osseous abnormalities.  -- Continue to monitor   Opioid use disorder:  -- HIV negative -- May consider Suboxone as outpatient  FEN: IVF, lasix, and HD per nephro, monitoring electrolytes, regular diet VTE ppx: SCDs Code Status: FULL   Dispo: Anticipated discharge pending further HD [facility]   Alejandra Pollarolyn Samon Dishner, MD 02/03/2016, 6:51 AM Pager: [phone]

## 2016-02-03 NOTE — Progress Notes (Signed)
Admit: 01/23/2016 LOS: 11  18F AKI 2/2 Rhabdomyolysis; heroin overdose  Subjective:  Slight inc UOP No HD cath currently GFR appears near 0, K and HCO3 ok   12/10 0701 - 12/11 0700 In: 600 [P.O.:600] Out: 355 [Urine:355]  Filed Weights   01/29/16 1609 02/01/16 1725 02/02/16 2107  Weight: 79.7 kg (175 lb 11.3 oz) 83.6 kg (184 lb 4.9 oz) 89.8 kg (198 lb)    Scheduled Meds: . diclofenac sodium  2 g Topical QID  . ramelteon  8 mg Oral QHS  . sodium chloride flush  3 mL Intravenous Q12H   Continuous Infusions: PRN Meds:.heparin, methocarbamol, oxyCODONE-acetaminophen, polyethylene glycol, promethazine, senna-docusate  Current Labs: reviewed    Physical Exam:  Blood pressure 123/68, pulse 97, temperature 98.2 F (36.8 C), temperature source Oral, resp. rate 18, height 5\' 6"  (1.676 m), weight 89.8 kg (198 lb), last menstrual period 01/13/2016, SpO2 99 %. NAD RRR CTAB No LEE. RLE Bandaged Some abd distensin  A 1. AKI 2/2 Rhabdomyolysis 2. Rhabdomyolysis 2/2 prolonged immobility 3. Heroin overdose 4. Anemia 5. HCV Infection  P 1. No HD needed today; tentative for tomorrow but also a day by day decision based on labs and UOP 2. [facility] 3. Start ESA with HD, IMTS eval of anemia 4. Daily weights, Daily Renal Panel, Strict I/Os, Avoid nephrotoxins (NSAIDs, judicious IV Contrast)     Sabra Heckyan Sanford MD 02/03/2016, 10:31 AM   Recent Labs Lab 01/29/16 0522  02/01/16 0552 02/02/16 0252 02/03/16 0812  NA 131*  < > 133* 135 134*  K 4.5  < > 4.6 3.9 4.3  CL 100*  < > 102 100* 102  CO2 21*  < > 21* 25 21*  GLUCOSE 93  < > 86 96 94  BUN 53*  < > 70* 43* 54*  CREATININE 7.08*  < > 7.99* 5.73* 7.50*  CALCIUM 8.1*  < > 8.1* 7.7* 8.2*  PHOS 4.6  --   --  5.2* 6.3*  < > = values in this interval not displayed.  Recent Labs Lab 02/02/16 0751 02/02/16 1837 02/03/16 0812  WBC 9.7 11.5* 10.1  HGB 7.2* 7.4* 7.0*  HCT 20.9* 21.8* 20.5*  MCV 88.9  89.0 89.9  PLT 186 244 232

## 2016-02-03 NOTE — Consult Note (Signed)
[facility] [place]  Patient name: Alejandra Park MRN: [id number] DOB: Sep 17, 1985 Sex: female  REASON FOR CONSULT: dialysis catheter, consult is from Dr. Antony ContrasGuilloud (internal medicine teaching service)  HPI: Alejandra Park is a 30 y.o. female, who presents for evaluation for tunneled dialysis catheter placement. The patient was admitted secondary to a fall and loss of consciousness during heroin use. She developed acute kidney injury secondary to rhabdomyolysis. She had a temporary dialysis catheter placed on 01/25/16 that was removed yesterday. She is unaware of any kidney issues prior to this admission.   Other medical issues include opioid abuse, polysubstance abuse, chronic back pain.   She also complained of leg pain and paresthesias upon admission. This has improved, but she continues to complain of leg swelling. She says her abdomen is swollen like she is pregnant. She denies any chest pain or shortness of breath.   Past Medical History:  Diagnosis Date  . Bulging disc   . Chronic back pain   . Drug abuse, opioid type   . Fracture of sternum   . History of suicidal ideation 05/2011  . Polysubstance abuse   . Umbilical hernia     Family History  Problem Relation Age of Onset  . Healthy Mother   . Healthy Father     SOCIAL HISTORY: Social History   Social History  . Marital status: Single    Spouse name: N/A  . Number of children: N/A  . Years of education: N/A   Occupational History  . Not on file.   Social History Main Topics  . Smoking status: Current Every Day Smoker  . Smokeless tobacco: Current User  . Alcohol use Yes  . Drug use:     Types: IV     Comment: Percocet and Heroin  . Sexual activity: Not on file   Other Topics Concern  . Not on file   Social History Narrative  . No narrative on file    No Known Allergies  Current Facility-Administered Medications  Medication Dose Route Frequency Provider Last Rate Last Dose   . acetaminophen (TYLENOL) tablet 650 mg  650 mg Oral BID PRN Reymundo Pollarolyn Guilloud, MD   650 mg at 02/03/16 1334  . diclofenac sodium (VOLTAREN) 1 % transdermal gel 2 g  2 g Topical QID Alexa R Burns, MD   2 g at 02/01/16 0942  . heparin injection 1,600 Units  20 Units/kg Dialysis PRN Terrial RhodesJoseph Coladonato, MD      . methocarbamol (ROBAXIN) tablet 1,500 mg  1,500 mg Oral Q6H PRN Reymundo Pollarolyn Guilloud, MD   1,500 mg at 02/03/16 1334  . oxyCODONE-acetaminophen (PERCOCET/ROXICET) 5-325 MG per tablet 1 tablet  1 tablet Oral Q6H PRN Reymundo Pollarolyn Guilloud, MD   1 tablet at 02/03/16 1335  . polyethylene glycol (MIRALAX / GLYCOLAX) packet 17 g  17 g Oral BID PRN Reymundo Pollarolyn Guilloud, MD      . promethazine (PHENERGAN) tablet 12.5 mg  12.5 mg Oral Q6H PRN Lora PaulaJennifer T Krall, MD   12.5 mg at 01/24/16 2329  . ramelteon (ROZEREM) tablet 8 mg  8 mg Oral QHS Eulah PontNina Blum, MD   8 mg at 02/02/16 2229  . sodium chloride flush (NS) 0.9 % injection 3 mL  3 mL Intravenous Q12H Lora PaulaJennifer T Krall, MD   3 mL at 02/02/16 2231    REVIEW OF SYSTEMS:  [X]  denotes positive finding, [ ]  denotes negative finding Cardiac  Comments:  Chest pain or chest pressure:  Shortness of breath upon exertion:    Short of breath when lying flat:    Irregular heart rhythm:        Vascular    Pain in calf, thigh, or hip brought on by ambulation:    Pain in feet at night that wakes you up from your sleep:     Blood clot in your veins:    Leg swelling:  x       Pulmonary    Oxygen at home:    Productive cough:     Wheezing:         Neurologic    Sudden weakness in arms or legs:     Sudden numbness in arms or legs:     Sudden onset of difficulty speaking or slurred speech:    Temporary loss of vision in one eye:     Problems with dizziness:         Gastrointestinal    Blood in stool:     Vomited blood:         Genitourinary    Burning when urinating:     Blood in urine:        Psychiatric    Major depression:  x       Hematologic      Bleeding problems:    Problems with blood clotting too easily:        Skin    Rashes or ulcers:        Constitutional    Fever or chills:      PHYSICAL EXAM: Vitals:   02/02/16 1811 02/02/16 2107 02/03/16 0359 02/03/16 0904  BP: 132/70 123/71 119/60 123/68  Pulse: 90 87 82 97  Resp: 18 17 17 18   Temp: 98.4 F (36.9 C) 98.7 F (37.1 C) 97.6 F (36.4 C) 98.2 F (36.8 C)  TempSrc: Oral Oral Oral Oral  SpO2: 99% 99%  99%  Weight:  198 lb (89.8 kg)    Height:        GENERAL: The patient is a well-nourished female, in no acute distress. The vital signs are documented above. CARDIAC: There is a regular rate and rhythm.  VASCULAR: 2+ radial and dorsalis pedis pulses bilaterally.  PULMONARY: There is good air exchange bilaterally without wheezing or rales. ABDOMEN: Soft and non-tender.   MUSCULOSKELETAL: Swelling bilateral lower extremities.  NEUROLOGIC: No focal deficits.  SKIN: There are no ulcers or rashes noted. PSYCHIATRIC: The patient has a normal affect.   MEDICAL ISSUES: AKI secondary to rhadomyolysis Heroin overdose Acute hepatitis C Anemia  Temporary catheter was removed yesterday. Possible HD tomorrow per nephrology.  Plan for permanent dialysis catheter placement tomorrow. Dr. Darrick PennaFields to see patient this afternoon.      Maris BergerKimberly Jakara Blatter, PA-C [facility] [place] [phone]

## 2016-02-04 ENCOUNTER — Inpatient Hospital Stay (HOSPITAL_COMMUNITY): Payer: Self-pay

## 2016-02-04 ENCOUNTER — Inpatient Hospital Stay (HOSPITAL_COMMUNITY): Payer: Self-pay | Admitting: Certified Registered Nurse Anesthetist

## 2016-02-04 ENCOUNTER — Encounter (HOSPITAL_COMMUNITY)
Admission: EM | Disposition: A | Discharge status: Home / Self Care | Payer: Self-pay | Source: Home / Self Care | Attending: Internal Medicine

## 2016-02-04 HISTORY — PX: INSERTION OF DIALYSIS CATHETER: SHX1324

## 2016-02-04 LAB — CBC
HCT: 21.9 % — ABNORMAL LOW (ref 36.0–46.0)
HEMATOCRIT: 19.6 % — AB (ref 36.0–46.0)
HEMOGLOBIN: 6.6 g/dL — AB (ref 12.0–15.0)
HEMOGLOBIN: 7.4 g/dL — AB (ref 12.0–15.0)
MCH: 31 pg (ref 26.0–34.0)
MCH: 30.1 pg (ref 26.0–34.0)
MCHC: 33.7 g/dL (ref 30.0–36.0)
MCHC: 33.8 g/dL (ref 30.0–36.0)
MCV: 92 fL (ref 78.0–100.0)
MCV: 89 fL (ref 78.0–100.0)
PLATELETS: 278 10*3/uL (ref 150–400)
Platelets: 232 10*3/uL (ref 150–400)
RBC: 2.13 MIL/uL — AB (ref 3.87–5.11)
RBC: 2.46 MIL/uL — ABNORMAL LOW (ref 3.87–5.11)
RDW: 14.9 % (ref 11.5–15.5)
RDW: 15.2 % (ref 11.5–15.5)
WBC: 9.6 10*3/uL (ref 4.0–10.5)
WBC: 8.1 10*3/uL (ref 4.0–10.5)

## 2016-02-04 LAB — PREPARE RBC (CROSSMATCH)

## 2016-02-04 LAB — RENAL FUNCTION PANEL
ALBUMIN: 2.3 g/dL — AB (ref 3.5–5.0)
ANION GAP: 14 (ref 5–15)
BUN: 64 mg/dL — ABNORMAL HIGH (ref 6–20)
CO2: 18 mmol/L — AB (ref 22–32)
Calcium: 8.2 mg/dL — ABNORMAL LOW (ref 8.9–10.3)
Chloride: 101 mmol/L (ref 101–111)
Creatinine, Ser: 8.76 mg/dL — ABNORMAL HIGH (ref 0.44–1.00)
GFR calc Af Amer: 6 mL/min — ABNORMAL LOW (ref >60)
GFR calc non Af Amer: 5 mL/min — ABNORMAL LOW (ref >60)
GLUCOSE: 87 mg/dL (ref 65–99)
PHOSPHORUS: 6.9 mg/dL — AB (ref 2.5–4.6)
POTASSIUM: 4.7 mmol/L (ref 3.5–5.1)
Sodium: 133 mmol/L — ABNORMAL LOW (ref 135–145)

## 2016-02-04 LAB — HEPATIC FUNCTION PANEL
ALT: 93 U/L — ABNORMAL HIGH (ref 14–54)
AST: 34 U/L (ref 15–41)
Albumin: 2.2 g/dL — ABNORMAL LOW (ref 3.5–5.0)
Alkaline Phosphatase: 35 U/L — ABNORMAL LOW (ref 38–126)
Bilirubin, Direct: <0.1 mg/dL — ABNORMAL LOW (ref 0.1–0.5)
Total Bilirubin: 2.1 mg/dL — ABNORMAL HIGH (ref 0.3–1.2)
Total Protein: 4.7 g/dL — ABNORMAL LOW (ref 6.5–8.1)

## 2016-02-04 SURGERY — INSERTION OF DIALYSIS CATHETER
Anesthesia: General | Site: Neck

## 2016-02-04 MED ORDER — ONDANSETRON HCL 4 MG/2ML IJ SOLN
INTRAMUSCULAR | Status: DC | PRN
  Administered 2016-02-04: 4 mg via INTRAVENOUS

## 2016-02-04 MED ORDER — PROMETHAZINE HCL 25 MG/ML IJ SOLN: 6.2500 mg | INTRAMUSCULAR | Status: DC | PRN

## 2016-02-04 MED ORDER — SODIUM CHLORIDE 0.9 % IV SOLN
Freq: Once | INTRAVENOUS | Status: AC
  Administered 2016-02-04: 11:00:00 via INTRAVENOUS

## 2016-02-04 MED ORDER — HYDROMORPHONE HCL 1 MG/ML IJ SOLN
INTRAMUSCULAR | Status: AC
  Filled 2016-02-04: qty 0.5

## 2016-02-04 MED ORDER — HEPARIN SODIUM (PORCINE) 1000 UNIT/ML IJ SOLN
INTRAMUSCULAR | Status: DC | PRN
  Administered 2016-02-04: 3 mL via INTRAVENOUS

## 2016-02-04 MED ORDER — ACETAMINOPHEN 325 MG PO TABS
ORAL_TABLET | ORAL | Status: AC
  Administered 2016-02-04: 650 mg via ORAL
  Filled 2016-02-04: qty 2

## 2016-02-04 MED ORDER — FENTANYL CITRATE (PF) 250 MCG/5ML IJ SOLN
INTRAMUSCULAR | Status: AC
  Filled 2016-02-04: qty 5

## 2016-02-04 MED ORDER — PROPOFOL 10 MG/ML IV BOLUS
INTRAVENOUS | Status: AC
  Filled 2016-02-04: qty 40

## 2016-02-04 MED ORDER — DARBEPOETIN ALFA 100 MCG/0.5ML IJ SOSY
100.0000 ug | PREFILLED_SYRINGE | INTRAMUSCULAR | Status: DC
  Administered 2016-02-04: 100 ug via INTRAVENOUS
  Filled 2016-02-04: qty 0.5

## 2016-02-04 MED ORDER — HEPARIN SODIUM (PORCINE) 1000 UNIT/ML IJ SOLN
INTRAMUSCULAR | Status: AC
  Filled 2016-02-04: qty 1

## 2016-02-04 MED ORDER — LIDOCAINE HCL (CARDIAC) 20 MG/ML IV SOLN
INTRAVENOUS | Status: DC | PRN
  Administered 2016-02-04: 100 mg via INTRAVENOUS

## 2016-02-04 MED ORDER — 0.9 % SODIUM CHLORIDE (POUR BTL) OPTIME
TOPICAL | Status: DC | PRN
  Administered 2016-02-04: 1000 mL

## 2016-02-04 MED ORDER — LIDOCAINE HCL (PF) 1 % IJ SOLN
INTRAMUSCULAR | Status: AC
  Filled 2016-02-04: qty 30

## 2016-02-04 MED ORDER — MIDAZOLAM HCL 5 MG/5ML IJ SOLN
INTRAMUSCULAR | Status: DC | PRN
  Administered 2016-02-04: 2 mg via INTRAVENOUS

## 2016-02-04 MED ORDER — SODIUM CHLORIDE 0.9 % IV SOLN
INTRAVENOUS | Status: DC | PRN
  Administered 2016-02-04: 12:00:00 500 mL

## 2016-02-04 MED ORDER — FENTANYL CITRATE (PF) 100 MCG/2ML IJ SOLN
INTRAMUSCULAR | Status: DC | PRN
  Administered 2016-02-04 (×5): 50 ug via INTRAVENOUS

## 2016-02-04 MED ORDER — MEPERIDINE HCL 25 MG/ML IJ SOLN: 6.2500 mg | INTRAMUSCULAR | Status: DC | PRN

## 2016-02-04 MED ORDER — ACETAMINOPHEN 500 MG PO TABS
1000.0000 mg | ORAL_TABLET | Freq: Two times a day (BID) | ORAL | Status: DC
  Administered 2016-02-05 – 2016-02-09 (×9): 1000 mg via ORAL
  Filled 2016-02-04 (×11): qty 2

## 2016-02-04 MED ORDER — SODIUM CHLORIDE 0.9 % IV SOLN
Freq: Once | INTRAVENOUS | Status: AC
  Administered 2016-02-04: 12:00:00 via INTRAVENOUS

## 2016-02-04 MED ORDER — PROPOFOL 10 MG/ML IV BOLUS
INTRAVENOUS | Status: DC | PRN
  Administered 2016-02-04: 200 mg via INTRAVENOUS

## 2016-02-04 MED ORDER — OXYCODONE-ACETAMINOPHEN 5-325 MG PO TABS
ORAL_TABLET | ORAL | Status: AC
  Administered 2016-02-04: 1 via ORAL
  Filled 2016-02-04: qty 1

## 2016-02-04 MED ORDER — DARBEPOETIN ALFA 100 MCG/0.5ML IJ SOSY
PREFILLED_SYRINGE | INTRAMUSCULAR | Status: AC
  Administered 2016-02-04: 100 ug via INTRAVENOUS
  Filled 2016-02-04: qty 0.5

## 2016-02-04 MED ORDER — HYDROMORPHONE HCL 1 MG/ML IJ SOLN
0.2500 mg | INTRAMUSCULAR | Status: DC | PRN
  Administered 2016-02-04 (×2): 0.5 mg via INTRAVENOUS

## 2016-02-04 MED ORDER — LIDOCAINE 2% (20 MG/ML) 5 ML SYRINGE
INTRAMUSCULAR | Status: AC
  Filled 2016-02-04: qty 5

## 2016-02-04 MED ORDER — MIDAZOLAM HCL 2 MG/2ML IJ SOLN
INTRAMUSCULAR | Status: AC
  Filled 2016-02-04: qty 2

## 2016-02-04 SURGICAL SUPPLY — 44 items
ADH SKN CLS LQ APL DERMABOND (GAUZE/BANDAGES/DRESSINGS) ×1
BAG DECANTER FOR FLEXI CONT (MISCELLANEOUS) ×3 IMPLANT
BIOPATCH RED 1 DISK 7.0 (GAUZE/BANDAGES/DRESSINGS) ×2 IMPLANT
BIOPATCH RED 1IN DISK 7.0MM (GAUZE/BANDAGES/DRESSINGS) ×1
CATH PALINDROME RT-P 15FX19CM (CATHETERS) ×2 IMPLANT
CATH PALINDROME RT-P 15FX23CM (CATHETERS) IMPLANT
CATH PALINDROME RT-P 15FX28CM (CATHETERS) IMPLANT
CATH PALINDROME RT-P 15FX55CM (CATHETERS) IMPLANT
CHLORAPREP W/TINT 26ML (MISCELLANEOUS) ×1 IMPLANT
COVER PROBE W GEL 5X96 (DRAPES) ×2 IMPLANT
COVER SURGICAL LIGHT HANDLE (MISCELLANEOUS) ×3 IMPLANT
DERMABOND ADHESIVE PROPEN (GAUZE/BANDAGES/DRESSINGS) ×2
DERMABOND ADVANCED .7 DNX6 (GAUZE/BANDAGES/DRESSINGS) IMPLANT
DRAPE C-ARM 42X72 X-RAY (DRAPES) ×1 IMPLANT
DRAPE CHEST BREAST 15X10 FENES (DRAPES) ×3 IMPLANT
GAUZE SPONGE 2X2 8PLY STRL LF (GAUZE/BANDAGES/DRESSINGS) IMPLANT
GAUZE SPONGE 4X4 16PLY XRAY LF (GAUZE/BANDAGES/DRESSINGS) ×3 IMPLANT
GLOVE BIO SURGEON STRL SZ7.5 (GLOVE) ×3 IMPLANT
GLOVE BIOGEL PI IND STRL 8 (GLOVE) ×1 IMPLANT
GLOVE BIOGEL PI INDICATOR 8 (GLOVE)
GOWN STRL REUS W/ TWL LRG LVL3 (GOWN DISPOSABLE) ×2 IMPLANT
GOWN STRL REUS W/TWL LRG LVL3 (GOWN DISPOSABLE) ×6
KIT BASIN OR (CUSTOM PROCEDURE TRAY) ×3 IMPLANT
KIT ROOM TURNOVER OR (KITS) ×3 IMPLANT
NDL 18GX1X1/2 (RX/OR ONLY) (NEEDLE) ×1 IMPLANT
NDL HYPO 25GX1X1/2 BEV (NEEDLE) ×1 IMPLANT
NEEDLE 18GX1X1/2 (RX/OR ONLY) (NEEDLE) ×3 IMPLANT
NEEDLE 22X1 1/2 (OR ONLY) (NEEDLE) ×3 IMPLANT
NEEDLE HYPO 25GX1X1/2 BEV (NEEDLE) ×3 IMPLANT
NS IRRIG 1000ML POUR BTL (IV SOLUTION) ×3 IMPLANT
PACK SURGICAL SETUP 50X90 (CUSTOM PROCEDURE TRAY) ×3 IMPLANT
PAD ARMBOARD 7.5X6 YLW CONV (MISCELLANEOUS) ×6 IMPLANT
SOAP 2 % CHG 4 OZ (WOUND CARE) ×2 IMPLANT
SPONGE GAUZE 2X2 STER 10/PKG (GAUZE/BANDAGES/DRESSINGS)
SUT ETHILON 3 0 PS 1 (SUTURE) ×3 IMPLANT
SUT MNCRL AB 4-0 PS2 18 (SUTURE) ×2 IMPLANT
SUT VICRYL 4-0 PS2 18IN ABS (SUTURE) ×1 IMPLANT
SYR 20CC LL (SYRINGE) ×4 IMPLANT
SYR 5ML LL (SYRINGE) ×4 IMPLANT
SYR CONTROL 10ML LL (SYRINGE) ×3 IMPLANT
SYRINGE 10CC LL (SYRINGE) ×3 IMPLANT
TOWEL OR 17X24 6PK STRL BLUE (TOWEL DISPOSABLE) ×3 IMPLANT
TOWEL OR 17X26 10 PK STRL BLUE (TOWEL DISPOSABLE) ×3 IMPLANT
WATER STERILE IRR 1000ML POUR (IV SOLUTION) ×1 IMPLANT

## 2016-02-04 NOTE — Progress Notes (Signed)
  Progress Note    02/04/2016 11:27 AM Day of Surgery  Subjective:  Feeling ok  Vitals:   02/04/16 0835 02/04/16 0905  BP: 116/65 125/74  Pulse: 83 87  Resp: 16 18  Temp: 98.4 F (36.9 C) 98.5 F (36.9 C)    GENERAL: The patient is a well-nourished female, in no acute distress. The vital signs are documented above. CARDIAC: There is a regular rate and rhythm.  VASCULAR: 2+ radial and dorsalis pedis pulses bilaterally.  PULMONARY: There is good air exchange bilaterally without wheezing or rales. ABDOMEN: Soft and non-tender.   MUSCULOSKELETAL: Swelling bilateral lower extremities.  NEUROLOGIC: No focal deficits.  SKIN: There are no ulcers or rashes noted. PSYCHIATRIC: The patient has a normal affect.  CBC    Component Value Date/Time   WBC 9.6 02/04/2016 0554   RBC 2.13 (L) 02/04/2016 0554   HGB 6.6 (LL) 02/04/2016 0554   HCT 19.6 (L) 02/04/2016 0554   PLT 232 02/04/2016 0554   MCV 92.0 02/04/2016 0554   MCH 31.0 02/04/2016 0554   MCHC 33.7 02/04/2016 0554   RDW 14.9 02/04/2016 0554   LYMPHSABS 0.9 01/23/2016 0451   MONOABS 1.0 01/23/2016 0451   EOSABS 0.0 01/23/2016 0451   BASOSABS 0.0 01/23/2016 0451    BMET    Component Value Date/Time   NA 133 (L) 02/04/2016 0554   K 4.7 02/04/2016 0554   CL 101 02/04/2016 0554   CO2 18 (L) 02/04/2016 0554   GLUCOSE 87 02/04/2016 0554   BUN 64 (H) 02/04/2016 0554   CREATININE 8.76 (H) 02/04/2016 0554   CALCIUM 8.2 (L) 02/04/2016 0554   GFRNONAA 5 (L) 02/04/2016 0554   GFRAA 6 (L) 02/04/2016 0554    INR    Component Value Date/Time   INR 1.16 01/23/2016 1330     Intake/Output Summary (Last 24 hours) at 02/04/16 1127 Last data filed at 02/04/16 0815  Gross per 24 hour  Intake              360 ml  Output              375 ml  Net              -15 ml     Assessment:  30 y.o. female is here with aki 2/2 rhabdo.  Plan: Tunneled dialysis catheter today   Brandon C. Randie Heinzain, MD [facility] [place] Office: [phone] Pager: [phone]  02/04/2016 11:27 AM

## 2016-02-04 NOTE — Progress Notes (Signed)
Subjective: Patient continue to complain of fullness in her abdomen and is now complaining of new lower back pain. She denies dizziness/lightheadedness, SOB, and chest pain.   Objective:  Vital signs in last 24 hours: Vitals:   02/03/16 0359 02/03/16 0904 02/03/16 2131 02/04/16 0455  BP: 119/60 123/68 119/72 117/74  Pulse: 82 97 91 100  Resp: 17 18 17 17   Temp: 97.6 F (36.4 C) 98.2 F (36.8 C) 98.7 F (37.1 C) 98.3 F (36.8 C)  TempSrc: Oral Oral Oral Oral  SpO2:  99% 100% 97%  Weight:    196 lb (88.9 kg)  Height:       Physical Exam Constitutional: NAD, appears comfortable HEENT: Atraumatic, normocephalic. PERRL, anicteric sclera.  Neck: Supple, trachea midline.  Cardiovascular: RRR, no murmurs, rubs, or gallops.  Pulmonary/Chest: CTAB, no wheezes, rales, or rhonchi. No chest wall abnormalities.  Abdominal: Soft, non tender, non distended. +BS. Ecchymosis over RLQ.  Extremities: Right thigh with diffuse swelling continues to improve, lower extremity sensation andmotor function intact. Warm and well perfused, distal pulses 2+ bilaterally. Right knee is swollen and enlarged - wrapped with ACE bandage.  Neurological: A&Ox3, CN II - XII grossly intact.  Skin: No rashes or erythema  Psychiatric: Normal mood and affect MSK: Lower back tender to palpation   Assessment/Plan:  AKI Secondary to Rhabdomyolysis: CK has resolved. Still on NS 100 mL/hr. UOP only recorded as 250 cc/ 24 hrs but after speaking with patient this seems inaccurate. Patient reports urinating 4-5 times since yesterday with at least 100 cc each time. She also reports that her urine had turned from a dark brown color to a dark yellow. RN paged me yesterday for constipation. I gave Miralax BID prn and received a page back that patient had BM. Not recorded in chart today.  Last HD session was Saturday night. Temporaty HD catheter has been removed, plan for tunneled HD catheter today per vascular then HD per  nephrology. Creatinine continues to rise, 8.7 today. -- Strict I/Os, daily weights  -- Nephrology following, appreciate recs -- Vascular consult for TCD today and possible future AVF/AVG  -- IVFs and lasix per nephrology recommendations  -- Percocet q6hrs prn for pain  -- Daily labs  Anemia: Hgb trending down over the past 6 days. Hgb 10.3>9.7 >9.5 >8.3 >7.3 > 7.0 > 6.6 today. CT abdomen pelvis yesterday with a 5 x 5.8 cm hematoma in the peritoneal cavity superior to her uterus. Patient is complaining of swelling and fullness in her abdomen, feels like she is pregnant. Dr. Karma GreaserBoswell discussed findings with radiology who did not feel this would cause a significant drop in her hemoglobin. However there is no other clear source of bleeding. She has extensive soft tissue swelling in her right thigh but multiple imaging studies (US, XR, MRI) have not shown any bleed. She remains hemodynamically stable and is asymptomatic aside from abdominal fullness and headache. Transfuse 1 unit of pRBCs today. Will check post transfusion labs.  -- Repeat CT abdomen pelvis today  -- FOBT negative -- Transfuse 1 unit pRBCs today -- F/u post transfusion CBC -- Transfuse for Hgb < 7.0 -- Holding heparin  -- ESA per nephrology   Recurrent Right Knee Effusion: S/p diagnostic arthrocentesis on 12/6, ~35 cc of synovial fluid removed. Fluid was clear and negative for crystals. Cultures NGTD. Pain and swelling initially improved, unfortunately fluid reaccumulated and required a second arthrocentesis on 12/8 with another 40 cc removed. Better today but a moderate effusion persists.  MRI showed effusion and possible tear of of medial patellofemoral ligament. Discussed with ortho, no need for surgery at this time. Outpatient elective surgery if pain persists.  -- Cultures NGTD -- Continue to monitor  -- Wrap with ACE bandage, elevate  R Thigh Pain and Swelling:Improving, no signs of compartment syndrome. Ortho was  consulted and there is low suspicion for compartment syndrome at this time. Now weight bearing with PT and tolerating well.  -- Ortho consult, appreciate recommendations -- Ice, ACE compression wrap, and elevation for swelling -- Continue to monitor -- PT/OT  Acute Hepatitis: In the setting of IV drug use and rhabdomyolysis. AST and ALT down 1900 -> 87 and 705 -->161 respectively. Hep C ab positive, viral load elevated at 7,000.Likely multifactorial due to viral hepatitis and shock liver after possible transient drop in BP while using heroin. HIV negative.  -- Avoid hepatotoxic meds -- Outpatient ID referral   Left Facial Swelling: Improved.Facial CT with enlarged masseter muscle and soft tissue swelling, likely posttraumatic from her fall. No osseous abnormalities.  -- Continue to monitor   Opioid use disorder:  -- HIV negative -- May consider Suboxone as outpatient  FEN: IVF, lasix, and HD per nephro, monitoring electrolytes, regular diet VTE ppx: SCDs Code Status: FULL   Dispo: Anticipated discharge pending TDC placement, further HD, and anemia work up.   Reymundo Pollarolyn Dania Marsan, MD 02/04/2016, 6:48 AM Pager: [phone]

## 2016-02-04 NOTE — Anesthesia Preprocedure Evaluation (Signed)
Anesthesia Evaluation  Patient identified by MRN, date of birth, ID band Patient awake    Reviewed: Allergy & Precautions, NPO status , Patient's Chart, lab work & pertinent test results  Airway Mallampati: II  TM Distance: >3 FB Neck ROM: Full    Dental no notable dental hx.    Pulmonary neg pulmonary ROS, Current Smoker,    Pulmonary exam normal breath sounds clear to auscultation       Cardiovascular negative cardio ROS Normal cardiovascular exam Rhythm:Regular Rate:Normal     Neuro/Psych negative neurological ROS  negative psych ROS   GI/Hepatic negative GI ROS, (+) Hepatitis -  Endo/Other  negative endocrine ROS  Renal/GU Renal disease  negative genitourinary   Musculoskeletal negative musculoskeletal ROS (+)   Abdominal   Peds negative pediatric ROS (+)  Hematology negative hematology ROS (+)   Anesthesia Other Findings   Reproductive/Obstetrics negative OB ROS                           Anesthesia Physical Anesthesia Plan  ASA: III  Anesthesia Plan: General   Post-op Pain Management:    Induction: Intravenous  Airway Management Planned: LMA  Additional Equipment:   Intra-op Plan:   Post-operative Plan: Extubation in OR  Informed Consent: I have reviewed the patients History and Physical, chart, labs and discussed the procedure including the risks, benefits and alternatives for the proposed anesthesia with the patient or authorized representative who has indicated his/her understanding and acceptance.   Dental advisory given  Plan Discussed with: CRNA  Anesthesia Plan Comments:         Anesthesia Quick Evaluation

## 2016-02-04 NOTE — Progress Notes (Signed)
PT Cancellation Note  Patient Details Name: Alejandra Park MRN: [id number] DOB: June 02, 1985   Cancelled Treatment:    Reason Eval/Treat Not Completed: Patient at procedure or test/unavailable.  Pt to OR for HD catheter and Hgb is at a critical level.  Will see today as able or 12/13. 02/04/2016  [place] BingKen Jazmarie Biever, PT [phone]  (pager)   Eliseo GumKenneth V Terel Bann 02/04/2016, 10:57 AM

## 2016-02-04 NOTE — Progress Notes (Signed)
Admit: 01/23/2016 LOS: 12  45F AKI 2/2 Rhabdomyolysis; heroin overdose  Subjective:  Labs worsened overnight, no uptick in UOP Hb down, [facility] then HD today  12/11 0701 - 12/12 0700 In: 840 [P.O.:840] Out: 250 [Urine:250]  Filed Weights   02/01/16 1725 02/02/16 2107 02/04/16 0455  Weight: 83.6 kg (184 lb 4.9 oz) 89.8 kg (198 lb) 88.9 kg (196 lb)    Scheduled Meds: . sodium chloride   Intravenous Once  . cefUROXime (ZINACEF)  IV  1.5 g Intravenous To SSTC  . diclofenac sodium  2 g Topical QID  . ramelteon  8 mg Oral QHS  . sodium chloride flush  3 mL Intravenous Q12H   Continuous Infusions: PRN Meds:.acetaminophen, heparin, methocarbamol, oxyCODONE-acetaminophen, polyethylene glycol, promethazine  Current Labs: reviewed    Physical Exam:  Blood pressure 125/74, pulse 87, temperature 98.5 F (36.9 C), temperature source Oral, resp. rate 18, height 5\' 6"  (1.676 m), weight 88.9 kg (196 lb), last menstrual period 01/13/2016, SpO2 98 %. NAD RRR CTAB No LEE. RLE Bandaged Some abd distensin  A 1. AKI 2/2 Rhabdomyolysis 2. Rhabdomyolysis 2/2 prolonged immobility 3. Heroin overdose 4. Anemia 5. HCV Infection  P 1. HD today following TDC placement 2. Start ESA with HD, IMTS eval of anemia -- Aranesp 100 today 3. Daily weights, Daily Renal Panel, Strict I/Os, Avoid nephrotoxins (NSAIDs, judicious IV Contrast)     Sabra Heckyan Javionna Leder MD 02/04/2016, 10:14 AM   Recent Labs Lab 02/02/16 0252 02/03/16 0812 02/04/16 0554  NA 135 134* 133*  K 3.9 4.3 4.7  CL 100* 102 101  CO2 25 21* 18*  GLUCOSE 96 94 87  BUN 43* 54* 64*  CREATININE 5.73* 7.50* 8.76*  CALCIUM 7.7* 8.2* 8.2*  PHOS 5.2* 6.3* 6.9*    Recent Labs Lab 02/03/16 0812 02/03/16 1504 02/04/16 0554  WBC 10.1 11.0* 9.6  HGB 7.0* 7.0* 6.6*  HCT 20.5* 20.4* 19.6*  MCV 89.9 90.3 92.0  PLT 232 237 232

## 2016-02-04 NOTE — Anesthesia Procedure Notes (Signed)
Procedure Name: LMA Insertion Date/Time: 02/04/2016 12:04 PM Performed by: Samara DeistBECKNER, Yael Angerer B Pre-anesthesia Checklist: Patient identified, Emergency Drugs available, Suction available, Patient being monitored and Timeout performed Patient Re-evaluated:Patient Re-evaluated prior to inductionOxygen Delivery Method: Circle system utilized Preoxygenation: Pre-oxygenation with 100% oxygen Intubation Type: IV induction Ventilation: Mask ventilation without difficulty LMA: LMA inserted LMA Size: 4.0 Number of attempts: 1 Placement Confirmation: positive ETCO2 and breath sounds checked- equal and bilateral Tube secured with: Tape Dental Injury: Teeth and Oropharynx as per pre-operative assessment

## 2016-02-04 NOTE — Anesthesia Postprocedure Evaluation (Signed)
Anesthesia Post Note  Patient: Alejandra FerronRebecca D Mcbane  Procedure(s) Performed: Procedure(s) (LRB): INSERTION OF RIGHT INTERNAL JUGULAR DIALYSIS CATHETER (N/A)  Patient location during evaluation: PACU Anesthesia Type: General Level of consciousness: sedated and patient cooperative Pain management: pain level controlled Vital Signs Assessment: post-procedure vital signs reviewed and stable Respiratory status: spontaneous breathing Cardiovascular status: stable Anesthetic complications: no    Last Vitals:  Vitals:   02/04/16 1400 02/04/16 1439  BP: 121/77 123/63  Pulse: 72 72  Resp: 14 14  Temp: 36.8 C 36.8 C    Last Pain:  Vitals:   02/04/16 1440  TempSrc:   PainSc: 8                  Lewie LoronJohn Essa Wenk

## 2016-02-04 NOTE — Op Note (Signed)
    OPERATIVE NOTE  PROCEDURE: 1. Right internal jugular vein tunneled dialysis catheter placement 2. Right internal jugular vein cannulation under ultrasound guidance  PRE-OPERATIVE DIAGNOSIS: acute kidney injury secondary rhabdomyolysis  POST-OPERATIVE DIAGNOSIS: same as above  SURGEON: Laretta Pyatt C. Randie Heinzain, MD  ANESTHESIA: general  ESTIMATED BLOOD LOSS: 30 cc  FINDING(S): 1.  Tips of the catheter in the SVC on fluoroscopy 2.  No obvious pneumothorax on fluoroscopy  SPECIMEN(S):  none  INDICATIONS:   Alejandra Park is a 30 y.o. female who presents with end stage renal disease.  The patient presents for tunneled dialysis catheter placement.  The patient is aware the risks of tunneled dialysis catheter placement include but are not limited to: bleeding, infection, central venous injury, pneumothorax, possible venous stenosis, possible malpositioning in the venous system, and possible infections related to long-term catheter presence.  The patient was aware of these risks and agreed to proceed.  DESCRIPTION: After written full informed consent was obtained from the patient, the patient was taken back to the operating room.  Prior to induction, the patient was given IV antibiotics.  After obtaining adequate sedation, the patient was prepped and draped in the standard fashion for a chest or neck tunneled dialysis catheter placement.  Under ultrasound guidance, the right internal jugular vein was cannulated with the 18 gauge needle.  A J-wire was then placed down into the right ventricle under fluoroscopic guidance.  The wire was then secured in place with a clamp to the drapes.  I then made stab incisions at the neck and exit sites.   I dissected from the exit site to the cannulation site with a tunneler.   The subcutaneous tunnel was dilated by passing a plastic dilator over the metal dissector. The wire was then unclamped and I removed the needle.  The skin tract and venotomy was dilated  serially with dilators.  Finally, the dilator-sheath was placed under fluoroscopic guidance into the superior vena cava.  The dilator and wire were removed.  A 19 cm Diatek catheter was placed under fluoroscopic guidance down into the SVC  The sheath was broken and peeled away while holding the catheter cuff at the level of the skin.  The back end of this catheter was transected, and docked onto the tunneler.  The distal catheter was delivered through the subcutaneous tunnel.  The catheter was transected a second time, revealing the two lumens of this catheter.  The ports were docked onto these two lumens.  The catheter collar was then snapped into place.  Each port was tested by aspirating and flushing.  No resistance was noted.  Each port was then thoroughly flushed with heparinized saline.  The catheter was secured in placed with 3-0 Nylon tied to the catheter.  The neck incision was closed with a U-stitch of 4-0 Monocryl.  The neck and chest incision were cleaned and sterile bandages applied.  Each port was then loaded with concentrated heparin (1000 Units/mL) at the manufacturer recommended volumes to each port.  Sterile caps were applied to each port.    On completion fluoroscopy, the tips of the catheter were in the right atrium, and there was no evidence of pneumothorax.   COMPLICATIONS: none immediate  CONDITION: stable  Alejandra Eckenrode C. Randie Heinzain, MD [facility] [place] Office: [phone] Pager: [phone]   02/04/2016, 12:32 PM

## 2016-02-04 NOTE — Progress Notes (Addendum)
CRITICAL VALUE ALERT  Critical value received:  hgb 6.6  Date of notification:  12/12 2017  Time of notification:  0723  Critical value read back:yes  Nurse who received alert:  Jacklyn ShellAnne Steele Ledonne RN MD notified (1st page):  Dr. Antony ContrasGuilloud  Time of first page:  [phone]  MD notified (2nd page):  Time of second page:  Responding MD:  Dr.Guilloud  Time MD responded:  [phone]

## 2016-02-04 NOTE — Transfer of Care (Signed)
Immediate Anesthesia Transfer of Care Note  Patient: Alejandra FerronRebecca D Park  Procedure(s) Performed: Procedure(s): INSERTION OF RIGHT INTERNAL JUGULAR DIALYSIS CATHETER (N/A)  Patient Location: PACU  Anesthesia Type:General  Level of Consciousness: awake, alert  and oriented  Airway & Oxygen Therapy: Patient Spontanous Breathing and Patient connected to nasal cannula oxygen  Post-op Assessment: Report given to RN and Post -op Vital signs reviewed and stable  Post vital signs: Reviewed and stable  Last Vitals:  Vitals:   02/04/16 0905 02/04/16 1243  BP: 125/74 117/74  Pulse: 87 91  Resp: 18 15  Temp: 36.9 C     Last Pain:  Vitals:   02/04/16 1243  TempSrc:   PainSc: 10-Worst pain ever      Patients Stated Pain Goal: 3 (02/04/16 0449)  Complications: No apparent anesthesia complications

## 2016-02-05 ENCOUNTER — Encounter (HOSPITAL_COMMUNITY): Payer: Self-pay | Admitting: Vascular Surgery

## 2016-02-05 ENCOUNTER — Inpatient Hospital Stay (HOSPITAL_COMMUNITY): Payer: Self-pay

## 2016-02-05 LAB — CBC
HCT: 22.5 % — ABNORMAL LOW (ref 36.0–46.0)
HCT: 22.9 % — ABNORMAL LOW (ref 36.0–46.0)
HEMOGLOBIN: 7.7 g/dL — AB (ref 12.0–15.0)
Hemoglobin: 7.7 g/dL — ABNORMAL LOW (ref 12.0–15.0)
MCH: 30.3 pg (ref 26.0–34.0)
MCH: 30.9 pg (ref 26.0–34.0)
MCHC: 33.6 g/dL (ref 30.0–36.0)
MCHC: 34.2 g/dL (ref 30.0–36.0)
MCV: 90.2 fL (ref 78.0–100.0)
MCV: 90.4 fL (ref 78.0–100.0)
PLATELETS: 274 10*3/uL (ref 150–400)
PLATELETS: 273 10*3/uL (ref 150–400)
RBC: 2.49 MIL/uL — ABNORMAL LOW (ref 3.87–5.11)
RBC: 2.54 MIL/uL — ABNORMAL LOW (ref 3.87–5.11)
RDW: 15.8 % — ABNORMAL HIGH (ref 11.5–15.5)
RDW: 15.7 % — AB (ref 11.5–15.5)
WBC: 7.9 10*3/uL (ref 4.0–10.5)
WBC: 8 10*3/uL (ref 4.0–10.5)

## 2016-02-05 LAB — RENAL FUNCTION PANEL
Albumin: 2.3 g/dL — ABNORMAL LOW (ref 3.5–5.0)
Anion gap: 10 (ref 5–15)
BUN: 37 mg/dL — ABNORMAL HIGH (ref 6–20)
CALCIUM: 8 mg/dL — AB (ref 8.9–10.3)
CO2: 24 mmol/L (ref 22–32)
CREATININE: 6.66 mg/dL — AB (ref 0.44–1.00)
Chloride: 103 mmol/L (ref 101–111)
GFR calc Af Amer: 9 mL/min — ABNORMAL LOW (ref >60)
GFR calc non Af Amer: 8 mL/min — ABNORMAL LOW (ref >60)
GLUCOSE: 131 mg/dL — AB (ref 65–99)
Phosphorus: 5.8 mg/dL — ABNORMAL HIGH (ref 2.5–4.6)
Potassium: 4.3 mmol/L (ref 3.5–5.1)
SODIUM: 137 mmol/L (ref 135–145)

## 2016-02-05 LAB — BASIC METABOLIC PANEL
Anion gap: 11 (ref 5–15)
BUN: 37 mg/dL — ABNORMAL HIGH (ref 6–20)
CALCIUM: 8 mg/dL — AB (ref 8.9–10.3)
CO2: 24 mmol/L (ref 22–32)
CREATININE: 6.66 mg/dL — AB (ref 0.44–1.00)
Chloride: 102 mmol/L (ref 101–111)
GFR calc Af Amer: 9 mL/min — ABNORMAL LOW (ref >60)
GFR calc non Af Amer: 8 mL/min — ABNORMAL LOW (ref >60)
GLUCOSE: 131 mg/dL — AB (ref 65–99)
Potassium: 4.3 mmol/L (ref 3.5–5.1)
Sodium: 137 mmol/L (ref 135–145)

## 2016-02-05 LAB — TYPE AND SCREEN
Blood Product Expiration Date: 201712192359
ISSUE DATE / TIME: 201712120823
Unit Type and Rh: 5100

## 2016-02-05 MED ORDER — OXYCODONE HCL 5 MG PO TABS
5.0000 mg | ORAL_TABLET | Freq: Once | ORAL | Status: AC
  Administered 2016-02-05: 5 mg via ORAL
  Filled 2016-02-05: qty 1

## 2016-02-05 NOTE — Progress Notes (Signed)
Subjective: Patient reports fullness in her abdomen is unchanged. Swelling and pain in her right thigh and right knee have improved. She is complaining of soreness in her right neck/shoulder from her tunneled HD catheter placement yesterday.   Objective:  Vital signs in last 24 hours: Vitals:   02/04/16 2215 02/04/16 2245 02/04/16 2312 02/05/16 0527  BP: 126/74 119/63 116/64 111/61  Pulse: 80  91 79  Resp: 16 16 16 16   Temp: 98.2 F (36.8 C) 98.2 F (36.8 C) 98.4 F (36.9 C) 98.4 F (36.9 C)  TempSrc: Oral Oral Oral Oral  SpO2:   98% 93%  Weight:  200 lb 9.9 oz (91 kg) 200 lb (90.7 kg)   Height:       Physical Exam Constitutional: NAD, appears comfortable HEENT: Atraumatic, normocephalic. PERRL, anicteric sclera.  Neck: Supple, trachea midline. Right IJ TDC in place; c/d/i Cardiovascular: RRR, no murmurs, rubs, or gallops.  Pulmonary/Chest: CTAB, no wheezes, rales, or rhonchi. No chest wall abnormalities.  Abdominal: Soft, non tender, non distended. +BS.  Extremities: Right leg with ACE compression wrap; Swelling improved. Distal pulses 2+ bilaterally.  Neurological: A&Ox3, CN II - XII grossly intact.  Skin: No rashes or erythema  Psychiatric: Normal mood and affect   Assessment/Plan:  AKI Secondary to Rhabdomyolysis: CK has resolved. Had right IJ tunneled catheter placed 12/12 per vascular without complication. Received HD yesterday. Creatinine down to 6.66 today. UOP 500 cc/24 hrs. CT abdomen pelvis with bilateral pleural effusions. Likely due to hypervolemia. Patient is afebrile and denies SOB/cough. No clinical signs of PNA. Will continue to monitor.  -- Strict I/Os, daily weights  -- Nephrology following, appreciate recs -- No HD today, reassess tomorrow  -- Percocet q6hrs prn for pain  -- Daily labs  Anemia: Hgb trending down for the past week from 10 > 6.6yesterday. S/p 1 unit of pRBC with good response in hemoglobin. Stable at 7.7 today. Repeat CT abdomen  pelvis yesterday showed stable intraperitoneal hematoma. Asymptomatic aside from fullness in her belly. Denies dizziness/lightheadedness, SOB, and chest pain. Will continue to monitor.  -- FOBT negative -- Daily labs -- Transfuse for Hgb < 7.0 -- ESA per nephrology   Recurrent Right Knee Effusion: S/p arthrocentesis x 2 with a total of 85 cc removed.  Better today but a moderate effusion persists. MRI showed effusion and possible tear of of medial patellofemoral ligament. Discussed with ortho, no need for surgery at this time. Outpatient elective surgery if pain persists.  -- Cultures NGTD -- Continue to monitor  -- Wrap with ACE bandage, elevate  R Thigh Pain and Swelling:Improving, no signs of compartment syndrome.  -- ACE compression wrap, and elevation for swelling -- Continue to monitor -- PT/OT  Acute Hepatitis: In the setting of IV drug use and rhabdomyolysis. AST and ALT down 1900 -> 87 and 705 -->161 respectively. Hep C ab positive, viral load elevated at 7,000.Likely multifactorial due to viral hepatitis and shock liver after possible transient drop in BP while using heroin. HIV negative.  -- Avoid hepatotoxic meds -- Outpatient ID referral   Left Facial Swelling: Improved.Facial CT with enlarged masseter muscle and soft tissue swelling, likely posttraumatic from her fall. No osseous abnormalities.  -- Continue to monitor   Opioid use disorder:  -- HIV negative -- May consider Suboxone as outpatient  FEN: IVF, lasix, and HDper nephro, monitoring electrolytes, regular diet VTE ppx: SCDs Code Status: FULL   Dispo: Anticipated discharge pending further HD and hopeful return of renal  function.   Alejandra Pollarolyn Tylie Golonka, MD 02/05/2016, 6:28 AM Pager: [phone]

## 2016-02-05 NOTE — Progress Notes (Signed)
Nephrology is assessing daily for need for HD.  Pt. had TDC placed yesterday.  She reports she is sore this am in catheter site.  I updated pt. that we continue to follow along for potential need for IP Rehab, for medical readiness and bed availability.  Fae PippinMelissa Bowie will follow pt. Thursday/Friday in my absence.  Please call if questions.  Weldon PickingSusan Dave Mannes PT Inpatient Rehab Admissions Coordinator Cell [phone] Office [phone]

## 2016-02-05 NOTE — Progress Notes (Signed)
Admit: 01/23/2016 LOS: 13  81F AKI 2/2 Rhabdomyolysis; heroin overdose  Subjective:  TDC yesterday HD yesterday: 2L UF, 3K bath; 91kg post weight UOP 0.5L  12/12 0701 - 12/13 0700 In: 1530 [P.O.:720; I.V.:430; Blood:380] Out: 2525 [Urine:500; Blood:25]  Filed Weights   02/04/16 1935 02/04/16 2245 02/04/16 2312  Weight: 93 kg (205 lb 0.4 oz) 91 kg (200 lb 9.9 oz) 90.7 kg (200 lb)    Scheduled Meds: . acetaminophen  1,000 mg Oral BID  . darbepoetin (ARANESP) injection - DIALYSIS  100 mcg Intravenous Q Tue-HD  . diclofenac sodium  2 g Topical QID  . ramelteon  8 mg Oral QHS  . sodium chloride flush  3 mL Intravenous Q12H   Continuous Infusions: PRN Meds:.heparin, methocarbamol, oxyCODONE-acetaminophen, polyethylene glycol, promethazine  Current Labs: reviewed    Physical Exam:  Blood pressure 111/61, pulse 79, temperature 98.4 F (36.9 C), temperature source Oral, resp. rate 16, height 5\' 6"  (1.676 m), weight 90.7 kg (200 lb), last menstrual period 01/13/2016, SpO2 93 %. NAD RRR CTAB No LEE. RLE Bandaged Some abd distensin  A 1. 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 Rhabdomyolysis 2/2 prolonged immobility 3. Heroin overdose 4. Anemia 5. HCV Infection  P 1. Daily assessment for HD; not today 2. Need labs this AM to follow SCr trend; ordered 3. ESA with HD, IMTS eval of anemia -- Aranesp 100 today 4. Daily weights, Daily Renal Panel, Strict I/Os, Avoid nephrotoxins (NSAIDs, judicious IV Contrast)     Alejandra Heckyan [name] MD 02/05/2016, 8:52 AM   Recent Labs Lab 02/02/16 0252 02/03/16 0812 02/04/16 0554  NA 135 134* 133*  K 3.9 4.3 4.7  CL 100* 102 101  CO2 25 21* 18*  GLUCOSE 96 94 87  BUN 43* 54* 64*  CREATININE 5.73* 7.50* 8.76*  CALCIUM 7.7* 8.2* 8.2*  PHOS 5.2* 6.3* 6.9*    Recent Labs Lab 02/03/16 1504 02/04/16 0554 02/04/16 1729  WBC 11.0* 9.6 8.1  HGB 7.0* 6.6* 7.4*  HCT 20.4* 19.6* 21.9*  MCV 90.3 92.0 89.0  PLT 237 232  278

## 2016-02-05 NOTE — Progress Notes (Deleted)
Physical Therapy Treatment Patient Details Name: Rosalva FerronRebecca D Burgio MRN: [id number] DOB: 1985-03-01 Today's Date: 02/06/2016    History of Present Illness      PT Comments    Progressing well as her bodily aches and pains lessen.  Pt maintains her exercise outside of the therapy section.  Follow Up Recommendations        Equipment Recommendations       Recommendations for Other Services       Precautions / Restrictions      Mobility  Bed Mobility                  Transfers                    Ambulation/Gait                 Stairs            Wheelchair Mobility    Modified Rankin (Stroke Patients Only)       Balance                                    Cognition                            Exercises      General Comments        Pertinent Vitals/Pain      Home Living                      Prior Function            PT Goals (current goals can now be found in the care plan section)      Frequency           PT Plan      Co-evaluation             End of Session           Time:  -     Charges:                       G CodesEliseo Gum:      Naraya Stoneberg V [place] Rosamond 02/06/2016, 3:20 PM  02/06/2016  [place] BingKen Sorren Vallier, PT [phone]  (pager)

## 2016-02-06 LAB — RENAL FUNCTION PANEL
ANION GAP: 10 (ref 5–15)
Albumin: 2.3 g/dL — ABNORMAL LOW (ref 3.5–5.0)
BUN: 41 mg/dL — ABNORMAL HIGH (ref 6–20)
CALCIUM: 8.1 mg/dL — AB (ref 8.9–10.3)
CO2: 26 mmol/L (ref 22–32)
Chloride: 99 mmol/L — ABNORMAL LOW (ref 101–111)
Creatinine, Ser: 7.42 mg/dL — ABNORMAL HIGH (ref 0.44–1.00)
GFR calc non Af Amer: 7 mL/min — ABNORMAL LOW (ref >60)
GFR, EST AFRICAN AMERICAN: 8 mL/min — AB (ref >60)
Glucose, Bld: 83 mg/dL (ref 65–99)
Phosphorus: 6.4 mg/dL — ABNORMAL HIGH (ref 2.5–4.6)
Potassium: 4.3 mmol/L (ref 3.5–5.1)
SODIUM: 135 mmol/L (ref 135–145)

## 2016-02-06 LAB — CULTURE, BLOOD (ROUTINE X 2)
CULTURE: NO GROWTH
Culture: NO GROWTH

## 2016-02-06 LAB — CBC
HEMATOCRIT: 24 % — AB (ref 36.0–46.0)
HEMOGLOBIN: 8 g/dL — AB (ref 12.0–15.0)
MCH: 30.3 pg (ref 26.0–34.0)
MCHC: 33.3 g/dL (ref 30.0–36.0)
MCV: 90.9 fL (ref 78.0–100.0)
Platelets: 293 10*3/uL (ref 150–400)
RBC: 2.64 MIL/uL — ABNORMAL LOW (ref 3.87–5.11)
RDW: 15.6 % — ABNORMAL HIGH (ref 11.5–15.5)
WBC: 7 10*3/uL (ref 4.0–10.5)

## 2016-02-06 MED ORDER — OXYCODONE HCL 5 MG PO TABS
5.0000 mg | ORAL_TABLET | Freq: Once | ORAL | Status: AC
  Administered 2016-02-06: 5 mg via ORAL
  Filled 2016-02-06: qty 1

## 2016-02-06 NOTE — Progress Notes (Signed)
Inpatient Rehabilitation  Following along at a distance for IP Rehab needs. Please call with questions.  Vernesha Talbot, M.A., CCC/SLP Admission Coordinator  [facility]  Cell [phone]  

## 2016-02-06 NOTE — Progress Notes (Signed)
Late Entry Change to Recommendations from Progress Note 12/13    02/05/16 1424  PT Visit Information  Last PT Received On 02/05/16  Assistance Needed +1  History of Present Illness Pt adm with rhabdomyolosis after being on the floor at home for >6 hours after using heroin.  CK > 50,000. Pt also with AKI and acute hepatitis. PMH - substance abuse, chronic pain, depression  Subjective Data  Patient Stated Goal get better  Pain Assessment  Pain Assessment 0-10  Pain Score 8  Pain Location r LE at knee and thigh  Pain Descriptors / Indicators Sore  Pain Intervention(s) Monitored during session  Cognition  Arousal/Alertness Awake/alert  Behavior During Therapy WFL for tasks assessed/performed  Overall Cognitive Status Within Functional Limits for tasks assessed  Bed Mobility  Overal bed mobility Needs Assistance  Bed Mobility Supine to Sit;Sit to Supine  Supine to sit Modified independent (Device/Increase time)  Sit to supine Supervision  General bed mobility comments pt is generally independent, but struggles at times and is slow to mobilize   Transfers  Overall transfer level Needs assistance  Equipment used Rolling walker (2 wheeled)  Transfers Sit to/from Stand  Sit to Stand Supervision  General transfer comment cues for hand placement  Ambulation/Gait  Ambulation/Gait assistance Min guard  Ambulation Distance (Feet) 28 Feet  Assistive device Rolling walker (2 wheeled)  Gait Pattern/deviations Step-through pattern  General Gait Details moderate use of the RW, wide BOS, not as antalgic today.  Balance  Overall balance assessment Needs assistance  Sitting-balance support No upper extremity supported  Sitting balance-Leahy Scale Good  Standing balance-Leahy Scale Poor  Standing balance comment reliant on RW  Exercises  Exercises General Lower Extremity  General Exercises - Lower Extremity  Ankle Circles/Pumps AROM;Both;10 reps;Supine  Quad Sets AROM;Both;15 reps  Gluteal  Sets AROM;Both;10 reps  Heel Slides AROM;Strengthening;Both;15 reps;Supine;Other (comment) (graded moderately heavy resistance)  Hip ABduction/ADduction AROM;Strengthening;Both;15 reps;Supine  Straight Leg Raises Strengthening;AAROM;AROM;Both;10 reps  PT - End of Session  Patient left in bed  Nurse Communication Mobility status  PT - Assessment/Plan  PT Plan Current plan remains appropriate  PT Frequency (ACUTE ONLY) Min 3X/week  Follow Up Recommendations CIR  PT equipment Rolling walker with 5" wheels  PT Goal Progression  Progress towards PT goals Progressing toward goals  Acute Rehab PT Goals  PT Goal Formulation With patient  Time For Goal Achievement 02/07/16  Potential to Achieve Goals Good  PT Time Calculation  PT Start Time (ACUTE ONLY) 1309  PT Stop Time (ACUTE ONLY) 1347  PT Time Calculation (min) (ACUTE ONLY) 38 min  PT General Charges  $$ ACUTE PT VISIT 1 Procedure  PT Treatments  $Gait Training 8-22 mins  $Therapeutic Exercise 8-22 mins  $Therapeutic Activity 8-22 mins   02/06/2016  [place] BingKen Jazzmyn Filion, PT [phone]  (pager)

## 2016-02-06 NOTE — Progress Notes (Signed)
Admit: 01/23/2016 LOS: 14  34F AKI 2/2 Rhabdomyolysis; heroin overdose  Subjective:  Perhaps increasing UOP K and HCO3 ok SCR up further VSS; feels well Discussed plans upon discharge, how to avoid alcohol and substance use   12/13 0701 - 12/14 0700 In: 540 [P.O.:540] Out: 825 [Urine:825]  Filed Weights   02/04/16 2245 02/04/16 2312 02/05/16 2045  Weight: 91 kg (200 lb 9.9 oz) 90.7 kg (200 lb) 88 kg (194 lb)    Scheduled Meds: . acetaminophen  1,000 mg Oral BID  . darbepoetin (ARANESP) injection - DIALYSIS  100 mcg Intravenous Q Tue-HD  . diclofenac sodium  2 g Topical QID  . ramelteon  8 mg Oral QHS  . sodium chloride flush  3 mL Intravenous Q12H   Continuous Infusions: PRN Meds:.heparin, methocarbamol, oxyCODONE-acetaminophen, polyethylene glycol, promethazine  Current Labs: reviewed    Physical Exam:  Blood pressure 113/64, pulse 71, temperature 98.3 F (36.8 C), resp. rate 16, height 5\' 6"  (1.676 m), weight 88 kg (194 lb), last menstrual period 01/13/2016, SpO2 96 %. NAD RRR CTAB No LEE. RLE Bandaged Some abd distensin R IJ TDC C/D/I  A 1. 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 Rhabdomyolysis 2/2 prolonged immobility 3. Heroin overdose 4. Anemia 5. HCV Infection  P 1. Daily assessment for HD; not today given inc UOP 2. ESA with HD, IMTS eval of anemia -- Aranesp 100 weekly on Tuesday 3. Daily weights, Daily Renal Panel, Strict I/Os, Avoid nephrotoxins (NSAIDs, judicious IV Contrast)     Sabra Heckyan Khriz Liddy MD 02/06/2016, 8:19 AM   Recent Labs Lab 02/04/16 0554 02/05/16 1001 02/06/16 0318  NA 133* 137  137 135  K 4.7 4.3  4.3 4.3  CL 101 103  102 99*  CO2 18* 24  24 26   GLUCOSE 87 131*  131* 83  BUN 64* 37*  37* 41*  CREATININE 8.76* 6.66*  6.66* 7.42*  CALCIUM 8.2* 8.0*  8.0* 8.1*  PHOS 6.9* 5.8* 6.4*    Recent Labs Lab 02/04/16 1729 02/05/16 1001 02/06/16 0318  WBC 8.1 8.0  7.9 7.0  HGB 7.4* 7.7*  7.7* 8.0*   HCT 21.9* 22.9*  22.5* 24.0*  MCV 89.0 90.2  90.4 90.9  PLT 278 274  273 293

## 2016-02-06 NOTE — Progress Notes (Signed)
PT Cancellation Note  Patient Details Name: Alejandra FerronRebecca D Park MRN: [id number] DOB: December 28, 1985   Cancelled Treatment:    Reason Eval/Treat Not Completed: Patient declined, no reason specified.  Pt stated she had been up about 4 times today including taking a shower and not wanting to do anymore today 02/06/2016  [place] BingKen Gustav Knueppel, PT [phone]  (pager)   Eliseo GumKenneth V [place]au 02/06/2016, 4:23 PM

## 2016-02-06 NOTE — Progress Notes (Signed)
Subjective: Patient continues to improve. Swelling in her right leg has improved significantly with compression wrapping. She continues to complain of labial swelling and pain around her right IJ tunneled catheter site, however better today. She was able to shower today and stand on her right knee for a period of time. Urine output is improving as well, 825 cc/ 24 hours. Patient is very hopeful.   Objective:  Vital signs in last 24 hours: Vitals:   02/05/16 0900 02/05/16 1724 02/05/16 2045 02/06/16 0516  BP: (!) 118/59 (!) 121/52 122/65 113/64  Pulse: 98 77 (!) 119 71  Resp: 17 16 (!) 22 16  Temp: 98.5 F (36.9 C) 98.2 F (36.8 C) 98.4 F (36.9 C) 98.3 F (36.8 C)  TempSrc: Oral Oral    SpO2: 95% 98% 96% 96%  Weight:   194 lb (88 kg)   Height:        Physical Exam Constitutional: NAD, appears comfortable HEENT: Atraumatic, normocephalic. PERRL, anicteric sclera.  Neck: Supple, trachea midline. Right IJ TDC in place with overlying bandage  Cardiovascular:RRR, no murmurs, rubs, or gallops.  Pulmonary/Chest:CTAB, no wheezes, rales, or rhonchi. No chest wall abnormalities.  Abdominal:Soft, non tender, non distended. +BS.  Extremities: Right leg with ACE compression wrap; Swelling significantly improved today. Distal pulses 2+ bilaterally.  Neurological:A&Ox3, CN II - XII grossly intact.  Skin: No rashes or erythema  Psychiatric:Normal mood and affect  Assessment/Plan:  AKI Secondary to Rhabdomyolysis:CK has resolved. Had right IJ tunneled catheter placed 12/12 per vascular without complication. Last HD session was 12/12. Urine output is improved today, 825 cc / 24 hours. Creatinine still elevated, 7.42 today. But with the increased urine output we remain hopeful for return of renal function and a subsequent decline in her creatinine. Will hold off on further HD for now per nephrology. Reassess need for HD daily.   -- Nephrology following, appreciate recs -- Strict I/Os,  daily weights  -- No HD today, reassess tomorrow  -- Percocet q6hrs prn for pain  -- Daily labs  Anemia: Hgb trended down over the past week from 10 > 6.6 on 12/12. S/p 1 unit of pRBC with good response in hemoglobin. Repeat CT abdomen pelvis showed stable intraperitoneal hematoma. Asymptomatic aside from fullness in her belly which has improved today. Hemoglobin stable at 8.0 today. Will continue to monitor for signs and symptoms of anemia.  -- FOBT negative -- Daily labs -- Transfuse for Hgb < 7.0 -- ESA per nephrology   Recurrent Right Knee Effusion: S/p arthrocentesis x 2 with a total of 85 cc removed. MRI showed a moderate effusion and possible tear of of medial patellofemoral ligament. Discussed with ortho, no need for surgery at this time. Outpatient elective surgery if pain persists. Effusion is significantly improved today. She is partial weight bearing and working with PT daily. Reports she was able to stand on her right leg for a period of time in the shower today. Encourage patient to continue working with PT.  -- Cultures no growth x 5 days (final) -- Continue to monitor  -- Wrap with ACE bandage, elevate  R Thigh Pain and Swelling:Improving, no signs of compartment syndrome.  -- ACE compression wrap, and elevation for swelling -- Continue to monitor -- PT/OT  Acute Hepatitis: In the setting of IV drug use and rhabdomyolysis. AST and ALT down 1900 -> 87 and 705 -->161 respectively. Hep C ab positive, viral load elevated at 7,000.Likely multifactorial due to viral hepatitis and shock liver after  possible transient drop in BP while using heroin. HIV negative.  -- Avoid hepatotoxic meds -- Outpatient ID referral   Left Facial Swelling: Improved.Facial CT with enlarged masseter muscle and soft tissue swelling, likely posttraumatic from her fall. No osseous abnormalities.  -- Continue to monitor   Opioid use disorder:  -- HIV negative -- May consider Suboxone as  outpatient  FEN: IVF, lasix, and HDper nephro, monitoring electrolytes, regular diet VTE ppx: SCDs Code Status: FULL  Dispo: Anticipated discharge pending further HD and hopeful return of renal function.   Alejandra Pollarolyn Kaliyan Osbourn, MD 02/06/2016, 7:03 AM Pager: [phone]

## 2016-02-07 DIAGNOSIS — M25461 Effusion, right knee: Secondary | ICD-10-CM

## 2016-02-07 DIAGNOSIS — D649 Anemia, unspecified: Secondary | ICD-10-CM

## 2016-02-07 DIAGNOSIS — M79651 Pain in right thigh: Secondary | ICD-10-CM

## 2016-02-07 DIAGNOSIS — B179 Acute viral hepatitis, unspecified: Secondary | ICD-10-CM

## 2016-02-07 DIAGNOSIS — Z992 Dependence on renal dialysis: Secondary | ICD-10-CM

## 2016-02-07 DIAGNOSIS — M6282 Rhabdomyolysis: Secondary | ICD-10-CM

## 2016-02-07 DIAGNOSIS — N179 Acute kidney failure, unspecified: Secondary | ICD-10-CM

## 2016-02-07 DIAGNOSIS — Z9889 Other specified postprocedural states: Secondary | ICD-10-CM

## 2016-02-07 DIAGNOSIS — M7989 Other specified soft tissue disorders: Secondary | ICD-10-CM

## 2016-02-07 DIAGNOSIS — F1920 Other psychoactive substance dependence, uncomplicated: Secondary | ICD-10-CM

## 2016-02-07 LAB — CBC
HCT: 23.6 % — ABNORMAL LOW (ref 36.0–46.0)
Hemoglobin: 7.7 g/dL — ABNORMAL LOW (ref 12.0–15.0)
MCH: 29.7 pg (ref 26.0–34.0)
MCHC: 32.6 g/dL (ref 30.0–36.0)
MCV: 91.1 fL (ref 78.0–100.0)
PLATELETS: 337 10*3/uL (ref 150–400)
RBC: 2.59 MIL/uL — ABNORMAL LOW (ref 3.87–5.11)
RDW: 15.4 % (ref 11.5–15.5)
WBC: 7.1 10*3/uL (ref 4.0–10.5)

## 2016-02-07 LAB — RENAL FUNCTION PANEL
ALBUMIN: 2.3 g/dL — AB (ref 3.5–5.0)
Anion gap: 13 (ref 5–15)
BUN: 48 mg/dL — AB (ref 6–20)
CALCIUM: 8.3 mg/dL — AB (ref 8.9–10.3)
CO2: 23 mmol/L (ref 22–32)
CREATININE: 8.3 mg/dL — AB (ref 0.44–1.00)
Chloride: 100 mmol/L — ABNORMAL LOW (ref 101–111)
GFR calc Af Amer: 7 mL/min — ABNORMAL LOW (ref >60)
GFR, EST NON AFRICAN AMERICAN: 6 mL/min — AB (ref >60)
GLUCOSE: 80 mg/dL (ref 65–99)
PHOSPHORUS: 7.4 mg/dL — AB (ref 2.5–4.6)
Potassium: 4.6 mmol/L (ref 3.5–5.1)
SODIUM: 136 mmol/L (ref 135–145)

## 2016-02-07 MED ORDER — PROMETHAZINE HCL 12.5 MG PO TABS
6.2500 mg | ORAL_TABLET | Freq: Four times a day (QID) | ORAL | Status: DC | PRN
  Filled 2016-02-07: qty 1

## 2016-02-07 NOTE — Progress Notes (Signed)
Physical Therapy Treatment Patient Details Name: Alejandra FerronRebecca D Finstad MRN: [id number] DOB: 03-Jan-1986 Today's Date: 02/07/2016    History of Present Illness Pt adm with rhabdomyolosis after being on the floor at home for >6 hours after using heroin.  CK > 50,000. Pt also with AKI and acute hepatitis. PMH - substance abuse, chronic pain, depression    PT Comments    Pt able to make significant progress with ambulation today, 150 ft with rw. Pt continues to have decreased strength/ROM and reports of pain through the Rt LE. Reviewing and encouraging gently bed exercises. PT to continue to follow and progress mobility as tolerated. Modification to the D/C recommendation may be needed as the patient continues to progress.   Follow Up Recommendations  CIR     Equipment Recommendations  Rolling walker with 5" wheels    Recommendations for Other Services       Precautions / Restrictions Precautions Precautions: Fall Restrictions Weight Bearing Restrictions: No    Mobility  Bed Mobility Overal bed mobility: Needs Assistance Bed Mobility: Supine to Sit;Sit to Supine     Supine to sit: Modified independent (Device/Increase time) Sit to supine: Modified independent (Device/Increase time)   General bed mobility comments: using rail to assist  Transfers Overall transfer level: Needs assistance Equipment used: Rolling walker (2 wheeled) Transfers: Sit to/from Stand Sit to Stand: Supervision         General transfer comment: supervision for safety  Ambulation/Gait Ambulation/Gait assistance: Min guard Ambulation Distance (Feet): 150 Feet Assistive device: Rolling walker (2 wheeled) Gait Pattern/deviations: Step-through pattern Gait velocity: decreased   General Gait Details: cues for posture and attempting to decrease dependence on rw   Stairs            Wheelchair Mobility    Modified Rankin (Stroke Patients Only)       Balance Overall balance assessment:  Needs assistance Sitting-balance support: No upper extremity supported Sitting balance-Leahy Scale: Good     Standing balance support: Bilateral upper extremity supported Standing balance-Leahy Scale: Poor Standing balance comment: using UEs for support                    Cognition Arousal/Alertness: Awake/alert Behavior During Therapy: Flat affect Overall Cognitive Status: Within Functional Limits for tasks assessed                      Exercises General Exercises - Lower Extremity Ankle Circles/Pumps: AROM;Both;10 reps;Supine Quad Sets: Strengthening;Right;5 reps Hip ABduction/ADduction: Both;10 reps;Standing Hip Flexion/Marching: Both;10 reps;Standing    General Comments        Pertinent Vitals/Pain Pain Assessment: 0-10 Pain Score: 7  Pain Location: rt knee/thigh Pain Descriptors / Indicators: Sore Pain Intervention(s): Limited activity within patient's tolerance;Monitored during session    Home Living                      Prior Function            PT Goals (current goals can now be found in the care plan section) Acute Rehab PT Goals Patient Stated Goal: get kidneys working  PT Goal Formulation: With patient Time For Goal Achievement: 02/14/16 Potential to Achieve Goals: Good Progress towards PT goals: Progressing toward goals    Frequency    Min 3X/week      PT Plan Current plan remains appropriate    Co-evaluation             End of Session  Time: 9147-82951149-1212 PT Time Calculation (min) (ACUTE ONLY): 23 min  Charges:  $Gait Training: 8-22 mins $Therapeutic Exercise: 8-22 mins                    G Codes:      Christiane HaBenjamin J. Terryann Verbeek, PT, CSCS Pager [phone] Office [phone]  02/07/2016, 12:21 PM

## 2016-02-07 NOTE — Progress Notes (Signed)
Admit: 01/23/2016 LOS: 15  24F AKI 2/2 Rhabdomyolysis; heroin overdose  Subjective:  Nearly 2L UOP K and HCO3 ok SCR up further; BUN less so VSS; feels well    12/14 0701 - 12/15 0700 In: 720 [P.O.:720] Out: 1975 [Urine:1975]  Filed Weights   02/04/16 2312 02/05/16 2045 02/06/16 2001  Weight: 90.7 kg (200 lb) 88 kg (194 lb) 90.3 kg (199 lb)    Scheduled Meds: . acetaminophen  1,000 mg Oral BID  . darbepoetin (ARANESP) injection - DIALYSIS  100 mcg Intravenous Q Tue-HD  . diclofenac sodium  2 g Topical QID  . ramelteon  8 mg Oral QHS  . sodium chloride flush  3 mL Intravenous Q12H   Continuous Infusions: PRN Meds:.heparin, methocarbamol, oxyCODONE-acetaminophen, polyethylene glycol, promethazine  Current Labs: reviewed    Physical Exam:  Blood pressure 126/62, pulse 76, temperature 98.4 F (36.9 C), resp. rate 19, height 5\' 6"  (1.676 m), weight 90.3 kg (199 lb), last menstrual period 01/13/2016, SpO2 98 %. NAD RRR CTAB No LEE. RLE Bandaged Some abd distensin R IJ TDC C/D/I  A 1. 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 Rhabdomyolysis 2/2 prolonged immobility 3. Heroin overdose 4. Anemia 5. HCV Infection 6. Hyperphosphatemia  P 1. Daily assessment for HD; not today given inc UOP; more optimism for recovery 2. ESA with HD, IMTS eval of anemia -- Aranesp 100 weekly on Tuesday 3. Daily weights, Daily Renal Panel, Strict I/Os, Avoid nephrotoxins (NSAIDs, judicious IV Contrast)  4. No binder at this time    Sabra Heckyan Khiem Gargis MD 02/07/2016, 8:09 AM   Recent Labs Lab 02/05/16 1001 02/06/16 0318 02/07/16 0453  NA 137  137 135 136  K 4.3  4.3 4.3 4.6  CL 103  102 99* 100*  CO2 24  24 26 23   GLUCOSE 131*  131* 83 80  BUN 37*  37* 41* 48*  CREATININE 6.66*  6.66* 7.42* 8.30*  CALCIUM 8.0*  8.0* 8.1* 8.3*  PHOS 5.8* 6.4* 7.4*    Recent Labs Lab 02/05/16 1001 02/06/16 0318 02/07/16 0453  WBC 8.0  7.9 7.0 7.1  HGB 7.7*  7.7* 8.0*  7.7*  HCT 22.9*  22.5* 24.0* 23.6*  MCV 90.2  90.4 90.9 91.1  PLT 274  273 293 337

## 2016-02-07 NOTE — Progress Notes (Signed)
Subjective: Patient reports worsening of her right knee pain after increased activity yesterday. Overall pain and swelling continue to improve. She reports an increase in her urine output since yesterday and remains hopeful for return of her renal function.   Objective:  Vital signs in last 24 hours: Vitals:   02/06/16 1710 02/06/16 2001 02/07/16 0449 02/07/16 0903  BP: 120/74 122/71 126/62 127/68  Pulse: 69 70 76 66  Resp: 17 (!) 22 19 17   Temp: 98.6 F (37 C) 98.6 F (37 C) 98.4 F (36.9 C) 98 F (36.7 C)  TempSrc: Oral   Oral  SpO2: 97% 97% 98% 99%  Weight:  199 lb (90.3 kg)    Height:       Physical Exam Constitutional: NAD, appears comfortable HEENT: Atraumatic, normocephalic. PERRL, anicteric sclera.  Neck: Supple, trachea midline. Right IJ TDC in place with overlying bandage  Cardiovascular:RRR, no murmurs, rubs, or gallops.  Pulmonary/Chest:CTAB, no wheezes, rales, or rhonchi. No chest wall abnormalities.  Abdominal:Soft, non tender, non distended. +BS.  Extremities: Right leg with ACE compression wrap; Swelling significantly improved today. Distal pulses 2+ bilaterally.  Neurological:A&Ox3, CN II - XII grossly intact.  Skin: No rashes or erythema  Psychiatric:Normal mood and affect  Assessment/Plan:  AKA Secondary to Rhabdomyolysis: CK has resolved. S/p right IJ tunneled catheter placed 12/12 per vascular without complication. Last HD session was 12/12. Urine output continues to improve, 1.9 L / 24 hours. Creatinine still elevated, 8.3 today. Given her significant improvement in urine output we remain hopeful for return of renal function and a subsequent decline in her creatinine. Will hold off on further HD for now per nephrology. Reassess need for HD daily.   -- Nephrology following, appreciate recs -- Strict I/Os, daily weights  -- No HD today, reassess tomorrow  -- Percocet q6hrs prn for pain; discussed weaning prior to discharge. Will decrease to q8hrs prn  tomorrow. -- Daily labs  Anemia: Hgb trended down over the past week from 10 >6.6 on 12/12. S/p 1 unit of pRBC with good response in hemoglobin. Repeat CT abdomen pelvis 12/13 showed stable intraperitoneal hematoma. FOBT negative, no other source identified. Asymptomatic aside from fullness in her belly which continues to improved. Hemoglobin is stable today, 7.7. Will continue to monitor for signs and symptoms of anemia.  -- Daily CBC  -- Transfuse prn for Hgb < 7.0 -- ESA per nephrology   -- Discussed with pharmacy; ESA initiation can cause a drop in iron that may be contributing to her anemia. Will check iron panel for tomorrow.   Recurrent Right Knee Effusion: S/p arthrocentesis x 2 with a total of 85 cc removed. MRI 12/8 showed a moderate effusion and possible tear of of medial patellofemoral ligament. Discussed with ortho, no need for surgery at this time. Outpatient elective surgery if pain persists. Effusion is significantly improved today.  -- Daily PT -- Cultures no growth x 5 days (final) -- Continue to monitor  -- Wrap with ACE bandage, elevate  R Thigh Pain and Swelling:Improving, no signs of compartment syndrome.  -- ACE compression wrap, and elevation for swelling -- Continue to monitor -- PT/OT  Acute Hepatitis: In the setting of IV drug use and rhabdomyolysis. AST and ALT down 1900 -> 87 and 705 -->161 respectively. Hep C ab positive, viral load elevated at 7,000.Likely multifactorial due to viral hepatitis and shock liver after possible transient drop in BP while using heroin. HIV negative.  -- Avoid hepatotoxic meds -- Outpatient ID referral  Opioid use disorder: Will discuss outpatient Suboxone prior to discharge.  -- HIV negative  FEN: IVF, lasix, and HDper nephro, monitoring electrolytes, regular diet VTE ppx: SCDs Code Status: FULL  Dispo: Anticipated discharge pending improvement in renal function.   Alejandra Pollarolyn Britanny Marksberry, MD 02/07/2016, 2:10 PM Pager:  [phone]

## 2016-02-08 LAB — RENAL FUNCTION PANEL
ALBUMIN: 2.6 g/dL — AB (ref 3.5–5.0)
Anion gap: 17 — ABNORMAL HIGH (ref 5–15)
BUN: 54 mg/dL — ABNORMAL HIGH (ref 6–20)
CALCIUM: 8.9 mg/dL (ref 8.9–10.3)
CO2: 21 mmol/L — ABNORMAL LOW (ref 22–32)
Chloride: 100 mmol/L — ABNORMAL LOW (ref 101–111)
Creatinine, Ser: 8.89 mg/dL — ABNORMAL HIGH (ref 0.44–1.00)
GFR, EST AFRICAN AMERICAN: 6 mL/min — AB (ref >60)
GFR, EST NON AFRICAN AMERICAN: 5 mL/min — AB (ref >60)
GLUCOSE: 75 mg/dL (ref 65–99)
PHOSPHORUS: 8.1 mg/dL — AB (ref 2.5–4.6)
Potassium: 4.6 mmol/L (ref 3.5–5.1)
SODIUM: 138 mmol/L (ref 135–145)

## 2016-02-08 LAB — CBC
HCT: 25.6 % — ABNORMAL LOW (ref 36.0–46.0)
HEMOGLOBIN: 8.5 g/dL — AB (ref 12.0–15.0)
MCH: 30.6 pg (ref 26.0–34.0)
MCHC: 33.2 g/dL (ref 30.0–36.0)
MCV: 92.1 fL (ref 78.0–100.0)
Platelets: 419 10*3/uL — ABNORMAL HIGH (ref 150–400)
RBC: 2.78 MIL/uL — AB (ref 3.87–5.11)
RDW: 15.6 % — ABNORMAL HIGH (ref 11.5–15.5)
WBC: 8.1 10*3/uL (ref 4.0–10.5)

## 2016-02-08 LAB — IRON AND TIBC
IRON: 30 ug/dL (ref 28–170)
Saturation Ratios: 9 % — ABNORMAL LOW (ref 10.4–31.8)
TIBC: 318 ug/dL (ref 250–450)
UIBC: 288 ug/dL

## 2016-02-08 LAB — FERRITIN: Ferritin: 123 ng/mL (ref 11–307)

## 2016-02-08 MED ORDER — FERROUS SULFATE 325 (65 FE) MG PO TABS
325.0000 mg | ORAL_TABLET | Freq: Every day | ORAL | Status: DC
  Administered 2016-02-08 – 2016-02-10 (×3): 325 mg via ORAL
  Filled 2016-02-08 (×3): qty 1

## 2016-02-08 NOTE — Progress Notes (Signed)
Physical Therapy Treatment Patient Details Name: Alejandra FerronRebecca D Park MRN: [id number] DOB: June 15, 1985 Today's Date: 02/08/2016    History of Present Illness Pt adm with rhabdomyolosis after being on the floor at home for >6 hours after using heroin.  CK > 50,000. Pt also with AKI and acute hepatitis. PMH - substance abuse, chronic pain, depression    PT Comments    Patient making good progress with mobility and gait.  Able to ambulate 200' with RW and min guard assist.  Patient up in room with RW as PT entered.  Encouraged patient to have assist when OOB.  Patient reports she is not going to Inpatient Rehab for her knee.  Would recommend OP PT if patient has transportation.  If not, then  HHPT for continued therapy.   Follow Up Recommendations  OP PT vs Home health PT;Supervision - Intermittent     Equipment Recommendations  Rolling walker with 5" wheels    Recommendations for Other Services       Precautions / Restrictions Precautions Precautions: Fall Restrictions Weight Bearing Restrictions: No    Mobility  Bed Mobility Overal bed mobility: Modified Independent         Sit to supine: Modified independent (Device/Increase time)   General bed mobility comments: Increased time and use of rail  Transfers Overall transfer level: Needs assistance Equipment used: Rolling walker (2 wheeled) Transfers: Sit to/from Stand Sit to Stand: Supervision         General transfer comment: supervision for safety  Ambulation/Gait Ambulation/Gait assistance: Min guard Ambulation Distance (Feet): 200 Feet Assistive device: Rolling walker (2 wheeled) Gait Pattern/deviations: Step-through pattern;Decreased stride length;Trunk flexed Gait velocity: decreased Gait velocity interpretation: Below normal speed for age/gender General Gait Details: Verbal cues to stand upright and keep feet inside RW.  Assist for safety.  Patient c/o Rt knee pain during gait.   Stairs             Wheelchair Mobility    Modified Rankin (Stroke Patients Only)       Balance           Standing balance support: Single extremity supported Standing balance-Leahy Scale: Poor Standing balance comment: using UEs for support                    Cognition Arousal/Alertness: Awake/alert Behavior During Therapy: Flat affect Overall Cognitive Status: Within Functional Limits for tasks assessed                      Exercises Total Joint Exercises Knee Flexion: AROM;Both;10 reps;Standing General Exercises - Lower Extremity Hip ABduction/ADduction: Both;10 reps;Standing    General Comments        Pertinent Vitals/Pain Pain Assessment: 0-10 Pain Score: 6  Pain Location: rt knee/thigh Pain Descriptors / Indicators: Aching;Sharp;Sore Pain Intervention(s): Monitored during session;Repositioned    Home Living                      Prior Function            PT Goals (current goals can now be found in the care plan section) Acute Rehab PT Goals Patient Stated Goal: get kidneys working  Progress towards PT goals: Progressing toward goals    Frequency    Min 3X/week      PT Plan Discharge plan needs to be updated    Co-evaluation             End of Session Equipment Utilized During  Treatment: Gait belt Activity Tolerance: Patient tolerated treatment well;Patient limited by pain Patient left: in bed;with call bell/phone within reach     Time: 1521-1536 PT Time Calculation (min) (ACUTE ONLY): 15 min  Charges:  $Gait Training: 8-22 mins                    G Codes:      Alejandra AustriaSusan H Eryck Park 02/08/2016, 3:45 PM Durenda HurtSusan H. Renaldo Park, PT, [facility] Acute Rehab Services Pager [phone]

## 2016-02-08 NOTE — Progress Notes (Signed)
Subjective: Patient feels well today. Swelling continues to improve. Still cannot fully bear weight on her right leg but knee ROM is better. Discussed the plan to start her percocet taper today, patient is agreeable.   Objective:  Vital signs in last 24 hours: Vitals:   02/07/16 1732 02/07/16 2138 02/08/16 0402 02/08/16 0444  BP: 118/77 120/61 133/67   Pulse: 66 70 97   Resp: 18 19 20    Temp: 98.6 F (37 C) 99.2 F (37.3 C) 98.3 F (36.8 C)   TempSrc: Oral Oral Oral   SpO2: 96% 97% 95%   Weight:  199 lb 4.7 oz (90.4 kg)  199 lb 4.7 oz (90.4 kg)  Height:       Physical Exam Constitutional: NAD, appears comfortable HEENT: Atraumatic, normocephalic. PERRL, anicteric sclera.  Neck: Supple, trachea midline. Right IJ TDC in place  Cardiovascular:RRR, no murmurs, rubs, or gallops.  Pulmonary/Chest:CTAB, no wheezes, rales, or rhonchi. No chest wall abnormalities.  Abdominal:Soft, non tender, non distended. +BS.  Extremities: Right leg with ACE compression wrap; Swelling significantly improved today. Distal pulses 2+ bilaterally.  Neurological:A&Ox3, CN II - XII grossly intact.  Skin: No rashes or erythema  Psychiatric:Normal mood and affect  Assessment/Plan:  AKA Secondary to Rhabdomyolysis: CK has resolved. S/p right IJ tunneled catheter placed 12/12 per vascular without complication. Last HD session was 12/12. Urine output continues to improve, 1.9L yesterday and 2.8L today. Creatinine continues to rise, up to 8.3 > 8.89 today. Given her significant improvement in urine output we remain hopeful for return of renal function and a subsequent decline in her creatinine. Will hold off on further HD for now per nephrology. Reassess need for HD daily.  -- Nephrology following, appreciate recs -- Strict I/Os, daily weights  -- No HD today, reassess tomorrow  -- No phos binder for now  -- Wean percocet q6hrs prn -> q8hrs today  -- Daily labs  Anemia: Stable and asymptomatic. Hgb  trended down last week 10 >6.6 on 12/12. S/p 1 unit of pRBC with good response in hemoglobin. Repeat CT abdomen pelvis 12/13 showed stable intraperitoneal hematoma. Hemoglobin is 8.5 today. Iron studies ordered due to recent ESA initiation. Iron is low normal. Will start iron supplementations for now. Depending on recovery of her renal function she may not require long term ESA and iron supplements may be temporary.  -- Daily CBC  -- Transfuse prn for Hgb < 7.0 -- ESA per nephrology   -- Iron supplements   Recurrent Right Knee Effusion: S/p arthrocentesis x 2 with a total of 85 cc removed. MRI 12/8 showed a moderate effusion and possible tear of of medial patellofemoral ligament. Discussed with ortho, no need for surgery at this time. Outpatient elective surgery if pain persists. Effusion is significantly improved today.  -- Daily PT -- Cultures no growth x 5 days (final) -- Continue to monitor  -- Wrap with ACE bandage, elevate  R Thigh Pain and Swelling:Improving, no signs of compartment syndrome.  -- ACE compression wrap, and elevation for swelling -- Continue to monitor -- PT/OT  Acute Hepatitis: In the setting of IV drug use and rhabdomyolysis. AST and ALT down 1900 -> 87 and 705 -->161 respectively. Hep C ab positive, viral load elevated at 7,000.Likely multifactorial due to viral hepatitis and shock liver after possible transient drop in BP while using heroin. HIV negative.  -- Avoid hepatotoxic meds -- Outpatient ID referral   Opioid use disorder: Will discuss outpatient Suboxone prior to discharge.  --  HIV negative  FEN: IVF, lasix, and HDper nephro, monitoring electrolytes, regular diet VTE ppx: SCDs Code Status: FULL  Dispo: Anticipated discharge pending improvement in renal function.   Reymundo Pollarolyn Osmani Kersten, MD 02/08/2016, 7:12 AM Pager: [phone]

## 2016-02-08 NOTE — Progress Notes (Signed)
Admit: 01/23/2016 LOS: 16  51F AKI 2/2 Rhabdomyolysis; heroin overdose  Subjective:  Further increased UOP K and HCO3 ok SCR up somewhat further; BUN less so VSS; feels well No new complaints    12/15 0701 - 12/16 0700 In: 960 [P.O.:960] Out: 2800 [Urine:2800]  Filed Weights   02/06/16 2001 02/07/16 2138 02/08/16 0444  Weight: 90.3 kg (199 lb) 90.4 kg (199 lb 4.7 oz) 90.4 kg (199 lb 4.7 oz)    Scheduled Meds: . acetaminophen  1,000 mg Oral BID  . darbepoetin (ARANESP) injection - DIALYSIS  100 mcg Intravenous Q Tue-HD  . diclofenac sodium  2 g Topical QID  . ramelteon  8 mg Oral QHS  . sodium chloride flush  3 mL Intravenous Q12H   Continuous Infusions: PRN Meds:.heparin, methocarbamol, oxyCODONE-acetaminophen, polyethylene glycol, promethazine  Current Labs: reviewed    Physical Exam:  Blood pressure 133/67, pulse 97, temperature 98.3 F (36.8 C), temperature source Oral, resp. rate 20, height 5\' 6"  (1.676 m), weight 90.4 kg (199 lb 4.7 oz), last menstrual period 01/13/2016, SpO2 95 %. NAD RRR CTAB No LEE. RLE Bandaged S/nt/nd R IJ TDC C/D/I  A 1. 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 Rhabdomyolysis 2/2 prolonged immobility 3. Heroin overdose 4. Anemia 5. HCV Infection 6. Hyperphosphatemia  P 1. Daily assessment for HD; not today given inc UOP; further optimism for recovery 2. Daily weights, Daily Renal Panel, Strict I/Os, Avoid nephrotoxins (NSAIDs, judicious IV Contrast)  3. No binder at this time    Alejandra Heckyan Scout Gumbs MD 02/08/2016, 6:53 AM   Recent Labs Lab 02/06/16 0318 02/07/16 0453 02/08/16 0411  NA 135 136 138  K 4.3 4.6 4.6  CL 99* 100* 100*  CO2 26 23 21*  GLUCOSE 83 80 75  BUN 41* 48* 54*  CREATININE 7.42* 8.30* 8.89*  CALCIUM 8.1* 8.3* 8.9  PHOS 6.4* 7.4* 8.1*    Recent Labs Lab 02/06/16 0318 02/07/16 0453 02/08/16 0410  WBC 7.0 7.1 8.1  HGB 8.0* 7.7* 8.5*  HCT 24.0* 23.6* 25.6*  MCV 90.9  91.1 92.1  PLT 293 337 419*

## 2016-02-08 NOTE — Progress Notes (Signed)
PT Cancellation Note  Patient Details Name: Alejandra FerronRebecca D Saran MRN: [id number] DOB: [birth year]   Cancelled Treatment:    Reason Eval/Treat Not Completed: Pain limiting ability to participate.  Patient c/o severe headache.  Patient calling RN.  PT will return today as time allows.   Vena AustriaSusan H Jacilyn Sanpedro 02/08/2016, 12:08 PM Durenda HurtSusan H. Renaldo Fiddleravis, PT, [facility] Acute Rehab Services Pager [phone]

## 2016-02-09 DIAGNOSIS — M6282 Rhabdomyolysis: Secondary | ICD-10-CM

## 2016-02-09 DIAGNOSIS — B1710 Acute hepatitis C without hepatic coma: Secondary | ICD-10-CM

## 2016-02-09 LAB — RENAL FUNCTION PANEL
ANION GAP: 17 — AB (ref 5–15)
Albumin: 2.9 g/dL — ABNORMAL LOW (ref 3.5–5.0)
BUN: 55 mg/dL — ABNORMAL HIGH (ref 6–20)
CALCIUM: 8.9 mg/dL (ref 8.9–10.3)
CO2: 18 mmol/L — AB (ref 22–32)
Chloride: 105 mmol/L (ref 101–111)
Creatinine, Ser: 9.36 mg/dL — ABNORMAL HIGH (ref 0.44–1.00)
GFR calc Af Amer: 6 mL/min — ABNORMAL LOW (ref >60)
GFR calc non Af Amer: 5 mL/min — ABNORMAL LOW (ref >60)
GLUCOSE: 80 mg/dL (ref 65–99)
PHOSPHORUS: 8.4 mg/dL — AB (ref 2.5–4.6)
POTASSIUM: 4.9 mmol/L (ref 3.5–5.1)
Sodium: 140 mmol/L (ref 135–145)

## 2016-02-09 MED ORDER — HYDROCODONE-ACETAMINOPHEN 5-325 MG PO TABS
1.0000 | ORAL_TABLET | Freq: Three times a day (TID) | ORAL | Status: DC | PRN
  Administered 2016-02-09 – 2016-02-10 (×3): 1 via ORAL
  Filled 2016-02-09 (×3): qty 1

## 2016-02-09 NOTE — Progress Notes (Signed)
   Subjective: Patient feels well today. She continues to make good urine, 2.3L since yesterday. Pain and swelling continue to improve. Reports she has been up moving around the room with her walker more since yesterday.   Objective:  Vital signs in last 24 hours: Vitals:   02/08/16 1720 02/08/16 2300 02/09/16 0500 02/09/16 0900  BP: 127/71 117/72  126/73  Pulse: 71 73  72  Resp: 17 17  16   Temp: 99 F (37.2 C) 98.4 F (36.9 C)  98.4 F (36.9 C)  TempSrc: Oral   Oral  SpO2: 98% 98%  97%  Weight:   199 lb 4.7 oz (90.4 kg)   Height:       Physical Exam Constitutional: NAD, appears comfortable HEENT: Atraumatic, normocephalic. PERRL, anicteric sclera.  Neck: Supple, trachea midline. Right IJ TDC in place  Cardiovascular:RRR, no murmurs, rubs, or gallops.  Pulmonary/Chest:CTAB, no wheezes, rales, or rhonchi. No chest wall abnormalities.  Abdominal:Soft, non tender, non distended. +BS.  Extremities: Right leg swelling significantly improved today, landmarks now identifiable. Distal pulses 2+ bilaterally.  Neurological:A&Ox3, CN II - XII grossly intact.  Skin: No rashes or erythema  Psychiatric:Normal mood and affect  Assessment/Plan:  AKA Secondary to Rhabdomyolysis:CK has resolved. S/p right IJ tunneled catheter placed 12/12 per vascular without complication. Last HD session was 12/12. She continues to make good urine, 1.9L > 2.8L > 2.3L today, unfortanely Creatinine continues to rise, 8.3 > 8.8 > 9.3 today. Will follow up with nephrology on their recommendations for HD. -- Nephrology following, appreciate recs -- Strict I/Os, daily weights  -- Weaning opiods: percocet q8hrs -> norco q8hrs prn (tomorrow will plan for norco BID then likely stop) -- Daily labs  Anemia: Stable and asymptomatic. Hgb trended down last week 10 >6.6 on 12/12. S/p 1 unit of pRBC with good response in hemoglobin. Repeat CT abdomen pelvis 12/13 showed stable intraperitoneal hematoma. Hemoglobin has  remained stable. Iron studies ordered due to recent ESA initiation and she was start iron supplementations. Depending on recovery of her renal function she may not require long term ESA and iron supplements may be temporary.  -- Daily CBC  -- Transfuse prn for Hgb < 7.0 -- ESA per nephrology  -- Iron supplements   Recurrent Right Knee Effusion: S/p arthrocentesis x 2 with a total of 85 cc removed. MRI 12/8 showed a moderate effusion and possible tear of of medial patellofemoral ligament. Discussed with ortho, no need for surgery at this time. Outpatient elective surgery if pain persists. Effusion is significantly improved today.  -- Daily PT -- Cultures no growth x 5 days (final) -- Continue to monitor  -- Wrap with ACE bandage, elevate  R Thigh Pain and Swelling:Improving, no signs of compartment syndrome.  -- ACE compression wrap, and elevation for swelling -- Continue to monitor -- PT/OT  Acute Hepatitis: In the setting of IV drug use and rhabdomyolysis. AST and ALT down 1900 -> 87 and 705 -->161 respectively. Hep C ab positive, viral load elevated at 7,000.Likely multifactorial due to viral hepatitis and shock liver after possible transient drop in BP while using heroin. HIV negative.  -- Avoid hepatotoxic meds -- Outpatient ID referral   Opioid use disorder: Will discuss outpatient Suboxone prior to discharge.  -- HIV negative  FEN: IVF, lasix, and HDper nephro, monitoring electrolytes, regular diet VTE ppx: SCDs Code Status: FULL  Dispo: Anticipated discharge pending improvement in renal function.  Alejandra Pollarolyn Giuliana Handyside, MD 02/09/2016, 1:13 PM Pager: [phone]

## 2016-02-09 NOTE — Progress Notes (Addendum)
Admit: 01/23/2016 LOS: 17  20F AKI 2/2 Rhabdomyolysis; heroin overdose  Subjective:  Good UOP Labs this AM pending VSS; feels well No new complaints    12/16 0701 - 12/17 0700 In: 1560 [P.O.:1560] Out: 3125 [Urine:3125]  Filed Weights   02/07/16 2138 02/08/16 0444 02/09/16 0500  Weight: 90.4 kg (199 lb 4.7 oz) 90.4 kg (199 lb 4.7 oz) 90.4 kg (199 lb 4.7 oz)    Scheduled Meds: . acetaminophen  1,000 mg Oral BID  . darbepoetin (ARANESP) injection - DIALYSIS  100 mcg Intravenous Q Tue-HD  . diclofenac sodium  2 g Topical QID  . ferrous sulfate  325 mg Oral Q breakfast  . ramelteon  8 mg Oral QHS  . sodium chloride flush  3 mL Intravenous Q12H   Continuous Infusions: PRN Meds:.heparin, methocarbamol, oxyCODONE-acetaminophen, polyethylene glycol, promethazine  Current Labs: reviewed    Physical Exam:  Blood pressure 117/72, pulse 73, temperature 98.4 F (36.9 C), resp. rate 17, height 5\' 6"  (1.676 m), weight 90.4 kg (199 lb 4.7 oz), last menstrual period 01/13/2016, SpO2 98 %. NAD RRR CTAB No LEE. RLE Bandaged S/nt/nd R IJ TDC C/D/I  A 1. 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 Rhabdomyolysis 2/2 prolonged immobility 3. Heroin overdose 4. Anemia 5. HCV Infection 6. Hyperphosphatemia  P 1. Await labs this AM 2. Once clear pattern of down-trending BUN/SCr ok with DC 3. TDC must be removed prior to DC IF NOT REQUIRING DIALYSIS 4. Daily weights, Daily Renal Panel, Strict I/Os, Avoid nephrotoxins (NSAIDs, judicious IV Contrast)  5. No binder at this time; only if becomes indefinitely dialysis dependent  UPDATE: SCr further increased. No change to plans.  Not yet liberated from HD.  F/u labs in AM.     Sabra Heckyan Sanford MD 02/09/2016, 7:40 AM   Recent Labs Lab 02/06/16 0318 02/07/16 0453 02/08/16 0411  NA 135 136 138  K 4.3 4.6 4.6  CL 99* 100* 100*  CO2 26 23 21*  GLUCOSE 83 80 75  BUN 41* 48* 54*  CREATININE 7.42* 8.30*  8.89*  CALCIUM 8.1* 8.3* 8.9  PHOS 6.4* 7.4* 8.1*    Recent Labs Lab 02/06/16 0318 02/07/16 0453 02/08/16 0410  WBC 7.0 7.1 8.1  HGB 8.0* 7.7* 8.5*  HCT 24.0* 23.6* 25.6*  MCV 90.9 91.1 92.1  PLT 293 337 419*

## 2016-02-10 DIAGNOSIS — K7200 Acute and subacute hepatic failure without coma: Secondary | ICD-10-CM

## 2016-02-10 LAB — RENAL FUNCTION PANEL
Albumin: 2.9 g/dL — ABNORMAL LOW (ref 3.5–5.0)
Anion gap: 18 — ABNORMAL HIGH (ref 5–15)
BUN: 58 mg/dL — ABNORMAL HIGH (ref 6–20)
CALCIUM: 8.8 mg/dL — AB (ref 8.9–10.3)
CHLORIDE: 104 mmol/L (ref 101–111)
CO2: 17 mmol/L — AB (ref 22–32)
CREATININE: 8.86 mg/dL — AB (ref 0.44–1.00)
GFR calc non Af Amer: 5 mL/min — ABNORMAL LOW (ref >60)
GFR, EST AFRICAN AMERICAN: 6 mL/min — AB (ref >60)
GLUCOSE: 85 mg/dL (ref 65–99)
Phosphorus: 8.3 mg/dL — ABNORMAL HIGH (ref 2.5–4.6)
Potassium: 5.1 mmol/L (ref 3.5–5.1)
SODIUM: 139 mmol/L (ref 135–145)

## 2016-02-10 LAB — CBC
HCT: 24.1 % — ABNORMAL LOW (ref 36.0–46.0)
HEMOGLOBIN: 7.7 g/dL — AB (ref 12.0–15.0)
MCH: 30.3 pg (ref 26.0–34.0)
MCHC: 32 g/dL (ref 30.0–36.0)
MCV: 94.9 fL (ref 78.0–100.0)
Platelets: 369 10*3/uL (ref 150–400)
RBC: 2.54 MIL/uL — AB (ref 3.87–5.11)
RDW: 16.2 % — ABNORMAL HIGH (ref 11.5–15.5)
WBC: 7 10*3/uL (ref 4.0–10.5)

## 2016-02-10 MED ORDER — HYDROCODONE-ACETAMINOPHEN 5-325 MG PO TABS
1.0000 | ORAL_TABLET | Freq: Two times a day (BID) | ORAL | Status: DC | PRN
  Administered 2016-02-10: 1 via ORAL
  Filled 2016-02-10: qty 1

## 2016-02-10 NOTE — Progress Notes (Signed)
I was asked by the house staff to see Ms. Maenza about potential treatment options for opiate use disorder with buprenorphine. The patient has had opiate use disorder for about 5 years now. [facility] 18 days with acute renal failure due to rhabdomyolysis after an overdose of heroin. Her OUD started with pain medication for lower back pain that she describes as a slipped disc. This progressed into addiction to pain pills through a pain clinic. She then progressed to snorting heroin and then IV use of heroin, which she did for a few years. She reports being in [facility], ending in June 2017. Her barriers to continuing care there were financial, she was paying out of pocket, daily time commitment, and she didn't like the feeling of being addicted to methadone. After the methadone taper in June, she reported abstinence until she used heroin in November leading to an overdose and her current admission.   She has no history of underlying psychiatric disorders. No history of suicide attempts. She is not an everyday alcohol user but has had a history of binge alcohol use. She has tried Suboxone before, obtaining illicitly. Has never been through a Suboxone treatment program. She is interested in pursuing Suboxone in the future. She recognizes that she needs something to help her cravings for opiates. Currently she lives with her mother-in-law. She has 3 children who live with her former partner and her mother. She recognizes that her health is overall very tenuous and future heroin use could be disastrous.  This patient does have a significant opiate use disorder and pharamacotherapy combined with psychosocial treatment would offer her the best chance at prolonged abstinence. I see the largest barrier being financial in her case. She is currently uninsured and I am not sure if she will qualify for medicaid. She  is worried about the out of pocket expense of the medication and frequent office visits. I will ask [facility] Selena BattenKim with [facility] if we can get her discounted buprenorphine through either [employer] or potentially the pharma company. The patient [facility] for Friday 12/22 at 1:45 pm. I can see her during that visit for follow up on that issue. With orange card support I don't anticipate co-pays will remain a barrier. If all goes well, we could potentially plan for an induction visit on 12/27.

## 2016-02-10 NOTE — Progress Notes (Signed)
[facility] ROUNDING NOTE   Subjective:   Interval History:  Continues with great urine output no dialysis since 12/12   Objective:  Vital signs in last 24 hours:  Temp:  [98.3 F (36.8 C)-99.4 F (37.4 C)] 99 F (37.2 C) (12/18 0933) Pulse Rate:  [55-72] 59 (12/18 0933) Resp:  [14-18] 14 (12/18 0933) BP: (126-127)/(66-92) 126/66 (12/18 0933) SpO2:  [97 %-100 %] 97 % (12/18 0933) Weight:  [85.2 kg (187 lb 12.8 oz)] 85.2 kg (187 lb 12.8 oz) (12/17 2231)  Weight change: -5.214 kg (-11 lb 7.9 oz) Filed Weights   02/08/16 0444 02/09/16 0500 02/09/16 2231  Weight: 90.4 kg (199 lb 4.7 oz) 90.4 kg (199 lb 4.7 oz) 85.2 kg (187 lb 12.8 oz)    Intake/Output: I/O last 3 completed shifts: In: 2640 [P.O.:2640] Out: 2725 [Urine:2725]   Intake/Output this shift:  Total I/O In: 240 [P.O.:240] Out: -   CVS- RRR RS- CTA ABD- BS present soft non-distended EXT- no edema [facility]   Basic Metabolic Panel:  Recent Labs Lab 02/06/16 0318 02/07/16 0453 02/08/16 0411 02/09/16 0710 02/10/16 0537  NA 135 136 138 140 139  K 4.3 4.6 4.6 4.9 5.1  CL 99* 100* 100* 105 104  CO2 26 23 21* 18* 17*  GLUCOSE 83 80 75 80 85  BUN 41* 48* 54* 55* 58*  CREATININE 7.42* 8.30* 8.89* 9.36* 8.86*  CALCIUM 8.1* 8.3* 8.9 8.9 8.8*  PHOS 6.4* 7.4* 8.1* 8.4* 8.3*    Liver Function Tests:  Recent Labs Lab 02/04/16 0554  02/06/16 0318 02/07/16 0453 02/08/16 0411 02/09/16 0710 02/10/16 0537  AST 34  --   --   --   --   --   --   ALT 93*  --   --   --   --   --   --   ALKPHOS 35*  --   --   --   --   --   --   BILITOT 2.1*  --   --   --   --   --   --   PROT 4.7*  --   --   --   --   --   --   ALBUMIN 2.2*  2.3*  < > 2.3* 2.3* 2.6* 2.9* 2.9*  < > = values in this interval not displayed. No results for input(s): LIPASE, AMYLASE in the last 168 hours. No results for input(s): AMMONIA in the last 168 hours.  CBC:  Recent Labs Lab 02/05/16 1001 02/06/16 0318  02/07/16 0453 02/08/16 0410 02/10/16 0537  WBC 8.0  7.9 7.0 7.1 8.1 7.0  HGB 7.7*  7.7* 8.0* 7.7* 8.5* 7.7*  HCT 22.9*  22.5* 24.0* 23.6* 25.6* 24.1*  MCV 90.2  90.4 90.9 91.1 92.1 94.9  PLT 274  273 293 337 419* 369    Cardiac Enzymes: No results for input(s): CKTOTAL, CKMB, CKMBINDEX, TROPONINI in the last 168 hours.  BNP: Invalid input(s): POCBNP  CBG: No results for input(s): GLUCAP in the last 168 hours.  Microbiology: Results for orders placed or performed during the hospital encounter of 01/23/16  Urine culture     Status: Abnormal   Collection Time: 01/23/16  5:08 AM  Result Value Ref Range Status   Specimen Description URINE, RANDOM  Final   Special Requests NONE  Final   Culture <10,000 COLONIES/mL INSIGNIFICANT GROWTH (A)  Final   Report Status 01/24/2016 FINAL  Final  MRSA PCR  Screening     Status: Abnormal   Collection Time: 01/23/16  6:23 PM  Result Value Ref Range Status   MRSA by PCR POSITIVE (A) NEGATIVE Final    Comment:        The GeneXpert MRSA Assay (FDA approved for NASAL specimens only), is one component of a comprehensive MRSA colonization surveillance program. It is not intended to diagnose MRSA infection nor to guide or monitor treatment for MRSA infections. CRITICAL RESULT CALLED TO, READ BACK BY AND VERIFIED WITH: C. WOODARD, RN AT 2237 ON 01/23/16 BY C. JESSUP, MLT.   Culture, body fluid-bottle     Status: None   Collection Time: 01/29/16  5:03 PM  Result Value Ref Range Status   Specimen Description SYNOVIAL RIGHT KNEE  Final   Special Requests BOTTLES DRAWN AEROBIC AND ANAEROBIC 10CC  Final   Culture NO GROWTH 5 DAYS  Final   Report Status 02/03/2016 FINAL  Final  Gram stain     Status: None   Collection Time: 01/29/16  5:03 PM  Result Value Ref Range Status   Specimen Description SYNOVIAL RIGHT KNEE  Final   Special Requests NONE  Final   Gram Stain   Final    RARE WBC PRESENT, PREDOMINANTLY PMN NO ORGANISMS SEEN     Report Status 01/29/2016 FINAL  Final  Stat Gram stain     Status: None   Collection Time: 01/31/16  2:27 PM  Result Value Ref Range Status   Specimen Description SYNOVIAL RIGHT KNEE  Final   Special Requests NONE  Final   Gram Stain   Final    RARE WBC PRESENT, PREDOMINANTLY MONONUCLEAR NO ORGANISMS SEEN    Report Status 01/31/2016 FINAL  Final  Culture, blood (Routine X 2) w Reflex to ID Panel     Status: None   Collection Time: 02/01/16  5:39 PM  Result Value Ref Range Status   Specimen Description BLOOD HEMODIALYSIS CATHETER  Final   Special Requests BOTTLES DRAWN AEROBIC AND ANAEROBIC 10CC  Final   Culture NO GROWTH 5 DAYS  Final   Report Status 02/06/2016 FINAL  Final  Culture, blood (Routine X 2) w Reflex to ID Panel     Status: None   Collection Time: 02/01/16  6:05 PM  Result Value Ref Range Status   Specimen Description BLOOD HEMODIALYSIS CATHETER  Final   Special Requests BOTTLES DRAWN AEROBIC AND ANAEROBIC 10CC  Final   Culture NO GROWTH 5 DAYS  Final   Report Status 02/06/2016 FINAL  Final    Coagulation Studies: No results for input(s): LABPROT, INR in the last 72 hours.  Urinalysis: No results for input(s): COLORURINE, LABSPEC, PHURINE, GLUCOSEU, HGBUR, BILIRUBINUR, KETONESUR, PROTEINUR, UROBILINOGEN, NITRITE, LEUKOCYTESUR in the last 72 hours.  Invalid input(s): APPERANCEUR    Imaging: No results found.   Medications:    . acetaminophen  1,000 mg Oral BID  . darbepoetin (ARANESP) injection - DIALYSIS  100 mcg Intravenous Q Tue-HD  . diclofenac sodium  2 g Topical QID  . ferrous sulfate  325 mg Oral Q breakfast  . ramelteon  8 mg Oral QHS  . sodium chloride flush  3 mL Intravenous Q12H   heparin, HYDROcodone-acetaminophen, methocarbamol, polyethylene glycol, promethazine  Assessment/ Plan:  1. 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 Rhabdomyolysis 2/2 prolonged immobility 3. Heroin overdose 4. Anemia 5. HCV  Infection 6. Hyperphosphatemia  P 1.  Labs improved  2.  clear pattern of down-trending BUN/SCr ok with DC  today  3. TDC must be removed prior to DC    UPDATE: SCr further increased. No change to plans. iberated from HD :))) . May follow up with [facility] labs this week    PLEASE D/C  Catheter ---- will sign off today   LOS: 18 Yana Schorr W @TODAY @9 :45 AM

## 2016-02-10 NOTE — Progress Notes (Signed)
Patient Discharge: Disposition: Patient discharged to home. Education: Reviewed all her follow-up appointments, discharge instructions, and medications, verbalized understanding. IV: N/A Telemetry: N/A Transportation: patient escorted out of the unit in w/c till ride. Belongings: Patient took all her belongings with her.

## 2016-02-10 NOTE — Care Management Note (Addendum)
Case Management Note  Patient Details  Name: Alejandra Park MRN: [id number] Date of Birth: 1985/07/26  Subjective/Objective:         CM following for progression and d/c planning..            Action/Plan: 02/10/2016 as of last week 02/06/2016 this pt no longer qualifies for CIR, she is ambulatory and able to bathe herself.  Pt is self pay, HH needs unclear as she is ambulatory and has no RN skilled needs identified at this time. No medication needs at this time.  Expected Discharge Date:    unknown           Expected Discharge Plan:  Home  In-House Referral:  Clinical Social Work  Discharge planning Services  CM Consult  Post Acute Care Choice:  NA Choice offered to:  NA  DME Arranged:  Rolling Walker [facility]  HH Arranged:  NA HH Agency:  NA  Status of Service:  Complete  If discussed at [employer] Meetings, dates discussed:    Additional Comments:  Starlyn SkeansRoyal, Charlottie Peragine U, RN 02/10/2016, 11:17 AM

## 2016-02-10 NOTE — Progress Notes (Signed)
Subjective:  Patient continues to feel improved. Has no urine output documented in chart, only 5 unmeasured occurences. However, patient is reporting that she is urinating every 2-3 hours and they have not been measuring it because she is able to go on her own. Has right knee pain after walking but otherwise no complaints.   Discussed again today avoiding any further heroin use and discussed options at discharge. She was interested in buprenorphine.   Patient requesting to leave the floor to walk around the hospital for a change in scenery.   Objective:  Vital signs in last 24 hours: Vitals:   02/09/16 0900 02/09/16 1541 02/09/16 2231 02/10/16 0531  BP: 126/73 126/74 127/78 (!) 127/92  Pulse: 72 72 61 (!) 55  Resp: 16 18 16 16   Temp: 98.4 F (36.9 C) 98.4 F (36.9 C) 99.4 F (37.4 C) 98.3 F (36.8 C)  TempSrc: Oral Oral Oral Oral  SpO2: 97% 99% 97% 100%  Weight:   187 lb 12.8 oz (85.2 kg)   Height:       Physical Exam Constitutional: NAD, appears comfortable Neck: Supple, trachea midline. Right IJ TDC in place  Cardiovascular:RRR, no murmurs, rubs, or gallops.  Pulmonary/Chest:CTAB, no wheezes, rales, or rhonchi. Abdominal:Soft, non tender, non distended. +BS.  Extremities: Right leg swelling significantly improved. Distal pulses 2+ bilaterally.   Assessment/Plan:  AKA Secondary to Rhabdomyolysis: CK has resolved. S/p right IJ tunneled catheter placed 12/12 per vascular without complication. Last HD session was 12/12. Reports good urine output (not well documented). Creatinine finally peaked at 9.36 yesterday > 8.86 today though BUN continues to rise. Her anasarca has improved significantly. Remain optimistic that she will require no further HD sessions. She currently has a right tunneled HD catheter in place. Will need to have vascular remove it prior to discharge.  -- Nephrology following, appreciate recs -- Strict I/Os, daily weights  -- Weaning opiods: norco 5-325  q12hrs prn -- Daily labs  Anemia:  Stable and asymptomatic. Hgb trended down last week 10 >6.6 on 12/12. S/p 1 unit of pRBC with good response in hemoglobin. Repeat CT abdomen pelvis 12/13 showed stable intraperitoneal hematoma. Hemoglobin has remained stable at 7.7 today. Fe studies not consistent with Fe deficiency anemia.  -- Daily CBC  -- Transfuse prn for Hgb < 7.0 -- Iron supplements for now, will not continue at discharge  -- Follow up imaging in 1-2 months to assess resolution of intraperitoneal hematoma  Recurrent Right Knee Effusion:  S/p arthrocentesis x 2 with a total of 85 cc removed last week. MRI 12/8 showed a moderate effusion and possible tear of of medial patellofemoral ligament. Discussed with ortho, no need for surgery at this time. Outpatient elective surgery if pain persists. Effusion continues to improve.  -- Daily PT -- Cultures no growth x 5 days (final) -- Continue to monitor  -- Wrap with ACE bandage, elevate  Hepatitis C with transaminitis:  In the setting of IV drug use and rhabdomyolysis. AST and ALT down 1900 -> 87 and 705 -->161 respectively. Hep C ab positive, viral load elevated at 7,000.Likely shock liver initially that has resolved. HIV negative.  -- Avoid hepatotoxic meds -- Outpatient ID referral   Opioid use disorder:  Interested in Buprenorphine after discharge. Will attempt to arrange prior to discharge.  -- HIV negative  FEN: monitoring electrolytes, regular diet VTE ppx: SCDs Code Status: FULL  Dispo: Anticipated discharge pending improvement in renal function in 1-2 days  Valentino NoseNathan Tanmay Halteman, MD 02/10/2016,  9:02 AM Pager: [phone]

## 2016-02-10 NOTE — Progress Notes (Signed)
H&P    CC:  Catheter removal   HPI:  This is a 30 y.o. female who need her tunneled dialysis catheter removed.  She presented to the hospital secondary to a fall and loss of consciousness during heroin use. She developed acute kidney injury secondary to rhabdomyolysis. She had a temporary dialysis catheter placed on 01/25/16 that was removed yesterday. She is unaware of any kidney issues prior to this admission.   Other medical issues include opioid abuse, polysubstance abuse, chronic back pain.   [facility] by Dr. Imogene Burnhen.  Since then, her creatinine has improved and she is making urine.  She is no longer needing dialysis and we are asked to removed her tunneled dialysis catheter.    Past Medical History:  Diagnosis Date  . Bulging disc   . Chronic back pain   . Drug abuse, opioid type   . Fracture of sternum   . History of suicidal ideation 05/2011  . Polysubstance abuse   . Umbilical hernia     FH:  Non-Contributory  Social History   Social History  . Marital status: Single    Spouse name: N/A  . Number of children: N/A  . Years of education: N/A   Occupational History  . Not on file.   Social History Main Topics  . Smoking status: Current Every Day Smoker  . Smokeless tobacco: Current User  . Alcohol use Yes  . Drug use:     Types: IV     Comment: Percocet and Heroin  . Sexual activity: Not on file   Other Topics Concern  . Not on file   Social History Narrative  . No narrative on file    No Known Allergies  Current Facility-Administered Medications  Medication Dose Route Frequency Provider Last Rate Last Dose  . acetaminophen (TYLENOL) tablet 1,000 mg  1,000 mg Oral BID Eulah PontNina Blum, MD   1,000 mg at 02/09/16 1828  . Darbepoetin Alfa (ARANESP) injection 100 mcg  100 mcg Intravenous Q Tue-HD Arita Missyan B Sanford, MD   100 mcg at 02/04/16 2217  . diclofenac sodium (VOLTAREN) 1 % transdermal gel 2 g  2 g Topical QID Alexa Lucrezia Starch Burns, MD   2 g at  02/01/16 0942  . ferrous sulfate tablet 325 mg  325 mg Oral Q breakfast Reymundo Pollarolyn Guilloud, MD   325 mg at 02/10/16 0859  . heparin injection 1,600 Units  20 Units/kg Dialysis PRN Terrial RhodesJoseph Coladonato, MD      . HYDROcodone-acetaminophen (NORCO/VICODIN) 5-325 MG per tablet 1 tablet  1 tablet Oral Q12H PRN Valentino NoseNathan Boswell, MD      . methocarbamol (ROBAXIN) tablet 1,500 mg  1,500 mg Oral Q6H PRN Reymundo Pollarolyn Guilloud, MD   1,500 mg at 02/10/16 0559  . polyethylene glycol (MIRALAX / GLYCOLAX) packet 17 g  17 g Oral BID PRN Reymundo Pollarolyn Guilloud, MD   17 g at 02/09/16 2150  . promethazine (PHENERGAN) tablet 6.25 mg  6.25 mg Oral Q6H PRN Doneen PoissonLawrence Klima, MD      . ramelteon (ROZEREM) tablet 8 mg  8 mg Oral QHS Eulah PontNina Blum, MD   8 mg at 02/09/16 2150  . sodium chloride flush (NS) 0.9 % injection 3 mL  3 mL Intravenous Q12H Lora PaulaJennifer T Krall, MD   3 mL at 02/07/16 1000    ROS:  See HPI  PHYSICAL EXAM  Vitals:   02/10/16 0531 02/10/16 0933  BP: (!) 127/92 126/66  Pulse: (!) 55 (!) 59  Resp: 16 14  Temp: 98.3 F (36.8 C) 99 F (37.2 C)    Gen:  Well developed well nourished HEENT:  normocephalic Neck:  Right IJ TDC in place Lungs:  Non-labored Skin:  No obvious rashes Neuro:  In tact  Lab/X-ray:  Impression: This is a 30 y.o. female here for diatek catheter removal  Plan:  Removal of right diatek catheter   Doreatha MassedSamantha Floretta Petro, PA-C [facility] [phone] 02/10/2016 10:41 AM

## 2016-02-10 NOTE — Progress Notes (Signed)
Latest PT note indicates pt. Ambulating at min guard assist level 200".  Do not anticipate she will have need for CIR. Please call if questions.  Weldon PickingSusan Shelbey Spindler PT Inpatient Rehab Admissions Coordinator Cell [phone] Office [phone]

## 2016-02-10 NOTE — Progress Notes (Signed)
  Catheter Removal Procedure Note    Diagnosis: ESRD  Plan:  Remove right diatek catheter  Consent signed:  Yes.   Time out completed:  Yes.   Coumadin:  No. PT/INR (if applicable):   Other labs:  Procedure: 1.  Sterile prepping and draping over catheter area 2. 0 ml 2% lidocaine plain instilled at removal site. 3.  right catheter removed in its entirety with cuff in tact. 4.  Complications:  none 5. Tip of catheter sent for culture:  No.   Patient tolerated procedure well:  Yes.   Pressure held, no bleeding noted, dressing applied Instructions given to the pt regarding wound care and bleeding.  OtherDoreatha Massed:  Cyndee Giammarco, PA-C 02/10/2016 11:24 AM [phone]

## 2016-02-10 NOTE — Discharge Summary (Signed)
Name: Alejandra Park MRN: [id number] DOB: [birth year] 30 y.o. PCP: No Pcp Per Patient  Date of Admission: 01/23/2016  5:33 AM Date of Discharge: 02/10/2016 Attending Physician: Doneen Poisson, MD  Discharge Diagnosis: 1. Rhabdomyolysis  2. Acute Renal Failure  3. Shock liver 4. Hepatitis C 5. Heroin use   Discharge Medications: Allergies as of 02/10/2016   No Known Allergies     Medication List    You have not been prescribed any medications.     Disposition and follow-up:   Alejandra Park was discharged from [facility] in Stable condition.  At the hospital follow up visit please address:  1.  Acute Renal Failure: Secondary to traumatic rhabdomyolysis requiring temporary HD. Please ensure patient has follow up scheduled with [facility]. UOP improved to 2-3L a day and creatinine peaked at 9.3 , down to 8.8 on discharge. Please continue to monitor renal function closely.   2. Anemia: Requiring 1 unit pRBC. Secondary due acute renal failure and intraperitoneal hematoma from her fall (stable on repeat imaging). Patient received temporary ESA. Please recheck CBC.   2. Hepatitis C: Tested positive this admission. Will need ID referral for treatment.   3. Substance Abuse: Presented after heroin overdose. Discussed suboxone therapy with Dr. Oswaldo Done. Please see his note from 02/10/2016.   4. Recurrent Knee Effusion: S/p arthrocentesis x 2 with a total of 75 cc synovial fluid removed. Fluid was negative for infection and crystals. MRI with partial tear of her medial patellofemoral ligament. May need ortho referral if she continues to have pain and swelling. Discussed with ortho - surgery would be an elective outpatient procedure.   5.  Labs / imaging needed at time of follow-up: Renal function panel, CBC   6.  Pending labs/ test needing follow-up: None   Follow-up Appointments: Follow-up Information     [facility]. Schedule an appointment as soon  as possible for a visit.   Why:  Please call and make a follow up appointment for 1 week.  Contact information: [address] [place] [address] [phone]        [facility]. Go on 02/14/2016.   Why:  at 1:45pm for hospital follow up. Please arrive 15 minutes prior to your appointment for check in. The office is located on the ground floor of the hospital in the east wing, past the cafeteria.  Contact information: [address] [place] [address] [phone]          Hospital Course by problem list:  1. Acute Renal Failure: Secondary to rhabdomyolysis. Patient is a 30 yo F who presented after a fall during a heroin overdose. Patient had previously quit heroin for 6 months but used again prior to admission. She says she was sitting on the bed and does not remember losing consciousness. She fell and was on the ground for about 6 hours before her roommate found her and put her to bed. She was in bed for another 6 hours before she woke up. When she regained consciousness she was in severe pain and couldn't walk. She had bilateral paresthesias and ringing in her ears. On presentation to the ED she was tachycardic 119 and hypotensive 85/51. Potassium was 6.2 and creatinine / BUN were elevated 2.26 / 29 respectively. CK was > 50,000 and UA showed large hematuria. EKG showed no abnormalities. She was aggressively rehydrated and hyperkalemia was treated with calcium, novolog and D50. She received sodium bicarb for her lactic  acidosis. Patient received ~11 L of IVFs over the first 24 hours on admission, unfortunately CK persisted > 50,000 and renal function continued to decline. Patient became oliguric and nephrology was consulted. Fluids were stopped as she was severely fluid overloaded at this point and there was concern for compartment syndrome. She had diffuse soft tissue swelling in her right thigh, left buttocks, and left cheek. Ortho was consulted to  evaluate her right leg. She was endorsing paresthesias but pulses remained intact and lower extremity was well perfused. She eventually became anuric and the decision was made to pursue HD on day 3 of hospitalization. She received serial HD for 4 sessions with all 11L removed. CK slowly began to down trend with dialysis. Soft tissue swelling in her leg improved and there was no need for urgent fasciotomy. Unfortunately, despite improvement in CK and soft tissue swelling, renal function remained poor with very little UOP and persistently elevated creatinine. Her temporary catheter was eventually pulled and a tunneled HD cath was placed per vascular with plans for further HD. She received her final HD session on 12/12. UOP slowly began to return, with 500 cc on 12/13, 825 cc on 12/14, 1.9 L on 12/15, and 2.8L on 12/16. Creatine remained elevated, and actually began to up trend after her last dialysis session despite her improvement in UOP (6.6 -> 7.4 -> 8.3 -> 9.3). Creatinine finally peaked at 9.3 on 12/18 and came down to 8.8 the day of discharged. She continued to maintain good UOP and overall symptoms were much improved. CK had resolved. Tunneled catheter was pulled prior to discharge. She was sent home with plans for outpatient follow up later this week.   2. Acute Hepatitis: due to shock liver. Also with underlying hepatitis C infection. AST and ALT were elevated 1900 and 705 respectively on admission, likely due to hypotension and during her overdose. Hep C antibodies were also positive with a viral load of 7,000. LTFs improved with HD, down to 34 (AST) and 93 (ALT) on last hepatic function panel check. Patient was aware of her hepatitis C diagnosis but had not yet sought treatment. She will need referral to ID.   3. Anemia: During the second week of her hospitalization, patient's hemoglobin began to slowly downtrend from 10.3 >> 6.6 on 12/12. She had a prior CT abdomen and pelvis that revealed a 5 X 5.8 cm  hematoma in the peritoneal cavity superior to her uterus. She was complaining of swelling and fullness in her abdomen with a feeling like she was pregnant. She remained hemodynamically stable and was transfused 1 unit pRBC. Repeat CT after requiring transfusion revealed a stable hematoma, unchanged in size. There was no other source of bleeding identified. FOBT was negative. Nephrology started temporary ESA. Please recheck CBC at follow up.   4. Recurrent Right Knee Effusion: Requiring arthrocentesis x 2 with a total of 75 cc removed. Synovial fluid analysis and culture was negative for infection and crystal. MRI showed effusion and possible tear of of medial patellofemoral ligament. Discussed with ortho who felt there was no need for surgery at that. Recommended outpatient elective surgery if pain persists.   5. Opioid Use Disorder: History of heroin use. Previously quit for 6 months but presented after an over dose. HIV negative. Due to her severe hepatic and renal dysfunction on admission, pain management options were very limit. Patient was in extreme pain with diffuse swelling from the rhabdo and soft tissue injuries from her fall. She was initially treated  with renally dosed tramadol. One hepatic function began to improve, options were discussed with pharmacy and she was switched to low dose oxy (percocet). She was slower tapered off before discharge. Patient expressed an interest in pursuing suboxone therapy and this was discussed with Dr. Oswaldo DoneVincent prior to discharge. Plan to follow up with Dr. Oswaldo DoneVincent.   6. Aspiration Pneumonitis: CXR on admission showed extensive infiltrate in the left upper and lower lobes concerning for PNA. However patient was afebrile with only a mild cough. The decision was made to observe off antibiotics as she did not clinically appears to have a PNA. She improved without antibiotics and the infiltrate was felt to be more consistent with aspiration with her recent loss of  consciousness.   Discharge Vitals:   BP 126/66 (BP Location: Left Arm)   Pulse (!) 59   Temp 99 F (37.2 C) (Oral)   Resp 14   Ht 5\' 6"  (1.676 m)   Wt 187 lb 12.8 oz (85.2 kg)   LMP 01/13/2016   SpO2 97%   BMI 30.31 kg/m   Pertinent Labs, Studies, and Procedures:   02/05/2016 CT Abdomen Pelvis Wo Contrast: IMPRESSION: No significant interval change in the size of the intraperitoneal hematoma in the anterior pelvis.  Increase in the size of bilateral pleural effusions as well as bilateral lower lobe consolidative changes compared to the prior CT.  Small ascites, diffuse mesenteric edema, and anasarca.  Asymmetric enlargement of the left gluteal musculature similar to prior CT and likely related to recent trauma.  Small nonobstructing left renal calculi.  02/02/2016 CT Abdomen Pelvis Wo Contrast: IMPRESSION: Small bilateral pleural effusions.  Changes consistent with a hematoma anteriorly within the peritoneal cavity as described. This could account for the recent hemoglobin drop.  Mild ascites and changes of anasarca.  Diffusely enlarged left gluteal muscles likely related to the recent injury.  Tiny nonobstructing left renal stone.  01/31/2016 MRI Right Femur: IMPRESSION: 1. Prominent abnormal edema in the anterior compartment of the right thigh involving the quadriceps musculature, along with low-level edema signal in the short head of the biceps femoris. Appearance compatible with myositis and infection is not excluded but I do not see an abscess. Right anterior compartment syndrome not excluded by MRI, consider compartmental manometry or other clinical assessment for compartment syndrome. 2. Lesser but still abnormal degree of abnormal muscular edema in the left gluteus maximus, left hip adductor musculature, and left vastus lateralis. 3. Considerable subcutaneous edema in the thighs, especially anteriorly, cellulitis is not excluded. 4. The pelvis  and hips are obscured by boundary artifact. 5. Knee MRI reported separately. These results will be called to the ordering clinician or representative by the Radiologist Assistant, and communication documented in the PACS or zVision Dashboard.  01/31/2016 MRI Right Knee: IMPRESSION: 1. Moderate knee effusion with extensive subcutaneous edema around the knee, extending down into the calf. 2. Distal quadriceps muscular edema. Indistinct femoral attachment of the medial patellofemoral ligament which may be partially torn. 3. No acute bony findings. 4. Minimal patellar chondromalacia. 5. Infiltrative edema in the popliteal fossa  01/28/2016 CT Maxillofacial Wo Contrast: IMPRESSION: Asymmetric soft tissue induration overlying a thickened appearance of the left masseter muscle which may correspond with patient's history of fall. This may be related to posttraumatic soft tissue Swelling.  01/27/2016 DG Right Femur: IMPRESSION: No acute osseous abnormality nor bone destruction. Joint spaces are maintained. Mild scattered soft tissue induration of the right Thigh. No acute osseous abnormality.  01/24/2016 Right Lower Extremity  Ultrasound: IMPRESSION: Soft tissue injury and edema with no focal hematoma or drainable fluid collection identified.  01/23/2016 DG Chest Port 1 View: IMPRESSION: Extensive infiltrate in the left upper lobe and left lower lobe most consistent with pneumonia.  Discharge Instructions: Discharge Instructions    Call MD for:  extreme fatigue    Complete by:  As directed    Call MD for:  persistant dizziness or light-headedness    Complete by:  As directed    Call MD for:  persistant nausea and vomiting    Complete by:  As directed    Call MD for:  severe uncontrolled pain    Complete by:  As directed    Diet - low sodium heart healthy    Complete by:  As directed    Discharge instructions    Complete by:  As directed    You are scheduled for follow up in  our clinic this Friday the 22nd at 1:45pm. Please keep this appointment so that we can recheck your kidney labs. You will need to call [facility] and schedule a follow up appointment in the next week. It is important that you abstain from heroin at this point. Your kidneys are still recovering and you had a very close call! We will try and set you up for Suboxone in our clinic with Dr. Oswaldo Done. It was a pleasure taking care of you. If you have any questions or concerns, call our clinic at [phone] or after hours call [phone] and ask for the internal medicine resident on call.   Increase activity slowly    Complete by:  As directed       Signed: Reymundo Poll, MD 02/14/2016, 9:42 AM   Pager: [phone]

## 2016-02-13 ENCOUNTER — Telehealth: Payer: Self-pay | Admitting: General Practice

## 2016-02-13 NOTE — Telephone Encounter (Signed)
APT. REMINDER CALL, NO ANSWER, NO VOICCEMAIL

## 2016-02-14 ENCOUNTER — Encounter: Payer: Self-pay | Admitting: Internal Medicine

## 2016-02-14 ENCOUNTER — Ambulatory Visit (INDEPENDENT_AMBULATORY_CARE_PROVIDER_SITE_OTHER): Payer: Self-pay | Admitting: Internal Medicine

## 2016-02-14 VITALS — BP 127/67 | HR 80 | Temp 98.5°F | Ht 67.0 in | Wt 157.3 lb

## 2016-02-14 DIAGNOSIS — N179 Acute kidney failure, unspecified: Secondary | ICD-10-CM

## 2016-02-14 DIAGNOSIS — T796XXD Traumatic ischemia of muscle, subsequent encounter: Secondary | ICD-10-CM

## 2016-02-14 DIAGNOSIS — Z5189 Encounter for other specified aftercare: Secondary | ICD-10-CM

## 2016-02-14 DIAGNOSIS — B1710 Acute hepatitis C without hepatic coma: Secondary | ICD-10-CM

## 2016-02-14 DIAGNOSIS — F1129 Opioid dependence with unspecified opioid-induced disorder: Secondary | ICD-10-CM

## 2016-02-14 DIAGNOSIS — B182 Chronic viral hepatitis C: Secondary | ICD-10-CM

## 2016-02-14 DIAGNOSIS — T401X1D Poisoning by heroin, accidental (unintentional), subsequent encounter: Secondary | ICD-10-CM

## 2016-02-14 DIAGNOSIS — F1920 Other psychoactive substance dependence, uncomplicated: Secondary | ICD-10-CM

## 2016-02-14 DIAGNOSIS — D649 Anemia, unspecified: Secondary | ICD-10-CM

## 2016-02-14 DIAGNOSIS — N170 Acute kidney failure with tubular necrosis: Secondary | ICD-10-CM

## 2016-02-14 LAB — POCT URINE PREGNANCY: PREG TEST UR: NEGATIVE

## 2016-02-14 MED ORDER — BUPRENORPHINE HCL-NALOXONE HCL 8-2 MG SL FILM: 1.0000 | ORAL_FILM | Freq: Every day | SUBLINGUAL | 0 refills | Status: DC

## 2016-02-14 NOTE — Assessment & Plan Note (Addendum)
Patient admitted from 11/30-12/18 for ARF 2/2 traumatic rhabdomyolysis requiring temporary HD after becoming anuric despite aggressive IVF resuscitation. Creatinine peaked at 9.3 and was 8.8 on discharge. She reports good urine output since discharge and slowly improving RLE muscle soreness. She ambulates with a cane and feels like this is slowly improving as well. She is not taking any medication other than Tylenol.  Patient reportedly diuresing well without medication assistance. Weight has come down 30 lbs and she only has minimal ankle swelling when walking for an hour or more. RLE muscle pain is improving. Will check labs for renal function today. She is encouraged to follow up with [facility] as well.

## 2016-02-14 NOTE — Assessment & Plan Note (Signed)
Patient with history of opioid dependence beginning with prescription narcotics for her low back pain. She became addicted and eventually progressed to IV heroin use. She was previously treated with Methadone which she discontinued due to financial constraints and time commitment. She was reportedly clean for 6 months until relapse and overdose which led to her most recent hospital admission. Patient is interested in Suboxone treatment to reduce risk of relapse. Patient initially tells me that she has not used heroin since her discharge, however after collecting urine she does admit that she "used a little heroin" on Tuesday (02/11/2016) due to continued right knee pain.   Patient at high risk for continued heroin use and is a good candidate for Suboxone treatment. Dr. Oswaldo DoneVincent discussed the risks and benefits of suboxone treatment. A pain contract was explained and established with patient. Patient prefers to try Suboxone films, rather than tablets.  -Will start patient on Suboxone 8-2 mg once daily #6 films -f/u on 02/19/16 to assess patient and adjust suboxone as needed -If cost of films are too high, will switch to tablets -Urine pregnancy test is negative -Urine tox screen obtained today -Pain contract established and will be scanned into our system; copy given to patient

## 2016-02-14 NOTE — Patient Instructions (Addendum)
It was a pleasure to meet you Alejandra Park.  I am glad you are feeling better.  We are checking labs to see how your kidney function is today.  Please call the [facility] offices to schedule an appointment.  Please continue to work on obtaining the [employer].  We will start the Suboxone films to help reduce cravings. You can use 1 film per day.   We have provided you with a copy of the pain contract we discussed.  Please follow up with us on 02/19/16.   Buprenorphine buccal film What is this medicine? BUPRENORPHINE (byoo pre NOR feen) is a pain reliever. It is used to treat moderate to severe pain. COMMON BRAND NAME(S): Belbuca What should I tell my health care provider before I take this medicine? They need to know if you have any of these conditions: -blockage in your bowel -brain tumor -drink more than 3 alcohol-containing drinks per day -drug abuse or addiction -head injury -kidney disease -liver disease -lung or breathing disease, like asthma -mouth sores -thyroid disease -trouble passing urine or change in the amount of urine -an unusual or allergic reaction to buprenorphine, other medicines, foods, dyes, or preservatives -pregnant or trying to get pregnant -breast-feeding How should I use this medicine? Take this medicine by mouth. Follow the directions on the prescription label. Wet the inside of your cheek with tongue or rinse mouth with water before using this medicine. Open package with dry hands just before you are ready to use. Do not cut or tear the film. Use the tip of a dry finger to put film in the mouth with the yellow side of the film facing the cheek. Hold the film in place for 5 seconds. After you place the medicine on your cheek leave the film in place until it dissolves away in about 30 minutes. Do not move or touch the film with fingers or tongue. Do not eat or drink until the film has dissolved. Do not chew or swallow the film. Take your medicine at  regular intervals. Do not take your medicine more often than directed. Do not stop taking except on your doctor's advice. A special MedGuide will be given to you by the pharmacist with each prescription and refill. Be sure to read this information carefully each time. Talk to your pediatrician regarding the use of this medicine in children. Special care may be needed. What if I miss a dose? If you miss a dose, take it as soon as you can. If it is almost time for your next dose, take only that dose. Do not take double or extra doses. What may interact with this medicine? Do not take this medication with any of the following medicines: -cisapride -certain medicines for fungal infections like ketoconazole and itraconazole -dofetilide -dronedarone -pimozide -ritonavir -thioridazine -ziprasidone This medicine may interact with the following medications: -alcohol -antihistamines for allergy, cough and cold -antiviral medicines for HIV or AIDS -atropine -certain antibiotics like clarithromycin, erythromycin, linezolid, rifampin -certain medicines for anxiety or sleep -certain medicines for bladder problems like oxybutynin, tolterodine -certain medicines for depression like amitriptyline, fluoxetine, sertraline -certain medicines for migraine headache like almotriptan, eletriptan, frovatriptan, naratriptan, rizatriptan, sumatriptan, zolmitriptan -certain medicines for nausea or vomiting like dolasetron, ondansetron, palonosetron -certain medicines for Parkinson's disease like benztropine, trihexyphenidyl -certain medicines for seizures like phenobarbital, primidone -certain medicines for stomach problems like cimetidine, dicyclomine, hyoscyamine -certain medicines for travel sickness like scopolamine -diuretics -general anesthetics like halothane, isoflurane, methoxyflurane, propofol -ipratropium -local anesthetics like  lidocaine, pramoxine, tetracaine -MAOIs like Carbex, Eldepryl, Marplan,  Nardil, and Parnate -medicines that relax muscles for surgery -methylene blue -other medicines that prolong the QT interval (cause an abnormal heart rhythm) -other narcotic medicines for pain or cough -phenothiazines like chlorpromazine, mesoridazine, prochlorperazine, thioridazine What should I watch for while using this medicine? Tell your doctor or health care professional if your pain does not go away, if it gets worse, or if you have a new or a different type of pain. You may develop tolerance to the medicine. Tolerance means that you will need a higher dose of the medicine for pain relief. Tolerance is normal and is expected if you take the medicine for a long time. Do not suddenly stop taking your medicine because you may develop a severe reaction. Your body becomes used to the medicine. This does NOT mean you are addicted. Addiction is a behavior related to getting and using a drug for a non-medical reason. If you have pain, you have a medical reason to take pain medicine. Your doctor will tell you how much medicine to take. If your doctor wants you to stop the medicine, the dose will be slowly lowered over time to avoid any side effects. If you are also taking a narcotic medicine for pain or cough or another medicine that also causes drowsiness, you may have more side effects. Give your health care provider a list of all medicines you use. Your doctor will tell you how much medicine to take. Do not take more medicine than directed. Call emergency for help if you have problems breathing or unusual sleepiness. You may get drowsy or dizzy. Do not drive, use machinery, or do anything that needs mental alertness until you know how this medicine affects you. Do not stand or sit up quickly, especially if you are an older patient. This reduces the risk of dizzy or fainting spells. Alcohol may interfere with the effect of this medicine. Avoid alcoholic drinks. The medicine will cause constipation. Try to  have a bowel movement at least every 2 to 3 days. If you do not have a bowel movement for 3 days, call your doctor or health care professional. Your mouth may get dry. Chewing sugarless gum or sucking hard candy, and drinking plenty of water may help. Contact your doctor if the problem does not go away or is severe. What side effects may I notice from receiving this medicine? Side effects that you should report to your doctor or health care professional as soon as possible: -allergic reactions like skin rash, itching or hives, swelling of the face, lips, or tongue -breathing problems -confusion -signs and symptoms of a dangerous change in heartbeat or heart rhythm like chest pain; dizziness; fast or irregular heartbeat; palpitations; feeling faint or lightheaded, falls; breathing problems -signs and symptoms of liver injury like dark yellow or brown urine; general ill feeling or flu-like symptoms; light-colored stools; loss of appetite; nausea; right upper belly pain; unusually weak or tired; yellow of the eyes or skin -signs and symptoms of low blood pressure like dizziness; feeling faint or lightheaded, falls; unusually weak or tired -trouble passing urine or change in the amount of urine Side effects that usually do not require medical attention (report to your doctor or health care professional if they continue or are bothersome): -constipation -dry mouth -nausea, vomiting -tiredness Where should I keep my medicine? Keep out of the reach of children. This medicine can be abused. Keep your medicine in a safe place to protect  it from theft. Do not share this medicine with anyone. Selling or giving away this medicine is dangerous and against the law. Follow the directions in the MedGuide. Store at room temperature between 15 and 30 degrees C (59 and 86 degrees F). This medicine may cause accidental overdose and death if it is taken by other adults, children, or pets. Remove any unused films from  the foil packs and flush any down the toilet to reduce the chance of harm. Throw away the empty foil packaging in the trash. Do not use the medicine after the expiration date.  2017 Elsevier/Gold Standard (2015-03-14 10:29:47)

## 2016-02-14 NOTE — Assessment & Plan Note (Signed)
Patient found to have positive HCV antibody during recent admission. RNA was 7,170 and genotype 1a. She will need to be referred to [facility], however she is currently working on obtaining the orange card and prefers to wait until then for referral. -Refer to ID when orange card obtained -Patient counseled on alcohol abstinence

## 2016-02-14 NOTE — Progress Notes (Signed)
CC: ARF  HPI:  Alejandra Park is a 30 y.o. female with PMH as listed below who presents for HFU management of ARF 2/2 traumatic rhabdomyolysis, opioid dependence and abuse, Hepatitis C, and anemia.  ARF 2/2 traumatic rhabdomyolysis: Patient admitted from 11/30-12/18 for ARF requiring temporary HD after becoming anuric despite aggressive IVF resuscitation. Creatinine peaked at 9.3 and was 8.8 on discharge. She reports good urine output since discharge and slowly improving RLE muscle soreness. She ambulates with a cane and feels like this is slowly improving as well. She is not taking any medication other than Tylenol.  Opioid use disorder: Patient with history of opioid dependence beginning with prescription narcotics for her low back pain. She became addicted and eventually progressed to IV heroin use. She was previously treated with Methadone which she discontinued due to financial constraints and time commitment. She was reportedly clean for 6 months until relapse and overdose which led to her most recent hospital admission. Patient is interested in Suboxone treatment to reduce risk of relapse. Patient initially tells me that she has not used heroin since her discharge, however after collecting urine she does admit that she "used a little heroin" on Tuesday (02/11/2016).   Hepatitis C: Patient found to have positive HCV antibody during recent admission. RNA was 7,170 and genotype 1a. She will need to be referred to [facility], however she is currently working on obtaining the orange card and prefers to wait until then for referral.  Anemia: Patient required transfusion on 1 unit PRBCs due to drop in Hgb from 10.3 to 6.6 secondary to a peritoneal hematoma and ARF. FOBT was negative and Hgb remained stable between 7.7 - 8.5. She denies any obvious bleeding.  Past Medical History:  Diagnosis Date  . Bulging disc   . Chronic back pain   . Drug abuse, opioid type   . Fracture of sternum   .  History of suicidal ideation 05/2011  . Polysubstance abuse   . Umbilical hernia     Review of Systems:   Review of Systems  Constitutional: Positive for weight loss. Negative for chills, diaphoresis and fever.  Respiratory: Negative for hemoptysis and shortness of breath.   Cardiovascular: Negative for chest pain and leg swelling.  Gastrointestinal: Negative for abdominal pain, blood in stool, diarrhea, melena, nausea and vomiting.  Genitourinary: Positive for frequency. Negative for dysuria, hematuria and urgency.  Musculoskeletal: Positive for back pain. Negative for falls.       Muscle soreness right thigh, right knee pain  Skin: Negative for rash.  Neurological: Negative for loss of consciousness.       Numbness left lateral leg from buttocks to knee  Psychiatric/Behavioral: Negative for substance abuse.     Physical Exam:  Vitals:   02/14/16 1354  BP: 127/67  Pulse: 80  Temp: 98.5 F (36.9 C)  TempSrc: Oral  SpO2: 97%  Weight: 157 lb 4.8 oz (71.4 kg)  Height: 5\' 7"  (1.702 m)   Physical Exam  Constitutional: She is oriented to person, place, and time. She appears well-developed and well-nourished. No distress.  Cardiovascular: Normal rate and regular rhythm.   No murmur heard. Pulmonary/Chest: Effort normal. No respiratory distress. She has no wheezes. She has no rales.  Musculoskeletal: She exhibits no edema.  LLE strength 5/5, ROM intact. RLE strength 5/5 with hip flexion, ankle flexion and extension.  Right knee extension limited on resistance due to pain, ROM intact with passive movement. Right thigh and knee slightly tender with palpation.  Slight swelling suprapatellar region. No erythema or increased warmth to touch at the knee. Mild tenderness to palpation over lumbar spine.  Neurological: She is alert and oriented to person, place, and time.  Skin: She is not diaphoretic.    Assessment & Plan:   See Encounters Tab for problem based charting.  Patient  seen with Dr. Oswaldo DoneVincent

## 2016-02-14 NOTE — Assessment & Plan Note (Signed)
Patient required transfusion on 1 unit PRBCs due to drop in Hgb from 10.3 to 6.6 secondary to a peritoneal hematoma and ARF. FOBT was negative and Hgb remained stable between 7.7 - 8.5. She denies any obvious bleeding. -Check CBC

## 2016-02-14 NOTE — Progress Notes (Signed)
Internal Medicine Clinic Attending  I saw and evaluated the patient.  I personally confirmed the key portions of the history and exam documented by Dr. Zada Finders and I reviewed pertinent patient test results.  The assessment, diagnosis, and plan were formulated together and I agree with the documentation in the resident's note.  30 year old woman who I met during her extended hospitalization for rhabdomyolysis and acute renal failure following a heroin overdose. She has moderate opioid use disorder previously managed with methadone at [facility] treatment center. Was off treatment for about eight months and relapsed. She was discharged on Monday. She reports using heroin on Tuesday, said she had to because of her right knee pain. Tuesday was her last use, she does not have a recent history of withdrawal. She is still interested in suboxone pharmacotherapy to treat OUD. We talked about the risks including sedation, constipation, and withdrawal if she stops this medication abruptly. I dont think she is at risk for precepitated withdrawal because her last use was Tuesday and prior she was hospitalized for 18 days with minimal narcotic pain meds. We reviewed our controlled substance contract and both signed with clear expectations, she was given a copy. Our goal of treatment is to prevent relapse and keep her engaged in treatment. I prescribed her Suboxone 8-6m films, I explained sublingual administration clearly, advised taking it once daily. Follow up within one week on 1/27, I provided her with 6 films to get to that appointment. She is having chronic pain and still strong cravings, so I anticipate she will need at least 171mdaily. She will see my colleague Dr. MuDaryll Drownn Wednesday.

## 2016-02-15 LAB — CBC
HEMOGLOBIN: 9.6 g/dL — AB (ref 11.1–15.9)
Hematocrit: 30.3 % — ABNORMAL LOW (ref 34.0–46.6)
MCH: 28.9 pg (ref 26.6–33.0)
MCHC: 31.7 g/dL (ref 31.5–35.7)
MCV: 91 fL (ref 79–97)
PLATELETS: 404 10*3/uL — AB (ref 150–379)
RBC: 3.32 x10E6/uL — ABNORMAL LOW (ref 3.77–5.28)
RDW: 15.2 % (ref 12.3–15.4)
WBC: 5 10*3/uL (ref 3.4–10.8)

## 2016-02-15 LAB — CMP14 + ANION GAP
A/G RATIO: 1.4 (ref 1.2–2.2)
ALT: 24 IU/L (ref 0–32)
AST: 12 IU/L (ref 0–40)
Albumin: 4.3 g/dL (ref 3.5–5.5)
Alkaline Phosphatase: 71 IU/L (ref 39–117)
Anion Gap: 23 mmol/L — ABNORMAL HIGH (ref 10.0–18.0)
BILIRUBIN TOTAL: 0.3 mg/dL (ref 0.0–1.2)
BUN/Creatinine Ratio: 9 (ref 9–23)
BUN: 32 mg/dL — AB (ref 6–20)
CHLORIDE: 103 mmol/L (ref 96–106)
CO2: 20 mmol/L (ref 18–29)
Calcium: 9.7 mg/dL (ref 8.7–10.2)
Creatinine, Ser: 3.61 mg/dL — ABNORMAL HIGH (ref 0.57–1.00)
GFR calc non Af Amer: 16 mL/min/{1.73_m2} — ABNORMAL LOW (ref >59)
GFR, EST AFRICAN AMERICAN: 18 mL/min/{1.73_m2} — AB (ref >59)
GLUCOSE: 78 mg/dL (ref 65–99)
Globulin, Total: 3.1 g/dL (ref 1.5–4.5)
Potassium: 4.3 mmol/L (ref 3.5–5.2)
Sodium: 146 mmol/L — ABNORMAL HIGH (ref 134–144)
Total Protein: 7.4 g/dL (ref 6.0–8.5)

## 2016-02-19 ENCOUNTER — Ambulatory Visit (INDEPENDENT_AMBULATORY_CARE_PROVIDER_SITE_OTHER): Payer: Self-pay | Admitting: Pulmonary Disease

## 2016-02-19 VITALS — BP 117/71 | HR 83 | Temp 98.1°F | Ht 67.0 in | Wt 154.2 lb

## 2016-02-19 DIAGNOSIS — M25561 Pain in right knee: Secondary | ICD-10-CM

## 2016-02-19 DIAGNOSIS — F1129 Opioid dependence with unspecified opioid-induced disorder: Secondary | ICD-10-CM

## 2016-02-19 DIAGNOSIS — Z5189 Encounter for other specified aftercare: Secondary | ICD-10-CM

## 2016-02-19 DIAGNOSIS — N170 Acute kidney failure with tubular necrosis: Secondary | ICD-10-CM

## 2016-02-19 DIAGNOSIS — N179 Acute kidney failure, unspecified: Secondary | ICD-10-CM

## 2016-02-19 MED ORDER — DICLOFENAC SODIUM 1 % TD GEL
4.0000 g | Freq: Four times a day (QID) | TRANSDERMAL | 0 refills | Status: DC
Start: 2016-02-19 — End: 2016-09-01

## 2016-02-19 MED ORDER — BUPRENORPHINE HCL-NALOXONE HCL 8-2 MG SL FILM: 2.0000 | ORAL_FILM | Freq: Every day | SUBLINGUAL | 0 refills | Status: DC

## 2016-02-19 NOTE — Progress Notes (Signed)
   CC: suboxone followup  HPI:  Alejandra Park is a 30 y.o. with chronic back pain and right knee pain, polysubstance abuse, acute renal failure from rhabdomyolysis presenting for follow up of suboxone and opioid use disorder.  It is not helping her knee pain and back pain. She some cravings still. Cost is not an issue at this time. She is working on getting the orange card. Last used heroin last Tuesday. Last took suboxone at 9AM - 1 film.    Past Medical History:  Diagnosis Date  . Bulging disc   . Chronic back pain   . Drug abuse, opioid type   . Fracture of sternum   . History of suicidal ideation 05/2011  . Polysubstance abuse   . Umbilical hernia     Review of Systems:   No fevers or chills No chest pain or dyspnea.   Physical Exam:  Vitals:   02/19/16 1446  BP: 117/71  Pulse: 83  Temp: 98.1 F (36.7 C)  TempSrc: Oral  SpO2: 100%  Weight: 154 lb 3.2 oz (69.9 kg)  Height: 5\' 7"  (1.702 m)   General Apperance: NAD HEENT: Normocephalic, atraumatic, anicteric sclera Neck: Supple, trachea midline Lungs: Clear to auscultation bilaterally. No wheezes, rhonchi or rales. Breathing comfortably Heart: Regular rate and rhythm, no murmur/rub/gallop Abdomen: Soft, nontender, nondistended, no rebound/guarding Extremities: Warm and well perfused, no edema Skin: No rashes or lesions Neurologic: Alert and interactive. No gross deficits.  Assessment & Plan:   See Encounters Tab for problem based charting.  Patient seen with Dr. Criselda PeachesMullen

## 2016-02-19 NOTE — Assessment & Plan Note (Signed)
Creatinine improving from 8 to 3. She is urinating without difficulty. Recheck renal function at next visit.

## 2016-02-19 NOTE — Assessment & Plan Note (Signed)
Assessment: Mild improvement in cravings. No reported heroin use since last Tuesday. No improvement in her knee or back pain.   Plan: Increase to two films daily. May be taken at one time or twice a day. Follow up in 7 days UDS today

## 2016-02-19 NOTE — Assessment & Plan Note (Signed)
No erythema or effusion appreciated. Will prescribe Voltaren gel. Suboxone increase may help with the pain. May use OTC Tylenol. Consider other pharmacotherapies once her Suboxone dose is stable.

## 2016-02-19 NOTE — Patient Instructions (Addendum)
Take the Suboxone twice a day or two films daily You may take over the counter Tylenol for pain You may use Voltaren gel for your knee pain but we would prefer that you have the Suboxone if you cannot afford both. Follow up in 7 days

## 2016-02-20 ENCOUNTER — Encounter: Payer: Self-pay | Admitting: Student in an Organized Health Care Education/Training Program

## 2016-02-25 ENCOUNTER — Telehealth: Payer: Self-pay | Admitting: General Practice

## 2016-02-25 LAB — TOXASSURE SELECT,+ANTIDEPR,UR

## 2016-02-25 NOTE — Telephone Encounter (Signed)
APT. REMINDER CALL, NO ANSWER, NO VOICEMAIL °

## 2016-02-26 ENCOUNTER — Encounter: Payer: Self-pay | Admitting: Internal Medicine

## 2016-02-26 ENCOUNTER — Ambulatory Visit (INDEPENDENT_AMBULATORY_CARE_PROVIDER_SITE_OTHER): Payer: Self-pay | Admitting: Internal Medicine

## 2016-02-26 ENCOUNTER — Ambulatory Visit: Payer: Medicaid Other

## 2016-02-26 VITALS — BP 118/70 | HR 109 | Temp 98.7°F | Wt 151.9 lb

## 2016-02-26 DIAGNOSIS — M25561 Pain in right knee: Secondary | ICD-10-CM

## 2016-02-26 DIAGNOSIS — F1121 Opioid dependence, in remission: Secondary | ICD-10-CM

## 2016-02-26 LAB — TOXASSURE SELECT,+ANTIDEPR,UR

## 2016-02-26 MED ORDER — BUPRENORPHINE HCL-NALOXONE HCL 8-2 MG SL FILM: 2.0000 | ORAL_FILM | Freq: Every day | SUBLINGUAL | 0 refills | Status: DC

## 2016-02-26 NOTE — Patient Instructions (Addendum)
It was a pleasure to see you today Ms. Lichter.  I refilled your Suboxone prescription for the next 2 weeks. Would like to see you again at that time check in on how things are doing and higher leg is healing. You are doing a great job so far.

## 2016-02-26 NOTE — Progress Notes (Signed)
   CC: Follow up for pain medication refill  HPI:  Ms.Raguel D Ivan CroftMcCabe is a 31 y.o. woman with a history of opioid use disorder, chronic back pain, rhabomyolysis here for 1 week follow up of suboxone treatment. Her right knee pain is improving and is no longer swollen. However she does still feel some weakness and instability in the right thigh but is able to walk around without any assisting device. She thinks that her cravings are pretty well controlled on the 2 strips per day of Suboxone and has not used heroin since her last visit. She last took her Suboxone this morning around 9 AM. She has used some alcohol intermittently but states she is trying to keep avoiding this due to her medications and her hepatitis.  She is working on getting [facility] [employer] at this time.   See problem based assessment and plan below for additional details  Past Medical History:  Diagnosis Date  . Bulging disc   . Chronic back pain   . Drug abuse, opioid type   . Fracture of sternum   . History of suicidal ideation 05/2011  . Polysubstance abuse   . Umbilical hernia     Review of Systems:  Review of Systems  Constitutional: Negative for malaise/fatigue.  Respiratory: Negative for shortness of breath.   Cardiovascular: Negative for leg swelling.  Gastrointestinal: Negative for constipation.  Genitourinary: Negative for dysuria.  Musculoskeletal: Positive for joint pain and myalgias. Negative for falls.  Neurological: Negative for dizziness and weakness.  Psychiatric/Behavioral: Positive for substance abuse.    Physical Exam: Physical Exam  Constitutional: She is oriented to person, place, and time and well-developed, well-nourished, and in no distress.  HENT:  Head: Normocephalic and atraumatic.  Cardiovascular: Normal rate and regular rhythm.   Pulmonary/Chest: Effort normal and breath sounds normal.  Abdominal: Soft. Bowel sounds are normal.  Musculoskeletal:  Right thigh is tender to  palpation over the body of the quadriceps, right knee is tender on medial aspect without any palpable effusion or erythema, strength is 5/5 throughout, joint is stable, intact distal sensation  Ultrasound inspection of the suprapatellar bursa shows no effusion.  Neurological: She is alert and oriented to person, place, and time.  Skin: Skin is warm and dry.    Vitals:   02/26/16 1521  BP: 118/70  Pulse: (!) 109  Temp: 98.7 F (37.1 C)  TempSrc: Oral  SpO2: 99%  Weight: 151 lb 14.4 oz (68.9 kg)    Assessment & Plan:   See Encounters Tab for problem based charting.  Patient seen with Dr. Oswaldo DoneVincent

## 2016-02-28 NOTE — Progress Notes (Signed)
Internal Medicine Clinic Attending  I saw and evaluated the patient.  I personally confirmed the key portions of the history and exam documented by Dr. Isabella Park and I reviewed pertinent patient test results.  The assessment, diagnosis, and plan were formulated together and I agree with the documentation in the resident's note.  [name] has moderate opioid use disorder and has been initiated on suboxone therapy for this disorder.  She continues to have cravings and knee pain, though the cravings have decreased.  She would like to try a different pharmacy to see if the medication will be cheaper, but she is still committed to abstinence per our discussion today.  UDS today.  Unfortunately, due to the holiday, her UDS from last week is still not resulted.  Given the persistence of cravings, will increase suboxone to 16mg /4mg  (2 films sublingual daily).  We discussed the option to take 1 film BID to see if this better controls her pain and she will try this.  We planned a follow up appointment in 1 week with further discussion and assessment.  She will see my partner Dr. Oswaldo Park.

## 2016-02-28 NOTE — Assessment & Plan Note (Signed)
There is no knee effusion on physical exam or ultrasound examination today. She still has some tenderness in the quadriceps muscle and medial right knee but is improving as well as her stability on the leg. Ultrasound examination shows no effusion today which is encouraging since this was likely a reactive arthritis from her rhabymyolysis and clinically is improving.  Recommend to continue strengthening exercises as tolerated She is on suboxone films 8mg -2mg  BID Continue topical voltaren and PO tylenol PRN for pain relief

## 2016-02-28 NOTE — Assessment & Plan Note (Signed)
A: Her cravings are moderately controlled on the 2 strips daily Suboxone. Her knee pain is also improving with time which was a contributor to her last heroin use. She reports avoiding heroin use over the past week. She is comfortable staying at this dose for now over the next 2 weeks.  P: UDS today Suboxone 8-2 mg strips refilled 28 Follow up in 14 days

## 2016-02-28 NOTE — Progress Notes (Signed)
Internal Medicine Clinic Attending  I saw and evaluated the patient.  I personally confirmed the key portions of the history and exam documented by Dr. Dimple Caseyice and I reviewed pertinent patient test results.  The assessment, diagnosis, and plan were formulated together and I agree with the documentation in the resident's note.  Patient with moderate opioid use disorder that we are treating with Suboxone since 02/14/16. She has been on 16mg  via films once daily for one week and feels her cravings are much improved. Denies any heroin use since 02/11/16. Right knee pain is improving, she had a reactive arthritis while hospitalized associated with a large joint effusion that we drained twice. I did a POCUS of the knee in clinic which shows resolution of the effusion. I anticipate this pain to fully resolve over the coming weeks.   Utox #1 largely ok, but had residual alcohol.  Utox #2 positive for suboxone, but had a metabolite of cocaine, no primary cocaine compounds though.  Utox #3 obtained today.   I reviewed controlled database which shows appropriate dispensing. [facility]. [facility], and [facility], which she thinks will be $6 per script.   Plan to continue with Suboxone at 16 mg daily. Follow up with me in 2 weeks. I gave her a script for Suboxone 8mg  films, two per day, #28, no refills. This should last to our follow up visit.

## 2016-03-03 LAB — TOXASSURE SELECT,+ANTIDEPR,UR

## 2016-03-04 ENCOUNTER — Telehealth: Payer: Self-pay | Admitting: Student in an Organized Health Care Education/Training Program

## 2016-03-04 NOTE — Telephone Encounter (Signed)
U Tox from 1/3 visit is again positive for a metabolite of cocaine. This is similar to the U Tox from 12/27. I have not yet had the chance to address this with Lurena JoinerRebecca given the one week turnaround on this test. I tried calling her today but no response and no VM set up. I will try to call again this week, and will see her in a visit next Wednesday to discuss this. I am not sure why only a metabolite is positive and not the substance itself. If this continues, it may be an indication for us to escalate her care and refer her to behavioral health for an evaluation and counseling, in addition to [facility].

## 2016-03-06 NOTE — Telephone Encounter (Signed)
Do you know if she is possibly taking anything that could cause this metabolite, something herbal?  Is there anything that cross reacts?  I know this is unlikely, but maybe the lab could find out from [facility].   EBM

## 2016-03-10 ENCOUNTER — Telehealth: Payer: Self-pay | Admitting: Internal Medicine

## 2016-03-10 ENCOUNTER — Telehealth: Payer: Self-pay | Admitting: Student in an Organized Health Care Education/Training Program

## 2016-03-10 NOTE — Telephone Encounter (Signed)
Ok. I tried calling her a second time with no answer, and no VM set up. I went ahead and called in a refill for Suboxone 8mg , two films daily, #14 for a one week supply. She should make a follow up with us in clinic next week. If she calls the on call pager, the resident should direct [facility]ill available.

## 2016-03-10 NOTE — Telephone Encounter (Signed)
Alejandra Park is in need of a refill Rx for Suboxone. Based on my last refill, she should run out on Thursday. She is schedule for clinic tomorrow afternoon (Wednesday), but I just heard that our clinic is closing Wed and Thurs in anticipation of winter weather. I tried calling Alejandra Park but no answer and no VM set up. I could see her in clinic most any time today. I am not sure she will be able to make it to Friday without withdrawal. I will try to call her again this afternoon. If Dr. Criselda PeachesMullen is here in the hospital on an inpatient team maybe she could give her a short refill to bridge until her rescheduled appointment? But without her picking up her phone I don't know how to communicate this change to her.   Any thoughts Alejandra Park?

## 2016-03-10 NOTE — Telephone Encounter (Signed)
CALLED PT, NO ANSWER, NO VOICEMAIL, CANCELLED APT. DUE TO BAD WEATHER

## 2016-03-10 NOTE — Telephone Encounter (Signed)
I just had a thought. Suboxone is schedule III, so I can call in a refill for her to bridge until the clinic reopens. I will plan to do that, but I still don't know how to tell Lurena JoinerRebecca this because she doesn't answer her phone.

## 2016-03-10 NOTE — Telephone Encounter (Signed)
Have called 3 times this pm, will continue trying

## 2016-03-10 NOTE — Telephone Encounter (Signed)
That is a good plan.  I will also be here on Wed and Thurs so can address any issues that come up.  Thanks!

## 2016-03-11 ENCOUNTER — Ambulatory Visit: Payer: Self-pay

## 2016-03-13 ENCOUNTER — Ambulatory Visit (INDEPENDENT_AMBULATORY_CARE_PROVIDER_SITE_OTHER): Payer: Self-pay | Admitting: Internal Medicine

## 2016-03-13 VITALS — BP 112/65 | HR 105 | Temp 98.1°F | Wt 151.0 lb

## 2016-03-13 DIAGNOSIS — F1420 Cocaine dependence, uncomplicated: Secondary | ICD-10-CM

## 2016-03-13 DIAGNOSIS — B182 Chronic viral hepatitis C: Secondary | ICD-10-CM

## 2016-03-13 DIAGNOSIS — N179 Acute kidney failure, unspecified: Secondary | ICD-10-CM

## 2016-03-13 DIAGNOSIS — F1121 Opioid dependence, in remission: Secondary | ICD-10-CM

## 2016-03-13 DIAGNOSIS — N170 Acute kidney failure with tubular necrosis: Secondary | ICD-10-CM

## 2016-03-13 NOTE — Patient Instructions (Signed)
[name],  You have a [facility] Dr. Oswaldo DoneVincent will be in clinic next Thursday afternoon and will be able to get you back on your 2 week schedule at that point.

## 2016-03-13 NOTE — Progress Notes (Signed)
   CC: Follow up for pain medication refill  HPI:  [name] is a 31 y.o. female with a past medical history listed below here today for follow up of her opioid use disorder and Suboxone treatment. She reports that she was scheduled to follow up in clinic this previous Wednesday the 17th but due to inclement weather the clinic has been closed for the past 2 days. She was started on Suboxone 02/14/16 and is currently on Suboxone 8 mg bid. According to Alejandra Park's telephone note from 03/10/16 she should run out of her Suboxone on Thursday the 18th. He attempted to contact the patient when it was announced that clinic would be closed for the weather but she did not answer her phone and her voicemail was not set up. He sent in a Rx for Suboxone 8 mg bid #14 for a week's worth but she was unaware. She says that she would have run out on Wednesday the 17th but only took one on Wednesday and one on Thursday to try and ration. She denies any withdrawal symptoms today. [facility] it appears she last filled her Rx on 03/08/16 for a 1 week supply. It would appear she may be using it more frequently than reporting if she has run out this quickly. In addition, her previous two UDS tests were positive for cocaine. When questioned she reported that she has been drinking and may have done cocaine at those times. Discussed the need for cocaine cessation today.   She reports she is doing well with no further issues with edema or urination. Requesting to have her blood work done to ensure resolution of her renal failure.     Past Medical History:  Diagnosis Date  . Bulging disc   . Chronic back pain   . Drug abuse, opioid type   . Fracture of sternum   . History of suicidal ideation 05/2011  . Polysubstance abuse   . Umbilical hernia     Review of Systems:   Negative except as noted in HPI  Physical Exam:  Vitals:   03/13/16 1454  BP: 112/65  Pulse: (!) 105  Temp: 98.1 F (36.7  C)  TempSrc: Oral  SpO2: 100%  Weight: 151 lb (68.5 kg)   Physical Exam  Constitutional: She is oriented to person, place, and time and well-developed, well-nourished, and in no distress.  HENT:  Head: Normocephalic and atraumatic.  Cardiovascular: Normal rate and regular rhythm.   Pulmonary/Chest: Effort normal and breath sounds normal.  Abdominal: Soft. Bowel sounds are normal.  Skin: Skin is warm and dry.   Assessment & Plan:   See Encounters Tab for problem based charting.  Patient discussed with Alejandra Park

## 2016-03-13 NOTE — Progress Notes (Signed)
Medicine attending: Medical history, presenting problems, physical findings, and medications, reviewed with resident physician Dr Valentino NoseNathan Boswell on the day of the patient visit and I concur with his evaluation and management plan. Recovering heroin addict with recent admission for rhabdomyolysis. Started on subaxone. Continue supervised rehab program.

## 2016-03-13 NOTE — Assessment & Plan Note (Signed)
She reports that she was scheduled to follow up in clinic this previous Wednesday the 17th but due to inclement weather the clinic has been closed for the past 2 days. She was started on Suboxone 02/14/16 and is currently on Suboxone 8 mg bid. According to Dr. Jacqualin CombesVincent's telephone note from 03/10/16 she should run out of her Suboxone on Thursday the 18th. He attempted to contact the patient when it was announced that clinic would be closed for the weather but she did not answer her phone and her voicemail was not set up. He sent in a Rx for Suboxone 8 mg bid #14 for a week's worth but she was unaware. She says that she would have run out on Wednesday the 17th but only took one on Wednesday and one on Thursday to try and ration. She denies any withdrawal symptoms today. [facility] it appears she last filled her Rx on 03/08/16 for a 1 week supply. It would appear she may be using it more frequently than reporting if she has run out this quickly. In addition, her previous two UDS tests were positive for cocaine. When questioned she reported that she has been drinking and may have done cocaine at those times. Discussed the need for cocaine cessation today.   A/P [facility] Follow up in clinic in 1 week when Dr. Oswaldo DoneVincent is in clinic

## 2016-03-13 NOTE — Assessment & Plan Note (Signed)
She reports she is doing well with no further issues with edema or urination. Requesting to have her blood work done to ensure resolution of her renal failure.   A/P bmet today

## 2016-03-13 NOTE — Assessment & Plan Note (Signed)
Still working on obtaining orange card. Will refer to ID once able to obtain.

## 2016-03-14 LAB — BMP8+ANION GAP
Anion Gap: 10 mmol/L (ref 10.0–18.0)
BUN / CREAT RATIO: 19 (ref 9–23)
BUN: 14 mg/dL (ref 6–20)
CHLORIDE: 103 mmol/L (ref 96–106)
CO2: 23 mmol/L (ref 18–29)
CREATININE: 0.74 mg/dL (ref 0.57–1.00)
Calcium: 9.7 mg/dL (ref 8.7–10.2)
GFR calc non Af Amer: 108 mL/min/{1.73_m2} (ref >59)
GFR, EST AFRICAN AMERICAN: 125 mL/min/{1.73_m2} (ref >59)
GLUCOSE: 81 mg/dL (ref 65–99)
Potassium: 4.7 mmol/L (ref 3.5–5.2)
Sodium: 136 mmol/L (ref 134–144)

## 2016-03-18 ENCOUNTER — Telehealth: Payer: Self-pay | Admitting: Internal Medicine

## 2016-03-18 NOTE — Telephone Encounter (Signed)
APT. REMINDER CALL, NO ANSWER, NO VOICEMAIL °

## 2016-03-19 ENCOUNTER — Ambulatory Visit (INDEPENDENT_AMBULATORY_CARE_PROVIDER_SITE_OTHER): Payer: Self-pay | Admitting: Internal Medicine

## 2016-03-19 VITALS — BP 97/64 | HR 72 | Temp 98.0°F | Wt 151.5 lb

## 2016-03-19 DIAGNOSIS — F1421 Cocaine dependence, in remission: Secondary | ICD-10-CM

## 2016-03-19 DIAGNOSIS — Z9851 Tubal ligation status: Secondary | ICD-10-CM

## 2016-03-19 DIAGNOSIS — Z23 Encounter for immunization: Secondary | ICD-10-CM

## 2016-03-19 DIAGNOSIS — B182 Chronic viral hepatitis C: Secondary | ICD-10-CM

## 2016-03-19 DIAGNOSIS — Z0000 Encounter for general adult medical examination without abnormal findings: Secondary | ICD-10-CM

## 2016-03-19 DIAGNOSIS — B1920 Unspecified viral hepatitis C without hepatic coma: Secondary | ICD-10-CM

## 2016-03-19 DIAGNOSIS — Z8742 Personal history of other diseases of the female genital tract: Secondary | ICD-10-CM

## 2016-03-19 DIAGNOSIS — F1121 Opioid dependence, in remission: Secondary | ICD-10-CM

## 2016-03-19 MED ORDER — BUPRENORPHINE HCL-NALOXONE HCL 8-2 MG SL FILM: 2.0000 | ORAL_FILM | Freq: Every day | SUBLINGUAL | 0 refills | Status: DC

## 2016-03-19 NOTE — Progress Notes (Signed)
   CC: medication refill  HPI:  [name] is a 31 y.o. female with a past medical history listed below here today for follow up of her opioid dependance for medication refill.  For details of today's visit and the status of her chronic medical issues please refer to the assessment and plan.  Past Medical History:  Diagnosis Date  . Bulging disc   . Chronic back pain   . Drug abuse, opioid type   . Fracture of sternum   . History of suicidal ideation 05/2011  . Polysubstance abuse   . Umbilical hernia    Review of Systems:   See HPI  Physical Exam:  Vitals:   03/19/16 1619  BP: 97/64  Pulse: 72  Temp: 98 F (36.7 C)  TempSrc: Oral  SpO2: 100%  Weight: 151 lb 8 oz (68.7 kg)   Physical Exam  Constitutional: She is well-developed, well-nourished, and in no distress. No distress.  Cardiovascular: Normal rate, regular rhythm and normal heart sounds.   Pulmonary/Chest: Effort normal and breath sounds normal. She has no wheezes. She has no rales.  Psychiatric: Mood and affect normal.     Assessment & Plan:   See Encounters Tab for problem based charting.  Patient seen with Dr. Oswaldo Park

## 2016-03-19 NOTE — Patient Instructions (Signed)
[name],  Please come back to clinic next Thursday. We need to see you back every week until you are able to give us two clear urine drug screens and then we can space these visit out after that.

## 2016-03-20 ENCOUNTER — Encounter: Payer: Self-pay | Admitting: Student in an Organized Health Care Education/Training Program

## 2016-03-20 DIAGNOSIS — Z9851 Tubal ligation status: Secondary | ICD-10-CM | POA: Insufficient documentation

## 2016-03-20 DIAGNOSIS — Z0000 Encounter for general adult medical examination without abnormal findings: Secondary | ICD-10-CM | POA: Insufficient documentation

## 2016-03-20 DIAGNOSIS — Z8742 Personal history of other diseases of the female genital tract: Secondary | ICD-10-CM | POA: Insufficient documentation

## 2016-03-20 HISTORY — DX: Personal history of other diseases of the female genital tract: Z87.42

## 2016-03-20 LAB — TOXASSURE SELECT,+ANTIDEPR,UR

## 2016-03-20 NOTE — Assessment & Plan Note (Signed)
Reports she will be able to obtain the orange card at the end of the month. Will need referral to ID once this occurs.

## 2016-03-20 NOTE — Progress Notes (Signed)
Internal Medicine Clinic Attending  I saw and evaluated the patient.  I personally confirmed the key portions of the history and exam documented by Dr. Karma GreaserBoswell and I reviewed pertinent patient test results.  The assessment, diagnosis, and plan were formulated together and I agree with the documentation in the resident's note.  31 year old woman with moderate OUD now [facility]. She is doing well on 16 mg of Suboxone daily, no recent drug use, cravings are under control, motivation is improving, no withdrawal. She is now looking for a regular daytime job. Last drug use was cocaine on 02/23/16. Urine tox from OV last week is still pending, but she is confident it will be clean. Plan is to continue suboxone 8mg , two tabs daily. I gave her #14 for a one week supply. We will see her weekly until we have two clean urine tox results (not collected today), then can go back to seeing her every two weeks.

## 2016-03-20 NOTE — Assessment & Plan Note (Addendum)
Reports she has been doing well on the Suboxone. She has been doing trials of taking both films in the morning vs one film in the am and one in the pm to best control her symptoms. Believes she has less insomnia with 2 films in the am vs bid. Denies any withdrawal symptoms today. Denies any illicit drug use. Denies any anhedonia and reports being motivated to continue treatment. Is looking for a job.  Assessment: Opioid dependence  Plan: Rx for Suboxone 8 mg film bid #14 provided by Dr. Oswaldo DoneVincent today.  Will need weekly visits until able to provide 2 clean UDS.  UDS from 1/19 pending Follow up in 1 week  Addendum UDS from 1/19 with expected buprenorphine and norbuprenorphine. Positive for metabolites of ethyl alcohol but negative for any illicit drugs.

## 2016-03-20 NOTE — Assessment & Plan Note (Addendum)
Reports last one done this past June 2017 while in prison. Reports being told in September/October that she needed a follow up PAP for possible HPV. Reports a history of abnormal PAP after giving birth with normal biopsy 10 years ago.   Assessment: history of abnormal PAP smears  Plan: PAP smear performed today, f/u results  Addendum PAP smear normal with HPV negative

## 2016-03-20 NOTE — Assessment & Plan Note (Signed)
Unclear vaccination history. Tdap booster given today.

## 2016-03-20 NOTE — Assessment & Plan Note (Signed)
Reports having had tubal ligation surgery in the past. Reports being last sexually active ~1 month ago with one female partner. Reports condom use.

## 2016-03-23 LAB — CYTOLOGY - PAP
Diagnosis: NEGATIVE
HPV (WINDOPATH): NOT DETECTED

## 2016-03-25 ENCOUNTER — Telehealth: Payer: Self-pay | Admitting: Internal Medicine

## 2016-03-25 NOTE — Telephone Encounter (Signed)
APT. REMINDER CALL, LMTCB °

## 2016-03-26 ENCOUNTER — Encounter: Payer: Self-pay | Admitting: Internal Medicine

## 2016-03-26 ENCOUNTER — Ambulatory Visit (INDEPENDENT_AMBULATORY_CARE_PROVIDER_SITE_OTHER): Payer: Self-pay | Admitting: Internal Medicine

## 2016-03-26 ENCOUNTER — Other Ambulatory Visit: Payer: Self-pay | Admitting: Internal Medicine

## 2016-03-26 VITALS — BP 105/62 | HR 77 | Temp 98.5°F | Ht 67.0 in | Wt 147.6 lb

## 2016-03-26 DIAGNOSIS — F1120 Opioid dependence, uncomplicated: Secondary | ICD-10-CM

## 2016-03-26 DIAGNOSIS — Z79891 Long term (current) use of opiate analgesic: Secondary | ICD-10-CM

## 2016-03-26 DIAGNOSIS — F1121 Opioid dependence, in remission: Secondary | ICD-10-CM

## 2016-03-26 MED ORDER — BUPRENORPHINE HCL-NALOXONE HCL 8-2 MG SL FILM: 2.0000 | ORAL_FILM | Freq: Every day | SUBLINGUAL | 0 refills | Status: DC

## 2016-03-26 NOTE — Assessment & Plan Note (Addendum)
She has been [facility]. She has been doing well since last visit one week ago. She has not used drugs since last office visit. She has joined a gym and has continued her job [employer]. She has some pain in the muscles surrounding her right knee and left hip while working out. When she takes afternoon suboxone dose after 3 pm she has some difficulty falling asleep at night but has no problems otherwise. She is feeling well and denies sadness, depression, or any new concerns or complaints.   UDS 1/25 showed buprenorphine and norbuprenorphine, alcohol, and no illicit drugs. She reports no illicit drug use in the past week. Will perform another UDS today and have her back to clinic next week.  - refilled suboxone 8 mg film BID #14 by Dr. Criselda PeachesMullen today  - return to clinic in one week  -when she has had two clean urine tox results she can go back to follow up every 2 weeks -provided note for permission to return to work   Addendum: UDS was appropriate for Buprenorphine and norbuprenorphine components of suboxone and negative for illicit drugs.

## 2016-03-26 NOTE — Patient Instructions (Addendum)
It was a pleasure to meet you today [name],   You're doing a great job, keep up the good work and call us if you need anything.   Please schedule a follow up appointment to be seen in clinic in 1 week

## 2016-03-26 NOTE — Progress Notes (Signed)
   CC: follow up for opioid use disorder and replacement therapy   HPI: [name] is a 31 y.o. with past medical history as outlined below who presents to clinic for follow up of opioid use disorder.  She [facility] She has been doing well since last visit one week ago. She has not used drugs since last office visit. She has joined a gym and has continued her job [employer]. She has some pain in the muscles surrounding her right knee and left hip while working out. When she takes afternoon suboxone dose after 3 pm she has some difficulty falling asleep at night but has no problems otherwise. She is feeling well and denies sadness, depression, or any new concerns or complaints.   Please see problem list for status of the pt's chronic medical problems.  Past Medical History:  Diagnosis Date  . Acute renal failure (ARF) (HCC) 01/24/2016   Due to rhabdomyolysis, required HD for 10 days then fully recovered  . Moderate opioid use disorder (HCC)   . Rhabdomyolysis 01/24/2016   Traumatic rhabdo following opioid overdose    Review of Systems:  Please see each problem below for a pertinent review of systems.  Physical Exam:  Vitals:   03/26/16 1439  BP: 105/62  Pulse: 77  Temp: 98.5 F (36.9 C)  TempSrc: Oral  SpO2: 100%  Weight: 147 lb 9.6 oz (67 kg)  Height: 5\' 7"  (1.702 m)   Physical Exam  Constitutional: She appears well-developed and well-nourished. No distress.  Eyes: Conjunctivae are normal. No scleral icterus.  Cardiovascular: Normal rate and regular rhythm.   No murmur heard. Pulmonary/Chest: Effort normal and breath sounds normal. No respiratory distress. She has no wheezes. She has no rales.  Abdominal: Soft. Bowel sounds are normal. She exhibits no distension. There is no tenderness.  Neurological: She is alert.  Skin: Skin is warm and dry. She is not diaphoretic.  Psychiatric: She has a normal mood and affect. Her behavior is  normal.    Assessment & Plan:   See Encounters Tab for problem based charting.  Opioid use disorder  UDS 1/25 showed buprenorphine and norbuprenorphine, alcohol, and no illicit drugs. She reports no illicit drug use in the past week. Will perform another UDS today and have her back to clinic next week.  - refilled suboxone 8 mg film BID #14 by Dr. Criselda PeachesMullen today  - return to clinic in one week  -when she has had two clean urine tox results she can go back to follow up every 2 weeks -provided note for permission to return to work   Patient seen with Dr. Criselda PeachesMullen

## 2016-03-27 ENCOUNTER — Ambulatory Visit: Payer: Self-pay | Admitting: Pharmacist

## 2016-03-31 NOTE — Progress Notes (Signed)
Internal Medicine Clinic Attending  I saw and evaluated the patient.  I personally confirmed the key portions of the history and exam documented by Dr. Obie DredgeBlum and I reviewed pertinent patient test results.  The assessment, diagnosis, and plan were formulated together and I agree with the documentation in the resident's note.  31 year old woman with moderate OUD now [facility]. Last drug use was cocaine on 02/23/16. She is doing well, looks much improved today.  Controlling cravings with suboxone.  Interested in going back to work and looking for new work (works as Horticulturist, commercialdancer, still with occasional knee pain.)  UDS planned today.  If she has 2 appropriate UDS in a row, can be stretched out to every 2 week follow up.  Provided Rx for suboxone 16mg  qdaily, 14 films, 7 day supply with no refills.  [facility].  I discussed with Dr. Selena BattenKim today ways to obtain suboxone at a cheaper rate for her and Dr. Selena BattenKim will investigate.

## 2016-04-01 ENCOUNTER — Telehealth: Payer: Self-pay | Admitting: Internal Medicine

## 2016-04-01 LAB — TOXASSURE SELECT,+ANTIDEPR,UR

## 2016-04-01 NOTE — Telephone Encounter (Signed)
APT. REMINDER CALL, LMTCB °

## 2016-04-02 ENCOUNTER — Ambulatory Visit (INDEPENDENT_AMBULATORY_CARE_PROVIDER_SITE_OTHER): Payer: Self-pay | Admitting: Internal Medicine

## 2016-04-02 ENCOUNTER — Encounter: Payer: Self-pay | Admitting: Internal Medicine

## 2016-04-02 VITALS — BP 117/61 | HR 70 | Temp 98.2°F | Wt 145.4 lb

## 2016-04-02 DIAGNOSIS — Z760 Encounter for issue of repeat prescription: Secondary | ICD-10-CM

## 2016-04-02 DIAGNOSIS — F1120 Opioid dependence, uncomplicated: Secondary | ICD-10-CM

## 2016-04-02 DIAGNOSIS — F1421 Cocaine dependence, in remission: Secondary | ICD-10-CM

## 2016-04-02 DIAGNOSIS — M6281 Muscle weakness (generalized): Secondary | ICD-10-CM

## 2016-04-02 DIAGNOSIS — F1121 Opioid dependence, in remission: Secondary | ICD-10-CM

## 2016-04-02 MED ORDER — BUPRENORPHINE HCL-NALOXONE HCL 8-2 MG SL FILM: 2.0000 | ORAL_FILM | Freq: Every day | SUBLINGUAL | 0 refills | Status: DC

## 2016-04-02 NOTE — Progress Notes (Signed)
   CC: Medication refill  HPI:  [name] is a 31 y.o. F with medical history as outlined below who presents to clinic for follow up of her opioid use disorder. She is currently on Suboxone provided by our clinic since 02/14/16 and reports she is doing well with this medicine. Has not used drugs since Dec 27th (cocaine) and reports she was able to start back at work on Monday and Tuesday night. She reports she still notices some significant muscle weakness however is improving. Has joined a gym to help regain strength. No other complaints, reports she is feeling well.    Past Medical History:  Diagnosis Date  . Acute renal failure (ARF) (HCC) 01/24/2016   Due to rhabdomyolysis, required HD for 10 days then fully recovered  . Moderate opioid use disorder (HCC)   . Rhabdomyolysis 01/24/2016   Traumatic rhabdo following opioid overdose    Review of Systems:  Review of Systems  Constitutional: Negative for chills, fever and weight loss.  Respiratory: Negative for cough, shortness of breath and wheezing.   Cardiovascular: Negative for chest pain and leg swelling.  Gastrointestinal: Negative for abdominal pain, nausea and vomiting.  Musculoskeletal: Positive for joint pain (occasional knee pain).  Neurological: Negative for tingling, weakness and headaches.   Physical Exam: Physical Exam  Constitutional: She appears well-developed and well-nourished. No distress.  HENT:  Head: Normocephalic and atraumatic.  Mouth/Throat: No oropharyngeal exudate.  Cardiovascular: Normal rate, regular rhythm and normal heart sounds.   Pulmonary/Chest: Effort normal and breath sounds normal. No respiratory distress.  Abdominal: Soft. Bowel sounds are normal. She exhibits no distension.  Musculoskeletal: She exhibits no edema or tenderness.  Muscle wasting of right thigh however strength still intact.  Skin: Skin is warm and dry. She is not diaphoretic.  Psychiatric: She has a normal mood and  affect. Her behavior is normal.   There were no vitals filed for this visit.  Assessment & Plan:   See Encounters Tab for problem based charting.  Patient seen with Dr. Criselda PeachesMullen

## 2016-04-02 NOTE — Assessment & Plan Note (Signed)
[facility] and has been doing well. Reports compliance and abstinence of drugs since 12/27 in which she used cocaine. 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 Has a gym membership to help regain strength.  UDS obtained 03/26/16 was reviewed and was appropriate. I also reviewed the patient in the controlled substance database and was also appropriate. She will have another UDS today and if appropriate, will be able to follow-up with us in 2 weeks.  -UDS today -2 week Rx given of Suboxone and patient to follow-up at that time

## 2016-04-02 NOTE — Patient Instructions (Addendum)
It was a pleasure seeing you today! You are doing so much better since when I last saw you. I'm so glad you are feeling better. It will take some time for you to regain your muscle mass.  1. Today we talked about your Suboxone prescription. You are doing a great job and we are so proud of your work, keep it up!! 2. Please come back in 2 weeks for another refill!

## 2016-04-13 LAB — TOXASSURE SELECT,+ANTIDEPR,UR

## 2016-04-16 NOTE — Progress Notes (Signed)
Internal Medicine Clinic Attending  I saw and evaluated the patient.  I personally confirmed the key portions of the history and exam documented by Dr. Vincente Park and I reviewed pertinent patient test results.  The assessment, diagnosis, and plan were formulated together and I agree with the documentation in the resident's note.  31 year old woman with moderate OUD now in [facility]. Last drug use was cocaine on 02/23/16. She is doing well, appears comfortable on exam today.  Her cravings are well controlled on current dose of suboxone.    Last two UDS were reviewed and appeared appropriate (she did have a very mildly low Cr on last UDS, but buprenorphine was present).  Provided Rx for suboxone 16mg  qdaily, 28 films, 14 day supply with no refills.  She [facility] Oswaldo Park.  We will continue to work to obtain free medication from the manufacturer for her.

## 2016-04-22 ENCOUNTER — Telehealth: Payer: Self-pay | Admitting: Student in an Organized Health Care Education/Training Program

## 2016-04-22 MED ORDER — BUPRENORPHINE HCL-NALOXONE HCL 8-2 MG SL FILM: 2.0000 | ORAL_FILM | Freq: Every day | SUBLINGUAL | 0 refills | Status: DC

## 2016-04-22 MED FILL — SUBOXONE 8 MG-2 MG SL FILM: 8-2 | 14 days supply | Qty: 28 | Fill #0

## 2016-04-22 NOTE — Telephone Encounter (Signed)
Patient did not show to complete the paperwork also maybe due to being ill. I faxed it in today to get it started and will call the company Friday to see what other information they need. Will keep trying

## 2016-04-22 NOTE — Telephone Encounter (Signed)
I called Alejandra Park because it has been over two weeks since we have seen her in clinic. Database says last [name] dispense was 2/13. She says that she had a viral illness last week keeping her from coming in. She ran out of suboxone yesterday, reports financial strain with copays and has been decreasing dose a little to make the script last longer. Also points to home stressors which includes starting back at work (which is likely not a good environment) and her mother is getting divorced and needed help moving to another apartment. She says she has not used other illicit drugs since last seen.  We made a plan that she will schedule [facility]. I will call in a two week [facility] per her request. I will also check with Dr. Selena BattenKim who has sent an application for med assistance with the pharma company which will likely help. Please collect a tox screen at next visit.

## 2016-04-23 ENCOUNTER — Telehealth: Payer: Self-pay | Admitting: Internal Medicine

## 2016-04-23 NOTE — Telephone Encounter (Signed)
APT. REMINDER CALL, LMTCB °

## 2016-04-24 ENCOUNTER — Ambulatory Visit: Payer: Self-pay

## 2016-04-27 ENCOUNTER — Encounter: Payer: Self-pay | Admitting: Internal Medicine

## 2016-04-27 ENCOUNTER — Ambulatory Visit (INDEPENDENT_AMBULATORY_CARE_PROVIDER_SITE_OTHER): Payer: Self-pay | Admitting: Internal Medicine

## 2016-04-27 ENCOUNTER — Telehealth: Payer: Self-pay | Admitting: Pharmacist

## 2016-04-27 VITALS — BP 113/64 | HR 90 | Temp 98.4°F | Ht 67.0 in | Wt 146.0 lb

## 2016-04-27 DIAGNOSIS — F1120 Opioid dependence, uncomplicated: Secondary | ICD-10-CM

## 2016-04-27 DIAGNOSIS — M79651 Pain in right thigh: Secondary | ICD-10-CM

## 2016-04-27 DIAGNOSIS — F1121 Opioid dependence, in remission: Secondary | ICD-10-CM

## 2016-04-27 DIAGNOSIS — B182 Chronic viral hepatitis C: Secondary | ICD-10-CM

## 2016-04-27 NOTE — Progress Notes (Signed)
Case discussed with Dr. Svalina at the time of the visit. We reviewed the resident's history and exam and pertinent patient test results. I agree with the assessment, diagnosis, and plan of care documented in the resident's note. 
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
## 2016-04-27 NOTE — Assessment & Plan Note (Signed)
Patient tested positive for Hep C (genotype 1a) during recent hospitalization. She is interested in treatment and referral to [facility]. She has been approved for the orange card.  Plan: --refer to [facility] for Hep C treatment

## 2016-04-27 NOTE — Patient Instructions (Signed)
It was nice meeting you today! Keep up the good work!  I have sent in a referral to the infectious disease clinic; you should be getting a call about setting up an appointment with them.
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
## 2016-04-27 NOTE — Telephone Encounter (Signed)
Called patient assistance program for Suboxone. Patient has been approved for a year. Instructed patient to call program to get billing information for pharmacy.   Hillis RangeEmily A Stewart, PharmD PGY1 Pharmacy Resident

## 2016-05-02 LAB — TOXASSURE SELECT,+ANTIDEPR,UR

## 2016-05-07 ENCOUNTER — Ambulatory Visit: Payer: Self-pay | Admitting: Internal Medicine

## 2016-05-07 ENCOUNTER — Telehealth: Payer: Self-pay | Admitting: Internal Medicine

## 2016-05-07 NOTE — Telephone Encounter (Signed)
[name] missed appointment for Suboxone refill this morning.  Based on review of [place] narcotic database, she is likely out of medication at this time.   I called patient, left voicemail for her to return call and reschedule appointment.

## 2016-05-12 ENCOUNTER — Encounter: Payer: Self-pay | Admitting: Internal Medicine

## 2016-05-12 ENCOUNTER — Ambulatory Visit (INDEPENDENT_AMBULATORY_CARE_PROVIDER_SITE_OTHER): Payer: Self-pay | Admitting: Internal Medicine

## 2016-05-12 VITALS — BP 102/53 | HR 70 | Temp 98.5°F | Ht 67.0 in | Wt 141.7 lb

## 2016-05-12 DIAGNOSIS — F1120 Opioid dependence, uncomplicated: Secondary | ICD-10-CM

## 2016-05-12 DIAGNOSIS — F1121 Opioid dependence, in remission: Secondary | ICD-10-CM

## 2016-05-12 DIAGNOSIS — B182 Chronic viral hepatitis C: Secondary | ICD-10-CM

## 2016-05-12 DIAGNOSIS — B1920 Unspecified viral hepatitis C without hepatic coma: Secondary | ICD-10-CM

## 2016-05-12 MED ORDER — BUPRENORPHINE HCL-NALOXONE HCL 8-2 MG SL FILM: 2.0000 | ORAL_FILM | Freq: Every day | SUBLINGUAL | 0 refills | Status: DC

## 2016-05-12 NOTE — Assessment & Plan Note (Signed)
Patient is doing well with her current Suboxone dose. She did miss her last appointment due to sleeping through her alarm, but denies any relapse or drug use. She was able to rashen her Suboxone films to last until this appointment. She was requesting to push her follow-up appointments back to monthly due to transportation issues. We discussed the importance of keeping regular appointments, and will keep her follow-up every 2 weeks for now. She was recently approved for the Med[employer]ist program through the Suboxone manufacturer. We will continue with current dose. -- Refilled subxone 8 mg films BID x 2 weeks (#28 films) -- Tox Assure obtained today  -- F/u 2 weeks

## 2016-05-12 NOTE — Patient Instructions (Signed)
[name],  It was a pleasure to see you today. I am glad you are doing well! Please continue to use your suboxone as prescribed. I will call you with the results of your blood work today. Please return to clinic in 2 weeks for follow up. If you have any questions or concerns, call our clinic at [phone] or after hours call [phone] and ask for the internal medicine resident on call. Thank you!  - Dr. Antony ContrasGuilloud

## 2016-05-12 NOTE — Assessment & Plan Note (Addendum)
Patient was diagnosed with hep C during recent hospitalization. She was referred to infectious disease at her last appointment, but has not been contacted for an appointment. She was recently approved for the orange card.  -- Follow up referral -- Repeat CMP today

## 2016-05-12 NOTE — Progress Notes (Signed)
   CC: opioid use disorder follow-up  HPI:  Ms.Elowyn D Ivan CroftMcCabe is a 31 y.o. female with past medical history outlined below here for follow up of her opioid use disorder. Patient has a controlled substance contract with the clinic for Suboxone 8mg  BID. Last refill was on 04/22/2016; database is appropriate. Prior UDS's were appropriate; UDS will be obtained today. Unfortunately patient missed her follow up appointment for refill last week. 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 She accidentally fell asleep and slept through her alarm. She reports that she is doing well with her current Suboxone dose. She says that sometimes the Suboxone keeps her up at night and she will occasionally skip her second dose to help her sleep. Because of this, she had a few extra films that it lasted until this appointment. She denies any relapses, or recent drug use. She was referred to ID at her last appointment, but has not been contacted for an appointment. She was recently approved for the orange card. She was also approved for the med assist program through the Suboxone manufacturer. She says that she received her approval letter in the mail last week.  For the details of today's visit, please refer to the assessment and plan.  Past Medical History:  Diagnosis Date  . Acute renal failure (ARF) (HCC) 01/24/2016   Due to rhabdomyolysis, required HD for 10 days then fully recovered  . Moderate opioid use disorder (HCC)   . Rhabdomyolysis 01/24/2016   Traumatic rhabdo following opioid overdose    Review of Systems:  All pertinents listed in HPI, otherwise negative  Physical Exam:  Vitals:   05/12/16 0857  BP: (!) 102/53  Pulse: 70  Temp: 98.5 F (36.9 C)  TempSrc: Oral  SpO2: 98%  Weight: 141 lb 11.2 oz (64.3 kg)  Height: 5\' 7"  (1.702 m)    Constitutional: NAD, appears comfortable Cardiovascular: RRR Pulmonary/Chest: CTAB Abdominal: Soft,  non tender, non distended. +BS.  Extremities: Warm and well perfused. No edema.  Neurological: A&Ox3, CN II - XII grossly intact.   Assessment & Plan:   See Encounters Tab for problem based charting.  Patient seen with Dr. Oswaldo DoneVincent

## 2016-05-12 NOTE — Addendum Note (Signed)
Addended by: Erlinda HongVINCENT, Janellie Tennison T on: 05/12/2016 10:18 AM   Modules accepted: Level of Service

## 2016-05-12 NOTE — Progress Notes (Signed)
Internal Medicine Clinic Attending  I saw and evaluated the patient.  I personally confirmed the key portions of the history and exam documented by Dr. Antony ContrasGuilloud and I reviewed pertinent patient test results.  The assessment, diagnosis, and plan were formulated together and I agree with the documentation in the resident's note.  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 She missed an appointment last week, says she overslept. I re-iterated that good attendance to her appointments is a condition of our controlled medicine contract. I agree with obtaining a urine tox today. She was approved for suboxone assistance through the manufacturer. She has the billing information and should now be able to obtain suboxone for free for one year. I re-iterated to her that she should use one pharmacy in the future, instead of shopping around. Given underlying hep C, we will check LFTs today as buprenorphine can cause elevated AST/ALT rarely. I asked her to follow up with us in two weeks, she was disappointed as she wanted to extend to one month refills, but given the no show last week I think it is too early for that. I gave her a two week refill of Suboxone 8mg  for 2 films daily.

## 2016-05-13 LAB — CMP14 + ANION GAP
A/G RATIO: 1.4 (ref 1.2–2.2)
ALT: 13 IU/L (ref 0–32)
ANION GAP: 19 mmol/L — AB (ref 10.0–18.0)
AST: 16 IU/L (ref 0–40)
Albumin: 4 g/dL (ref 3.5–5.5)
Alkaline Phosphatase: 52 IU/L (ref 39–117)
BUN/Creatinine Ratio: 19 (ref 9–23)
BUN: 15 mg/dL (ref 6–20)
Bilirubin Total: <0.2 mg/dL (ref 0.0–1.2)
CALCIUM: 9.6 mg/dL (ref 8.7–10.2)
CO2: 23 mmol/L (ref 18–29)
Chloride: 101 mmol/L (ref 96–106)
Creatinine, Ser: 0.81 mg/dL (ref 0.57–1.00)
GFR calc Af Amer: 112 mL/min/{1.73_m2} (ref >59)
GFR, EST NON AFRICAN AMERICAN: 97 mL/min/{1.73_m2} (ref >59)
GLOBULIN, TOTAL: 2.8 g/dL (ref 1.5–4.5)
Glucose: 87 mg/dL (ref 65–99)
POTASSIUM: 4.3 mmol/L (ref 3.5–5.2)
Sodium: 143 mmol/L (ref 134–144)
TOTAL PROTEIN: 6.8 g/dL (ref 6.0–8.5)

## 2016-05-15 ENCOUNTER — Encounter: Payer: Self-pay | Admitting: Internal Medicine

## 2016-05-16 LAB — TOXASSURE SELECT,+ANTIDEPR,UR

## 2016-05-25 ENCOUNTER — Telehealth: Payer: Self-pay | Admitting: Internal Medicine

## 2016-05-25 NOTE — Telephone Encounter (Signed)
APT. REMINDER CALL, LMTCB °

## 2016-05-26 ENCOUNTER — Other Ambulatory Visit: Payer: Self-pay | Admitting: Student in an Organized Health Care Education/Training Program

## 2016-05-26 ENCOUNTER — Encounter: Payer: Self-pay | Admitting: Internal Medicine

## 2016-05-26 MED ORDER — BUPRENORPHINE HCL-NALOXONE HCL 8-2 MG SL FILM: 2.0000 | ORAL_FILM | Freq: Every day | SUBLINGUAL | 0 refills | Status: DC

## 2016-05-26 NOTE — Assessment & Plan Note (Deleted)
Alejandra Park is doing well today. Reports symptoms are well controlled on the Suboxone. Her past 4 UDS have been appropriate with most recent UDS on 3/20. Review of the [place] narcotic database shows appropriate refills of her Suboxone. LFTs at last visit were unremarkable. She has no complaints today.   Assessment: Moderate opioid misuse disorder  Plan:  Will continue her Suboxone 8-2 mg bid today with prescription for 1 month.  Obtain UDS at next visit.  Follow up in 1 month

## 2016-05-26 NOTE — Progress Notes (Deleted)
   CC: Opioid Use Disorder Follow Up  HPI:  Alejandra Park is a 31 y.o. female with a past medical history listed below here today for follow up of her opioid use disorder.  For details of today's visit and the status of her chronic medical issues please refer to the assessment and plan.   Past Medical History:  Diagnosis Date  . Acute renal failure (ARF) (HCC) 01/24/2016   Due to rhabdomyolysis, required HD for 10 days then fully recovered  . Moderate opioid use disorder (HCC)   . Rhabdomyolysis 01/24/2016   Traumatic rhabdo following opioid overdose    Review of Systems:   See HPI  Physical Exam:  There were no vitals filed for this visit. ***  Assessment & Plan:   See Encounters Tab for problem based charting.  Patient discussed with Dr. Josem Kaufmann

## 2016-06-01 ENCOUNTER — Encounter: Payer: Self-pay | Admitting: Internal Medicine

## 2016-06-01 ENCOUNTER — Ambulatory Visit (INDEPENDENT_AMBULATORY_CARE_PROVIDER_SITE_OTHER): Payer: Self-pay | Admitting: Internal Medicine

## 2016-06-01 ENCOUNTER — Other Ambulatory Visit: Payer: Self-pay | Admitting: Student in an Organized Health Care Education/Training Program

## 2016-06-01 ENCOUNTER — Encounter (INDEPENDENT_AMBULATORY_CARE_PROVIDER_SITE_OTHER): Payer: Self-pay

## 2016-06-01 DIAGNOSIS — F1121 Opioid dependence, in remission: Secondary | ICD-10-CM

## 2016-06-01 DIAGNOSIS — F1120 Opioid dependence, uncomplicated: Secondary | ICD-10-CM

## 2016-06-01 MED ORDER — BUPRENORPHINE HCL-NALOXONE HCL 8-2 MG SL FILM: 2.0000 | ORAL_FILM | Freq: Every day | SUBLINGUAL | 0 refills | Status: DC

## 2016-06-01 NOTE — Progress Notes (Signed)
This encounter was created in error - please disregard.

## 2016-06-01 NOTE — Assessment & Plan Note (Addendum)
Alejandra Park is doing well today. She missed her appointment last week and presents today for follow up. Reports that she missed the appointment last week because her ride cancelled on her and she couldn't make it to the clinic. Says that she has been doing well on the Suboxone and symptoms are well controlled. Was not able to make it to today's appointment without running out but says she borrowed some from a friend. Past 4 UDS tests have been appropriate with most recent on 3/20. LFTs at last visit were unremarkable. Has been going to NA and AA meetings weekly. Reports no elicit drug use or alcohol use. Reports no complaints today.   Assessment: Moderate opioid misuse disorder  Plan:  Will continue her Suboxone 8-2 mg bid today with prescription for 2 weeks.  Obtain UDS today. Follow up in 2 weeks.

## 2016-06-01 NOTE — Progress Notes (Signed)
   CC: Opioid use disorder  HPI:  Alejandra Park is a 31 y.o. female with a past medical history listed below here today for follow up of her opioid use disorder.  For details of today's visit and the status of her chronic medical issues please refer to the assessment and plan.    Past Medical History:  Diagnosis Date  . Acute renal failure (ARF) (HCC) 01/24/2016   Due to rhabdomyolysis, required HD for 10 days then fully recovered  . Moderate opioid use disorder (HCC)   . Rhabdomyolysis 01/24/2016   Traumatic rhabdo following opioid overdose    Review of Systems:   See HPI  Physical Exam:  There were no vitals filed for this visit. Physical Exam  Constitutional: She is well-developed, well-nourished, and in no distress.  Cardiovascular: Normal rate and regular rhythm.   Pulmonary/Chest: Effort normal and breath sounds normal.  Skin: Skin is warm and dry.  Vitals reviewed.    Assessment & Plan:   See Encounters Tab for problem based charting.  Patient discussed with Dr. Josem Kaufmann

## 2016-06-01 NOTE — Patient Instructions (Signed)
Ms. Schum,  Please follow up in clinic in 2 weeks

## 2016-06-02 ENCOUNTER — Encounter: Payer: Self-pay | Admitting: Internal Medicine

## 2016-06-02 NOTE — Progress Notes (Signed)
Case discussed with Dr. Boswell at the time of the visit.  We reviewed the resident's history and exam and pertinent patient test results.  I agree with the assessment, diagnosis and plan of care documented in the resident's note. 

## 2016-06-10 LAB — TOXASSURE SELECT,+ANTIDEPR,UR

## 2016-06-15 ENCOUNTER — Ambulatory Visit (INDEPENDENT_AMBULATORY_CARE_PROVIDER_SITE_OTHER): Payer: Self-pay | Admitting: Internal Medicine

## 2016-06-15 ENCOUNTER — Other Ambulatory Visit: Payer: Self-pay | Admitting: Student in an Organized Health Care Education/Training Program

## 2016-06-15 ENCOUNTER — Encounter: Payer: Self-pay | Admitting: Internal Medicine

## 2016-06-15 VITALS — BP 113/64 | HR 82 | Temp 98.3°F | Ht 67.0 in | Wt 133.4 lb

## 2016-06-15 DIAGNOSIS — F1120 Opioid dependence, uncomplicated: Secondary | ICD-10-CM

## 2016-06-15 DIAGNOSIS — B182 Chronic viral hepatitis C: Secondary | ICD-10-CM

## 2016-06-15 DIAGNOSIS — F1121 Opioid dependence, in remission: Secondary | ICD-10-CM

## 2016-06-15 MED ORDER — BUPRENORPHINE HCL-NALOXONE HCL 8-2 MG SL FILM: 2.0000 | ORAL_FILM | Freq: Every day | SUBLINGUAL | 0 refills | Status: DC

## 2016-06-15 NOTE — Patient Instructions (Signed)
[name],  I am glad you are doing well. I am giving you a prescription for the Suboxone for a month supply today.   Your referral to the [facility] for Infectious Disease has been approved you just need to contact them and schedule an appointment. Please contact them and arrange for an appointment to be treated for the Hepatitis C.   Address: [address], [place], [place] [address]  Phone: [phone]   Please follow up in clinic in 1 month.

## 2016-06-15 NOTE — Assessment & Plan Note (Signed)
Referral has been accepted. [facility] has mailed her information. She reports seeing something in the mail but has not opened it yet. Provided her with their information and instructions to call and schedule an appointment to be seen. She voiced understanding.

## 2016-06-15 NOTE — Progress Notes (Signed)
Internal Medicine Clinic Attending  Case discussed with Dr. Boswell at the time of the visit.  We reviewed the resident's history and exam and pertinent patient test results.  I agree with the assessment, diagnosis, and plan of care documented in the resident's note.  

## 2016-06-15 NOTE — Assessment & Plan Note (Addendum)
Alejandra Park has no complaints today. Reports she is doing well on the Suboxone. Denies any illicit drug use. UDS at last visit was again appropriate. Reports that she will be starting a new program for her substance abuse that is meeting MWF. Starts next week.   Plan: Will provide Rx for Suboxone 8-2 mg bid for 1 month supply UDS at next visit Follow up in 1 month

## 2016-06-15 NOTE — Progress Notes (Signed)
   CC: Opioid Abuse Disorder follow up  HPI:  [name] is a 31 y.o. female with a past medical history listed below here today for follow up of her opioid use disorder.  For details of today's visit and the status of her chronic medical issues please refer to the assessment and plan.   Past Medical History:  Diagnosis Date  . Acute renal failure (ARF) (HCC) 01/24/2016   Due to rhabdomyolysis, required HD for 10 days then fully recovered  . Moderate opioid use disorder (HCC)   . Rhabdomyolysis 01/24/2016   Traumatic rhabdo following opioid overdose    Review of Systems:   See HPI  Physical Exam:  Vitals:   06/15/16 1620  BP: 113/64  Pulse: 82  Temp: 98.3 F (36.8 C)  TempSrc: Oral  SpO2: 94%  Weight: 133 lb 6.4 oz (60.5 kg)  Height:  (1.702 m)   Physical Exam  Constitutional: She is well-developed, well-nourished, and in no distress.  Cardiovascular: Normal rate and regular rhythm.   Pulmonary/Chest: Effort normal and breath sounds normal.  Vitals reviewed.    Assessment & Plan:   See Encounters Tab for problem based charting.  Patient discussed with Dr. Rogelia Boga

## 2016-06-16 ENCOUNTER — Telehealth: Payer: Self-pay | Admitting: *Deleted

## 2016-06-16 NOTE — Telephone Encounter (Signed)
CALLED PATIENT UNABLE TO LEAVE MESSAGE DUE TO VOICE MAIL FULL.

## 2016-06-17 ENCOUNTER — Telehealth: Payer: Self-pay | Admitting: *Deleted

## 2016-06-17 NOTE — Telephone Encounter (Signed)
CALLED PATIENT, UNABLE TO LEAVE MESSAGE DUE TO MAIL BOX FULL. CALLED HER CONTACT PERSON CYNTHIA. LEFT VOICE MESSAGE ASKING HER TO PLEASE HAVE [facility] GIVE THE CLINIC A CALL [phone].    NEED TO KNOW IF PATIENT REFUSED THE ID REFERRAL WHEN SHE WAS CONTACTED BY THEIR OFFICE / DO SHE OR NOT WANT THIS REFERRAL.

## 2016-07-06 ENCOUNTER — Encounter: Payer: Self-pay | Admitting: Internal Medicine

## 2016-07-15 ENCOUNTER — Ambulatory Visit (INDEPENDENT_AMBULATORY_CARE_PROVIDER_SITE_OTHER): Payer: Self-pay | Admitting: Internal Medicine

## 2016-07-15 VITALS — BP 98/59 | HR 93 | Temp 98.2°F | Ht 66.0 in | Wt 128.3 lb

## 2016-07-15 DIAGNOSIS — F1121 Opioid dependence, in remission: Secondary | ICD-10-CM

## 2016-07-15 DIAGNOSIS — B182 Chronic viral hepatitis C: Secondary | ICD-10-CM

## 2016-07-15 DIAGNOSIS — Z0000 Encounter for general adult medical examination without abnormal findings: Secondary | ICD-10-CM

## 2016-07-15 DIAGNOSIS — F1120 Opioid dependence, uncomplicated: Secondary | ICD-10-CM

## 2016-07-15 MED ORDER — BUPRENORPHINE HCL-NALOXONE HCL 8-2 MG SL FILM: 2.0000 | ORAL_FILM | Freq: Every day | SUBLINGUAL | 0 refills | Status: DC

## 2016-07-15 NOTE — Assessment & Plan Note (Addendum)
Patient presents today for ongoing management of her opioid use disorder. Prior obtained UDS's have been appropriate. She denies using any illicit substances. She states she is taking Suboxone as prescribed. It is helping with her cravings. She is not having any constipation. She is having no side effects. We'll provide prescription for 1 month and have her return to clinic. -- UDS -- Suboxone for 1 month supply -- Return to clinic in one month for ongoing management

## 2016-07-15 NOTE — Patient Instructions (Signed)
It was a pleasure seeing you today. [facility].   I will make a referral for you to dentistry.   You may call the following number to make an appointment with the infectious disease clinic: [phone]. Currently, he cannot have an appointment scheduled. I recommend calling this number and scheduling an appointment to be seen.  Please return to clinic in 1 month.

## 2016-07-15 NOTE — Assessment & Plan Note (Signed)
Patient requesting referral to dentistry. [facility] This will be important in a patient on Suboxone therapy. We'll make referral. -- Referral for dentistry

## 2016-07-15 NOTE — Progress Notes (Signed)
Internal Medicine Clinic Attending  I saw and evaluated the patient.  I personally confirmed the key portions of the history and exam documented by Dr. Ladona Ridgelaylor and I reviewed pertinent patient test results.  The assessment, diagnosis, and plan were formulated together and I agree with the documentation in the resident's note.  Patient with moderate opioid use disorder who has been [facility]. She is doing well, we just moved her to once monthly appointments. She denies cravings, denies drug use, reports good compliance. [place] shows appropriate dispensing. Urine tox was obtained today, last one in April was appropriate. I provided her with a one month supply of Suboxone 2 films daily, follow up with us in one month for refill. We talked about her need to present to [facility] for consultation about treating chronic hepatitis C infection. 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

## 2016-07-15 NOTE — Addendum Note (Signed)
Addended by: Kathleen ArgueAYLOR, Camy Leder M on: 07/15/2016 02:09 PM   Modules accepted: Orders

## 2016-07-15 NOTE — Progress Notes (Signed)
   CC: Opioid use disorder  HPI: [name] is a 31 y.o. female with a past medical history as listed below who presents for f/u of opioid use disorder.   Past Medical History:  Diagnosis Date  . Acute renal failure (ARF) (HCC) 01/24/2016   Due to rhabdomyolysis, required HD for 10 days then fully recovered  . Moderate opioid use disorder (HCC)   . Rhabdomyolysis 01/24/2016   Traumatic rhabdo following opioid overdose     Review of Systems: Denies chest pain shortness of breath. Has nausea, vomiting or abdominal pain. Denies constipation. Physical Exam: There were no vitals filed for this visit. BP (!) 98/59 (BP Location: Left Arm, Patient Position: Sitting, Cuff Size: Normal)   Pulse 93   Temp 98.2 F (36.8 C) (Oral)   Ht 5\' 6"  (1.676 m)   Wt 128 lb 4.8 oz (58.2 kg)   LMP 07/06/2016 (Approximate)   SpO2 99%   BMI 20.71 kg/m  General appearance: alert and cooperative Head: Normocephalic, without obvious abnormality, atraumatic Lungs: clear to auscultation bilaterally Heart: regular rate and rhythm, S1, S2 normal, no murmur, click, rub or gallop Abdomen: soft, non-tender; bowel sounds normal; no masses,  no organomegaly  Assessment & Plan:  See encounters tab for problem based medical decision making. Patient seen with Dr. Oswaldo DoneVincent  Signed: Thomasene Lotaylor, [place], MD 07/15/2016, 1:28 PM  Pager: [phone]

## 2016-07-22 LAB — TOXASSURE SELECT,+ANTIDEPR,UR

## 2016-08-12 ENCOUNTER — Encounter: Payer: Self-pay | Admitting: Internal Medicine

## 2016-08-24 ENCOUNTER — Ambulatory Visit: Payer: Self-pay

## 2016-09-01 ENCOUNTER — Ambulatory Visit (INDEPENDENT_AMBULATORY_CARE_PROVIDER_SITE_OTHER): Payer: Self-pay | Admitting: Student in an Organized Health Care Education/Training Program

## 2016-09-01 VITALS — BP 95/56 | HR 98 | Temp 98.3°F | Resp 18 | Wt 131.0 lb

## 2016-09-01 DIAGNOSIS — F1120 Opioid dependence, uncomplicated: Secondary | ICD-10-CM

## 2016-09-01 DIAGNOSIS — F1121 Opioid dependence, in remission: Secondary | ICD-10-CM

## 2016-09-01 DIAGNOSIS — B182 Chronic viral hepatitis C: Secondary | ICD-10-CM

## 2016-09-01 MED ORDER — BUPRENORPHINE HCL-NALOXONE HCL 8-2 MG SL FILM: 2.0000 | ORAL_FILM | Freq: Every day | SUBLINGUAL | 0 refills | Status: DC

## 2016-09-01 NOTE — Patient Instructions (Signed)
1. Keep up the great work with your suboxone.   2. Come see us again within one month on a Tuesday morning, which is the time that we have set aside to see suboxone patients.   3. Call the ID clinic to ask for a new appointment. It is critical for your health that you treat your hepatitis C now before it becomes a problem.

## 2016-09-01 NOTE — Progress Notes (Signed)
   09/01/2016  Alejandra Park presents for buprenorphine/naloxone follow up visit.  I have reviewed the prior induction visit, follow up visits, and telephone encounters relevant to opiate use disorder (OUD) treatment.   Current daily dose: Suboxone 16mg  daily  Induction Date: 02/14/16  Current follow up interval, in weeks: 4  The patient has been adherent to the buprenorphine for OUD contract: Yes, with minor lapses.  UDS: today  HPI: Alejandra Park is a 31 year old woman with moderate opioid use disorder is [facility]. She reports doing very well. Her last visit with us was on May 23. She missed to appointment with us in early July, there was miscommunication and confusion about starting Suboxone prescribing only on Tuesday mornings. She ran out of described Suboxone in late June, reported that she started to feel some sensation of withdrawal. She treated this by purchasing Suboxone from her friend. Says that it was the same dose of 28 mg films daily. Currently has no withdrawal, cravings for heroin are under control as well. Denies use of any heroin or other illicits. Says she did smoke marijuana a few weeks ago while at the beach.  Currently she is undergoing court ordered substance use counseling through the [facility]. She is required to complete 90 hours as result of a DWI conviction. She says that it's mostly a group session, 3 days a week for 3 hours. She reports finding minimal benefit to that kind of group counseling. She works at night at a club, reports that she has the potential to gain health insurance when open enrollment starts at the end of the year. She continues to live with family, mostly her mother. She reports having difficulty finding a house or apartment that will rent in her name because of her background.  Exam:   Vitals:   09/01/16 0835  BP: (!) 95/56  Pulse: 98  Resp: 18  Temp: 98.3 F (36.8 C)  TempSrc: Oral    SpO2: 100%  Weight: 131 lb (59.4 kg)   Gen: well appearing woman, comfortable, interactive CV: RRR, no murmurs Lungs: clear throughout Ext: Warm, no edema, normal joints   Assessment/Plan: See Problem Based Charting  Medication Management - Patient is doing well on current daily dose, which is continued today.  UDS ordered.    Follow up interval: every 4 weeks  Tyson AliasVincent, Aar[place]i Redditt Thomas, MD  09/01/2016  8:59 AM

## 2016-09-01 NOTE — Assessment & Plan Note (Signed)
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 She missed her first appointment with [facility] in June. She understands that this is a treatable infection, I counseled her to call the ID clinic to see if they will give her another chance at a new appointment.

## 2016-09-01 NOTE — Assessment & Plan Note (Signed)
Doing well on treatment with Suboxone since December 2017. She reports complete abstinence from heroin. She is engaging with our treatment, currently being seen once monthly. U tox has been appropriate consistently, another was collected today. I reviewed the database which shows appropriate dispensing. Plan is to continue with Suboxone 16mg  daily, I dispensed #60 for a one month supply. Follow up with us in OUD clinic.

## 2016-09-05 LAB — TOXASSURE SELECT,+ANTIDEPR,UR

## 2016-09-22 ENCOUNTER — Ambulatory Visit: Payer: Self-pay

## 2016-09-29 ENCOUNTER — Ambulatory Visit: Payer: Self-pay

## 2016-09-29 ENCOUNTER — Ambulatory Visit (INDEPENDENT_AMBULATORY_CARE_PROVIDER_SITE_OTHER): Payer: Self-pay | Admitting: Student in an Organized Health Care Education/Training Program

## 2016-09-29 VITALS — BP 112/59 | HR 85 | Temp 98.2°F | Resp 20 | Ht 67.0 in | Wt 133.2 lb

## 2016-09-29 DIAGNOSIS — Z79891 Long term (current) use of opiate analgesic: Secondary | ICD-10-CM

## 2016-09-29 DIAGNOSIS — F17200 Nicotine dependence, unspecified, uncomplicated: Secondary | ICD-10-CM

## 2016-09-29 DIAGNOSIS — F1121 Opioid dependence, in remission: Secondary | ICD-10-CM

## 2016-09-29 MED ORDER — BUPRENORPHINE HCL-NALOXONE HCL 8-2 MG SL FILM: 2.0000 | ORAL_FILM | Freq: Every day | SUBLINGUAL | 0 refills | Status: DC

## 2016-09-29 NOTE — Progress Notes (Signed)
   09/29/2016  Alejandra Park presents for follow up of opioid use disorder I have reviewed the prior induction visit, follow up visits, and telephone encounters relevant to opiate use disorder (OUD) treatment.   Current daily dose: Suboxone 16 mg daily  Date of Induction: 02/14/16  Current follow up interval, in weeks: 4  The patient has been adherent with the buprenorphine for OUD contract.   Last UDS Result: Appropriate  HPI: 31 year old woman with moderate opioid use disorder is here for follow-up of medication assisted treatments. Patient is doing very well at home, she is stable. Denies any recent relapses. Has been sober for over 6 months. She continues to go to court direct did group therapy, but she finds this to be unhelpful. She is living with family, working intermittently. Wants to go back to school. No recent emergency department visits, hospitalizations. She tried follow-up with the infectious disease clinic, and asked her to call back because of her prior no-show. No fevers or chills. Abstaining from alcohol. No other complaints today.  Exam:   Vitals:   09/29/16 0849  BP: (!) 112/59  Pulse: 85  Resp: 20  Temp: 98.2 F (36.8 C)  TempSrc: Oral  Weight: 133 lb 3.2 oz (60.4 kg)  Height: 5\' 7"  (1.702 m)    Gen: Well-appearing, no distress ENT: Moist beaks membranes, normal dentition. CV: Regular rate and rhythm with no murmurs Lungs: Clear throughout no wheezing Extremities: Warm and well-perfused with normal joints.  Assessment/Plan:  See Problem Based Charting in the Encounters Tab   Tyson AliasVincent, Duncan Thomas, MD  09/29/2016  12:59 PM

## 2016-09-29 NOTE — Assessment & Plan Note (Signed)
Moderate opioid use disorder currently stable on medication assisted therapy with Suboxone. She is doing very well, urine tox screens have been appropriate. I reviewed the database today which shows appropriate dispensing. Tox screen obtained today, likely in the future we will need to obtain a tox screen at every visit as she is doing very well. Plan is to continue with buprenorphine 16 mg daily, I gave her a one-month supply today. Follow-up in one month.

## 2016-09-29 NOTE — Patient Instructions (Signed)
1. Keep up the great work and come back in one month for your refill.

## 2016-10-02 LAB — TOXASSURE SELECT,+ANTIDEPR,UR

## 2016-10-27 ENCOUNTER — Ambulatory Visit (INDEPENDENT_AMBULATORY_CARE_PROVIDER_SITE_OTHER): Payer: Self-pay | Admitting: Internal Medicine

## 2016-10-27 VITALS — BP 110/63 | HR 76 | Temp 98.2°F | Ht 67.0 in | Wt 138.9 lb

## 2016-10-27 DIAGNOSIS — F1121 Opioid dependence, in remission: Secondary | ICD-10-CM

## 2016-10-27 MED ORDER — BUPRENORPHINE HCL-NALOXONE HCL 8-2 MG SL FILM: 2.0000 | ORAL_FILM | Freq: Every day | SUBLINGUAL | 0 refills | Status: DC

## 2016-10-27 NOTE — Assessment & Plan Note (Signed)
31 year old F here for follow up of her moderate opioid use disorder. She is currently managed on 16 mg of buprenorphine daily, which she is doing very well on. She denies any recent relapse, cravings, or withdrawal symptoms. She reports side effect of insomnia when she splits her dose 8 mg BID, but does well when she takes 16 mg single dose in the morning. She is accompanied today by her daughter. She appears well and has no complaints. [place] controlled substance database was reviewed today and appropriate. Last UDS results reviewed and appropriate for buprenorphine.  -- Continue Suboxone 8-2 mg, 2 films once daily (1 month provided) -- F/u 1 month -- UDS at next visit

## 2016-10-27 NOTE — Progress Notes (Signed)
   10/27/2016  Alejandra Ferronebecca D Skog presents for follow up of opioid use disorder I have reviewed the prior induction visit, follow up visits, and telephone encounters relevant to opiate use disorder (OUD) treatment.   Current daily dose: 16 mg buprenorphine  Date of Induction: 02/14/16  Current follow up interval, in weeks: 4  The patient has been adherent with the buprenorphine for OUD contract.   Last UDS Result: Appropriate for buprenorphine and norbuprenorphine   HPI: 31 year old F here for follow up of her moderate opioid use disorder. She is currently managed on 16 mg of buprenorphine daily, which she is doing very well on. She denies any relapse, cravings, or withdrawal symptoms since her last visit. She reports side effect of insomnia when she splits her dose 8 mg BID, but does well when she takes 16 mg single dose in the morning. She is accompanied today by her daughter. She appears well and has no complaints. [place] controlled substance database was reviewed today and appropriate. Last UDS results reviewed and appropriate for buprenorphine.   Exam:   Vitals:   10/27/16 0940  BP: 110/63  Pulse: 76  Temp: 98.2 F (36.8 C)  TempSrc: Oral  SpO2: 100%  Weight: 138 lb 14.4 oz (63 kg)  Height: 5\' 7"  (1.702 m)    Physical Exam Constitutional: NAD, appears comfortable HEENT: Normal oropharynx  Cardiovascular: RRR Pulmonary/Chest: CTAB Extremities: Warm and well perfused. No edema.  Skin: No rashes or erythema  Psychiatric: Normal mood and affect   Assessment/Plan:  See Problem Based Charting in the Encounters Tab  Reymundo PollGuilloud, Seith Aikey, MD  10/27/2016  9:42 AM

## 2016-10-27 NOTE — Patient Instructions (Signed)
[name],  It was a pleasure to see you today. I am glad you are doing well! Please continue to take your medication as previously prescribed. Please follow up with us in 1 month. If you have any questions or concerns, call our clinic at [phone] or after hours call [phone] and ask for the internal medicine resident on call. Thank you!  - Dr. Antony ContrasGuilloud

## 2016-10-29 NOTE — Progress Notes (Signed)
Internal Medicine Clinic Attending  I saw and evaluated the patient.  I personally confirmed the key portions of the history and exam documented by Dr. Antony ContrasGuilloud and I reviewed pertinent patient test results.  The assessment, diagnosis, and plan were formulated together and I agree with the documentation in the resident's note.  Alejandra Park is a woman with moderate OUD who has been in treatment in our clinic for ~ 9 months.  She is doing very well.  Has no complaints today.  She notes that splitting her buprenorphine into BID dosing causes some insomnia and she is taking it once per day.  She has no cravings.  UDS has been appropriate > 1 month and we gave her a holiday today.  UDS at next visit.  Consider lengthening to q 2 month visits and or weaning medication if appropriate in future.  Follow up in 1 month.

## 2016-11-24 ENCOUNTER — Other Ambulatory Visit: Payer: Self-pay

## 2016-11-24 NOTE — Telephone Encounter (Signed)
I am in [facility] this month and am happy to see her if either Dr. Criselda Peaches or Dr. Oswaldo Done are available to sign the prescription. Thanks!

## 2016-11-24 NOTE — Telephone Encounter (Signed)
Buprenorphine HCl-Naloxone HCl (SUBOXONE) 8-2 MG FILM, Refill request.

## 2016-11-25 NOTE — Telephone Encounter (Signed)
I think she does have to come in.  She should come in to [facility] for evaluation.  She needs UDS as she had a holiday at last visit. I am available every day but Friday to do an Rx if needed.

## 2016-11-25 NOTE — Telephone Encounter (Signed)
Scheduled appt thurs 10/4 at 1400 [facility]

## 2016-11-26 ENCOUNTER — Encounter (INDEPENDENT_AMBULATORY_CARE_PROVIDER_SITE_OTHER): Payer: Self-pay

## 2016-11-26 ENCOUNTER — Encounter: Payer: Self-pay | Admitting: Internal Medicine

## 2016-11-26 ENCOUNTER — Ambulatory Visit (INDEPENDENT_AMBULATORY_CARE_PROVIDER_SITE_OTHER): Payer: Self-pay | Admitting: Internal Medicine

## 2016-11-26 VITALS — BP 107/64 | HR 73 | Temp 98.3°F | Ht 67.0 in | Wt 139.8 lb

## 2016-11-26 DIAGNOSIS — F4320 Adjustment disorder, unspecified: Secondary | ICD-10-CM | POA: Insufficient documentation

## 2016-11-26 DIAGNOSIS — F1121 Opioid dependence, in remission: Secondary | ICD-10-CM

## 2016-11-26 MED ORDER — BUPRENORPHINE HCL-NALOXONE HCL 8-2 MG SL FILM: ORAL_FILM | SUBLINGUAL | 0 refills | Status: DC

## 2016-11-26 NOTE — Patient Instructions (Addendum)
Ms. Searing,  It was a pleasure to see you today. We have increased your suboxone dose to 2 and 1/2 films a day. Please follow up with [place] in 1 month. If you have any questions or concerns, call our clinic at [phone] or after hours call [phone] and ask for the internal medicine resident on call. Thank you!  - Dr. Antony Contras
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
## 2016-11-26 NOTE — Assessment & Plan Note (Addendum)
31 yo F here for follow up of her moderate opioid use disorder. She is currently managed on 16 mg of buprenorphine daily which she takes once in the morning. She was previously doing well on this regimen but recently feels that the medicine is wearing off in the evenings around 6pm. She has tried splitting up her dosing as BID and no longer reports side effects of insomnia, but endorsed increase in day time cravings with split dosing. She denies relapse and illicit substance use. Today we discussed adding a 1/2 film in the evenings to her regimen to help with cravings. She is agreeable to this plan.  -- Increase dose to 20 mg buprenorphine daily (2 films in AM, 1/2 film in evening)  -- Provided prescription Suboxone 8-2 mg (75 total) -- F/u UDS -- [place] database reviewed and refill history is appropriate  -- Follow up 1 month   ADDENDUM: UDS inappropriate for alcohol and cocaine metabolites. Appropriate for buprenorphine and metabolite. Reported history of recent increase in alcohol use in the setting of her stress reaction may be more severe than she is letting on. Follow up with patient in 1 month.

## 2016-12-01 NOTE — Progress Notes (Signed)
Internal Medicine Clinic Attending  I saw and evaluated the patient.  I personally confirmed the key portions of the history and exam documented by Dr. Antony Contras and I reviewed pertinent patient test results.  The assessment, diagnosis, and plan were formulated together and I agree with the documentation in the resident's note.  Alejandra Park is overall doing well with her OUD.  She has had significant stressors in her life, using ETOH to cope and Dr. Antony Contras and she had a good discussion about other coping mechanisms today.  She is having wear off effect which is new for her, but given her increased stress I think at least in the short term a trial of higher dose of buprenorphine is appropriate.  Reassess at next visit.  She is looking into getting insurance to ensure continued care after her 1 year free buprenorphine runs out from the manufacturer.  Follow up in 1 month.

## 2016-12-03 LAB — TOXASSURE SELECT,+ANTIDEPR,UR

## 2017-01-06 ENCOUNTER — Other Ambulatory Visit: Payer: Self-pay | Admitting: *Deleted

## 2017-01-06 MED ORDER — BUPRENORPHINE HCL-NALOXONE HCL 8-2 MG SL FILM: ORAL_FILM | SUBLINGUAL | 0 refills | Status: DC

## 2017-01-06 NOTE — Telephone Encounter (Signed)
Thank you. Yes, lets call in a two week supply. We will need to see her at our next OUD clinic to assess how she is doing and collect a UDS before approving any further suboxone refills.

## 2017-01-06 NOTE — Telephone Encounter (Signed)
Pt calls and states she missed her appt due to being incarcerated for a DUI. Since she has been out of town visiting her daughter. She has been out of suboxone for several days. She states she cannot come to clinic in the next few days. Spoke w/ dr Oswaldo Donevincent, will call 35 films to pharm for pt and schedule her for OUD 11/27 at 1045 Attempted to call pt back, no answer and vmail full, will try to call again, pt is traveling and ph may be out of range

## 2017-01-08 NOTE — Telephone Encounter (Signed)
I had called pt several times since our conversation 11/14, to inform her of appt and script, this am she answered , she seemed unsure of discussion and stated her phaone had not been working well, told her of her appt and recalled [facility] which was not on her profile, I have changed her pharm profile to reflect [facility], will mail her an appt reminder, [facility]ist stated they had called to remind her she had a script to pic up.

## 2017-01-19 ENCOUNTER — Ambulatory Visit: Payer: Self-pay

## 2017-01-19 ENCOUNTER — Other Ambulatory Visit: Payer: Self-pay | Admitting: *Deleted

## 2017-01-19 DIAGNOSIS — F1121 Opioid dependence, in remission: Secondary | ICD-10-CM

## 2017-01-19 NOTE — Progress Notes (Deleted)
   01/19/2017  Alejandra Ferronebecca D Park presents for follow up of opioid use disorder I have reviewed the prior induction visit, follow up visits, and telephone encounters relevant to opiate use disorder (OUD) treatment.   Current daily dose: ***  Date of Induction: ***  Current follow up interval, in weeks: ***  The patient {Has/has not:18111} been adherent with the buprenorphine for OUD contract.   Last UDS Result: ***  HPI: *** Uds, family, alcohol  Exam:   There were no vitals filed for this visit.  ***  Assessment/Plan:  See Problem Based Charting in the Encounters Tab     Camelia PhenesHoffman, Tyrome Donatelli Ratliff, DO  01/19/2017  8:42 AM

## 2017-01-20 ENCOUNTER — Ambulatory Visit (INDEPENDENT_AMBULATORY_CARE_PROVIDER_SITE_OTHER): Payer: Self-pay | Admitting: Internal Medicine

## 2017-01-20 DIAGNOSIS — F1121 Opioid dependence, in remission: Secondary | ICD-10-CM

## 2017-01-20 DIAGNOSIS — F1490 Cocaine use, unspecified, uncomplicated: Secondary | ICD-10-CM

## 2017-01-20 DIAGNOSIS — J069 Acute upper respiratory infection, unspecified: Secondary | ICD-10-CM

## 2017-01-20 DIAGNOSIS — F1120 Opioid dependence, uncomplicated: Secondary | ICD-10-CM

## 2017-01-20 DIAGNOSIS — F17210 Nicotine dependence, cigarettes, uncomplicated: Secondary | ICD-10-CM

## 2017-01-20 MED ORDER — BUPRENORPHINE HCL-NALOXONE HCL 8-2 MG SL FILM: ORAL_FILM | SUBLINGUAL | 0 refills | Status: DC

## 2017-01-20 NOTE — Progress Notes (Signed)
   CC: headache, runny nose  HPI:  [name] is a 31 y.o. female who presents today for evaluation of a rhinorrhea, lymphadenopathy in the neck, mild headache, dry cough, sore-throat. Treating with throat lozenges, DayQuil,  Denied fever, chills, nausea, vomiting, abdominal pain, chest pain, diarrhea, muscle aches.   Most consistent with a viral URI.  Plan:  Continue to remain hydrated, instructed patient to monitor urine output to remain hydrated. DayQUil PRN, lozenges   Refill was given on her suboxone.  Past Medical History:  Diagnosis Date  . Acute renal failure (ARF) (HCC) 01/24/2016   Due to rhabdomyolysis, required HD for 10 days then fully recovered  . History of abnormal cervical Pap smear 03/20/2016  . Moderate opioid use disorder (HCC)   . Rhabdomyolysis 01/24/2016   Traumatic rhabdo following opioid overdose   Review of Systems:  ROS negative except as per HPI.  Physical Exam:  There were no vitals filed for this visit. Physical Exam  Constitutional: She appears well-nourished. No distress.  Cardiovascular: Normal rate and regular rhythm.  No murmur heard. Pulmonary/Chest: Effort normal and breath sounds normal. No respiratory distress.  Abdominal: Soft. Bowel sounds are normal. She exhibits no distension. There is no tenderness.  Musculoskeletal: She exhibits no edema or tenderness.  Vitals reviewed.   Assessment & Plan:   See Encounters Tab for problem based charting.  Patient seen with Dr. Cleda DaubE. Hoffman

## 2017-01-20 NOTE — Assessment & Plan Note (Signed)
[name] is a 31 y.o. female who presents today for evaluation of a rhinorrhea, lymphadenopathy in the neck, mild headache, dry cough, sore-throat. Treating with throat lozenges, DayQuil,  Denied fever, chills, nausea, vomiting, abdominal pain, chest pain, diarrhea, muscle aches.   Most consistent with a viral URI.  Plan:  Continue to remain hydrated, instructed patient to monitor urine output to remain hydrated. DayQUil PRN, lozenges

## 2017-01-20 NOTE — Assessment & Plan Note (Signed)
Assessment: Patient taking her medication, Has used cocaine once this past weekend. Believes she is stable on this dose but has required the additional 2mg  over the 8mg  BID strips daily to control cravings  Plan: Continue the current regimen and return in two weeks for evaluation in the regular OUD clinic.

## 2017-01-20 NOTE — Patient Instructions (Addendum)
FOLLOW-UP INSTRUCTIONS When: in two weeks For: Evaluation of your symptoms What to bring:   [facility]  Please continue to stay hydrated, monitor your urine output and fluid intake regularly. Continue the care you described in your visit but if symptoms persist for greater than 2-3 weeks let us know.

## 2017-01-25 LAB — TOXASSURE SELECT,+ANTIDEPR,UR

## 2017-01-25 NOTE — Progress Notes (Signed)
Internal Medicine Clinic Attending  I saw and evaluated the patient.  I personally confirmed the key portions of the history and exam documented by Dr. Crista ElliotHarbrecht and I reviewed pertinent patient test results.  The assessment, diagnosis, and plan were formulated together and I agree with the documentation in the resident's note. [name] is having rhinorrhea, and URI symptoms, treating supportively.  Reports continued abstinence from opioid medication however admits using cocaine x1 last weekend.  Will continue her current suboxone Rx and have her back to follow up with Dr Oswaldo DoneVincent in 2 weeks.

## 2017-01-29 ENCOUNTER — Telehealth: Payer: Self-pay | Admitting: Internal Medicine

## 2017-01-29 ENCOUNTER — Other Ambulatory Visit: Payer: Self-pay | Admitting: *Deleted

## 2017-01-29 MED ORDER — BUPRENORPHINE HCL-NALOXONE HCL 8-2 MG SL FILM: ORAL_FILM | SUBLINGUAL | 0 refills | Status: DC

## 2017-01-29 NOTE — Telephone Encounter (Signed)
Patient is checking on medicine °

## 2017-01-29 NOTE — Telephone Encounter (Signed)
[facility] [place] [facility] appt oud

## 2017-01-29 NOTE — Telephone Encounter (Signed)
Called pt, scheduled appt [facility] med [facility] [place] [address]

## 2017-02-10 ENCOUNTER — Telehealth: Payer: Self-pay | Admitting: *Deleted

## 2017-02-10 NOTE — Telephone Encounter (Signed)
Attempted to call pt to reschedule missed appt, no answer, no vmail

## 2017-02-19 ENCOUNTER — Emergency Department (HOSPITAL_COMMUNITY): Payer: Self-pay

## 2017-02-19 ENCOUNTER — Emergency Department (HOSPITAL_COMMUNITY)
Admission: EM | Admit: 2017-02-19 | Discharge: 2017-02-20 | Disposition: A | Discharge status: Home / Self Care | Payer: Self-pay | Attending: Emergency Medicine | Admitting: Emergency Medicine

## 2017-02-19 ENCOUNTER — Encounter (HOSPITAL_COMMUNITY): Payer: Self-pay | Admitting: Emergency Medicine

## 2017-02-19 DIAGNOSIS — S0011XA Contusion of right eyelid and periocular area, initial encounter: Secondary | ICD-10-CM | POA: Insufficient documentation

## 2017-02-19 DIAGNOSIS — S82891A Other fracture of right lower leg, initial encounter for closed fracture: Secondary | ICD-10-CM

## 2017-02-19 DIAGNOSIS — S8251XA Displaced fracture of medial malleolus of right tibia, initial encounter for closed fracture: Secondary | ICD-10-CM | POA: Insufficient documentation

## 2017-02-19 DIAGNOSIS — Y998 Other external cause status: Secondary | ICD-10-CM | POA: Insufficient documentation

## 2017-02-19 DIAGNOSIS — Y929 Unspecified place or not applicable: Secondary | ICD-10-CM | POA: Insufficient documentation

## 2017-02-19 DIAGNOSIS — Z79899 Other long term (current) drug therapy: Secondary | ICD-10-CM | POA: Insufficient documentation

## 2017-02-19 DIAGNOSIS — Y9389 Activity, other specified: Secondary | ICD-10-CM | POA: Insufficient documentation

## 2017-02-19 DIAGNOSIS — W101XXA Fall (on)(from) sidewalk curb, initial encounter: Secondary | ICD-10-CM | POA: Insufficient documentation

## 2017-02-19 DIAGNOSIS — M25571 Pain in right ankle and joints of right foot: Secondary | ICD-10-CM

## 2017-02-19 DIAGNOSIS — F17210 Nicotine dependence, cigarettes, uncomplicated: Secondary | ICD-10-CM | POA: Insufficient documentation

## 2017-02-19 MED ORDER — IBUPROFEN 200 MG PO TABS
400.0000 mg | ORAL_TABLET | Freq: Once | ORAL | Status: AC | PRN
  Administered 2017-02-19: 400 mg via ORAL
  Filled 2017-02-19: qty 2

## 2017-02-19 NOTE — ED Triage Notes (Signed)
Patient reports falling off curb earlier today and injuring right ankle. Pt reports bruising and swelling to ankle. Pt has ace bandage to ankle. Ice applied in triage. Patient ambulatory in lobby.

## 2017-02-19 NOTE — ED Notes (Signed)
Bed: [facility] Expected date:  Expected time:  Means of arrival:  Comments: 

## 2017-02-20 MED ORDER — ACETAMINOPHEN 325 MG PO TABS
650.0000 mg | ORAL_TABLET | Freq: Once | ORAL | Status: AC
  Administered 2017-02-20: 650 mg via ORAL
  Filled 2017-02-20: qty 2

## 2017-02-20 MED ORDER — MELOXICAM 7.5 MG PO TABS: 7.5000 mg | ORAL_TABLET | Freq: Every day | ORAL | 0 refills | Status: DC

## 2017-02-20 NOTE — ED Provider Notes (Signed)
[facility]-EMERGENCY DEPT Provider Note   CSN: [id number] Arrival date & time: 02/19/17  1847     History   Chief Complaint Chief Complaint  Patient presents with  . Ankle Injury    HPI Alejandra Park is a 31 y.o. female with a history of opioid use disorder, hepatitis C presents today for evaluation of right ankle pain.  She reports that she was moving trash cans when she slipped off a curb injuring her right ankle.  Reports that she attempted to apply an Ace wrap prior to arrival with minimal relief.  She reports bruising and swelling to her ankle.  She has been ambulatory, however notes significant pain. She has a controlled medications contract. She reports this is an isolated injury.   HPI  Past Medical History:  Diagnosis Date  . Acute renal failure (ARF) (HCC) 01/24/2016   Due to rhabdomyolysis, required HD for 10 days then fully recovered  . History of abnormal cervical Pap smear 03/20/2016  . Moderate opioid use disorder (HCC)   . Rhabdomyolysis 01/24/2016   Traumatic rhabdo following opioid overdose    Patient Active Problem List   Diagnosis Date Noted  . URI (upper respiratory infection) 01/20/2017  . Adjustment disorder 11/26/2016  . Health care maintenance 03/20/2016  . Hepatitis C 01/29/2016  . Opioid use disorder, moderate (HCC) 06/05/2011    Past Surgical History:  Procedure Laterality Date  . INSERTION OF DIALYSIS CATHETER N/A 02/04/2016   Procedure: INSERTION OF RIGHT INTERNAL JUGULAR DIALYSIS CATHETER;  Surgeon: Maeola HarmanBrandon Christopher Cain, MD;  [facility];  Service: Vascular;  Laterality: N/A;  . IR GENERIC HISTORICAL  01/25/2016   IR US GUIDE VASC ACCESS RIGHT 01/25/2016 Berdine DanceMichael Shick, MD [facility]-INTERV RAD  . IR GENERIC HISTORICAL  01/25/2016   IR FLUORO GUIDE CV LINE RIGHT 01/25/2016 Berdine DanceMichael Shick, MD [facility]-INTER[facility]  . KNEE SURGERY    . TUBAL LIGATION      OB History    No data available       Home Medications     Prior to Admission medications   Medication Sig Start Date End Date Taking? Authorizing Provider  Buprenorphine HCl-Naloxone HCl (SUBOXONE) 8-2 MG FILM Take 2 films in the morning and 1/2 film in the evening. 01/29/17   Tyson AliasVincent, Duncan Thomas, MD  meloxicam (MOBIC) 7.5 MG tablet Take 1 tablet (7.5 mg total) by mouth daily. 02/20/17   Cristina GongHammond, Bertrice Leder W, PA-C    Family History Family History  Problem Relation Age of Onset  . Healthy Mother   . Healthy Father     Social History Social History   Tobacco Use  . Smoking status: Current Every Day Smoker    Packs/day: 0.50  . Smokeless tobacco: Current User  Substance Use Topics  . Alcohol use: Yes  . Drug use: No    Comment: Percocet and Heroin     Allergies   Patient has no known allergies.   Review of Systems Review of Systems  Constitutional: Negative for chills and fever.  Musculoskeletal: Positive for arthralgias and joint swelling.  Skin: Positive for color change (bruse).     Physical Exam Updated Vital Signs BP 114/69 (BP Location: Right Arm)   Pulse 76   Temp 98 F (36.7 C) (Oral)   Resp 16   LMP 01/30/2017   SpO2 100%   Physical Exam  Constitutional: She is oriented to person, place, and time. She appears well-developed and well-nourished.  HENT:  Head: Normocephalic.  Mild  ecchymosis around right eye.  Cardiovascular:  Right lower extremity 2+ DP/PT pulses.  Musculoskeletal:  Right lower extremity: Significant swelling, and tenderness to palpation to right ankle, worse on medial aspect.  Compartments are soft and easily compressible.  There is no localized tenderness to palpation on proximal fibula, tibia, or knee.  Patient is able to move toes.  Neurological: She is alert and oriented to person, place, and time.  Sensation intact to right lower extremity. Clinically patient appears to be under the influence of substances other than alcohol.   Skin: She is not diaphoretic.  Nursing note and  vitals reviewed.    ED Treatments / Results  Labs (all labs ordered are listed, but only abnormal results are displayed) Labs Reviewed - No data to display  EKG  EKG Interpretation None       Radiology Dg Ankle Complete Right  Result Date: 02/19/2017 CLINICAL DATA:  Larey SeatFell off curb and injured the right ankle EXAM: RIGHT ANKLE - COMPLETE 3+ VIEW COMPARISON:  None. FINDINGS: Soft tissue swelling is present. Acute displaced medial malleolar fracture. Mortise symmetric. IMPRESSION: Acute displaced medial malleolar fracture Electronically Signed   By: Jasmine PangKim  Fujinaga M.D.   On: 02/19/2017 23:27    Procedures Procedures (including critical care time)  Medications Ordered in ED Medications  ibuprofen (ADVIL,MOTRIN) tablet 400 mg (400 mg Oral Given 02/19/17 2233)  acetaminophen (TYLENOL) tablet 650 mg (650 mg Oral Given 02/20/17 0029)     Initial Impression / Assessment and Plan / ED Course  I have reviewed the triage vital signs and the nursing notes.  Pertinent labs & imaging results that were available during my care of the patient were reviewed by me and considered in my medical decision making (see chart for details).    Patient presents today for evaluation of sudden onset right ankle pain after slipping off a curb this morning.  X-rays consistent with medial malleolus fracture.  Extremity is neurovascularly intact.  Given Cam walker, crutches, and instructed to remain nonweightbearing.  As patient has a controlled substances contract, and appears to be under the influence of substances other than alcohol I do not feel like narcotic pain medicine prescription for at home uses in her best interest.  Instead she will be given a prescription for Mobic, along with instructions on supportive care.  She was given return precautions, states her understanding.  Orth O follow-up.  Discharged to home.  Final Clinical Impressions(s) / ED Diagnoses   Final diagnoses:  Acute right ankle  pain  Closed fracture of right ankle, initial encounter    ED Discharge Orders        Ordered    meloxicam (MOBIC) 7.5 MG tablet  Daily     02/20/17 0041       Cristina GongHammond, Khayree Delellis W, PA-C 02/20/17 0245    Palumbo, April, MD 02/20/17 [phone]

## 2017-02-20 NOTE — Discharge Instructions (Signed)
Please do not put any weight on the broken ankle until you see the bone doctor.  Please keep the leg elevated above your heart when ever possible.  Please take the mobic with food and watch your bowel movements.  If you have dark, tarry, sticky bowel movements then stop the mobic and follow up with your doctor.

## 2017-02-25 ENCOUNTER — Ambulatory Visit (INDEPENDENT_AMBULATORY_CARE_PROVIDER_SITE_OTHER): Payer: Self-pay | Admitting: Family

## 2017-02-25 ENCOUNTER — Encounter (INDEPENDENT_AMBULATORY_CARE_PROVIDER_SITE_OTHER): Payer: Self-pay | Admitting: Family

## 2017-02-25 DIAGNOSIS — S8251XA Displaced fracture of medial malleolus of right tibia, initial encounter for closed fracture: Secondary | ICD-10-CM

## 2017-02-25 MED ORDER — OXYCODONE-ACETAMINOPHEN 5-325 MG PO TABS: 1.0000 | ORAL_TABLET | Freq: Four times a day (QID) | ORAL | 0 refills | Status: DC | PRN

## 2017-02-25 MED ORDER — HYDROCODONE-ACETAMINOPHEN 5-325 MG PO TABS: 1.0000 | ORAL_TABLET | Freq: Four times a day (QID) | ORAL | 0 refills | Status: DC | PRN

## 2017-02-25 NOTE — Progress Notes (Signed)
Office Visit Note   Patient: Alejandra Park           Date of Birth: 05/15/1985           MRN: [id number] Visit Date: 02/25/2017              Requested by: Alejandra PollGuilloud, Carolyn, MD [address] [place], [place] [address] PCP: Alejandra PollGuilloud, Carolyn, MD  Chief Complaint  Patient presents with  . Right Ankle - Injury, Pain      HPI: The patient is a 32 year old woman seen today for evaluation of right ankle pain. Was evaluated in ED on 02/19/17. Did have a fall with subsequent displaced medial malleolar fracture on right. Today in is in CAM walker with crutches. Is nonweight bearing.   Is prescribed suboxone. States has not been taking this for 2 weeks or more. States has weaned herself off her suboxone. States is not in pain management. Is followed by [facility]. States has appointment with them next in 2 more weeks.  Requesting percocet prescription. Was given Mobic for pain in ED.  Assessment & Plan: Visit Diagnoses:  1. Closed displaced fracture of medial malleolus of right tibia, initial encounter     Plan: given a one time Rx for Vicodin. Advised to follow up with her PCP for pain management. Will follow up in office in 2 weeks with repeat radiographs. Strict nonweightbearing. continue CAM walker. Elevate and use ice for swelling and pain.   Follow-Up Instructions: No Follow-up on file.   Right Ankle Exam   Tenderness  The patient is experiencing tenderness in the medial malleolus.  Range of Motion  The patient has normal right ankle ROM.  Other  Erythema: present Sensation: normal Pulse: present   Comments:  Ecchymosis and swelling to medial ankle      Patient is alert, oriented, no adenopathy, well-dressed, normal affect, normal respiratory effort.   Imaging: No results found. No images are attached to the encounter.  Labs: Lab Results  Component Value Date   HGBA1C 5.4 01/23/2016   LABURIC 8.6 (H) 01/24/2016   REPTSTATUS 02/06/2016  FINAL 02/01/2016   GRAMSTAIN  01/31/2016    RARE WBC PRESENT, PREDOMINANTLY MONONUCLEAR NO ORGANISMS SEEN    CULT NO GROWTH 5 DAYS 02/01/2016    @LABSALLVALUES (HGBA1)@  There is no height or weight on file to calculate BMI.  Orders:  No orders of the defined types were placed in this encounter.  Meds ordered this encounter  Medications  . DISCONTD: oxyCODONE-acetaminophen (PERCOCET/ROXICET) 5-325 MG tablet    Sig: Take 1 tablet by mouth every 6 (six) hours as needed for severe pain.    Dispense:  6 tablet    Refill:  0  . HYDROcodone-acetaminophen (NORCO) 5-325 MG tablet    Sig: Take 1 tablet by mouth every 6 (six) hours as needed for moderate pain.    Dispense:  30 tablet    Refill:  0     Procedures: No procedures performed  Clinical Data: No additional findings.  ROS:  All other systems negative, except as noted in the HPI. Review of Systems  Constitutional: Negative for chills and fever.  Musculoskeletal: Positive for arthralgias, joint swelling and myalgias.  Skin: Positive for color change.    Objective: Vital Signs: LMP 01/30/2017   Specialty Comments:  No specialty comments available.  PMFS History: Patient Active Problem List   Diagnosis Date Noted  . URI (upper respiratory infection) 01/20/2017  . Adjustment disorder 11/26/2016  .  Health care maintenance 03/20/2016  . Hepatitis C 01/29/2016  . Opioid use disorder, moderate (HCC) 06/05/2011   Past Medical History:  Diagnosis Date  . Acute renal failure (ARF) (HCC) 01/24/2016   Due to rhabdomyolysis, required HD for 10 days then fully recovered  . History of abnormal cervical Pap smear 03/20/2016  . Moderate opioid use disorder (HCC)   . Rhabdomyolysis 01/24/2016   Traumatic rhabdo following opioid overdose    Family History  Problem Relation Age of Onset  . Healthy Mother   . Healthy Father     Past Surgical History:  Procedure Laterality Date  . INSERTION OF DIALYSIS CATHETER N/A  02/04/2016   Procedure: INSERTION OF RIGHT INTERNAL JUGULAR DIALYSIS CATHETER;  Surgeon: Alejandra Harman, MD;  Location: [facility] OR;  Service: Vascular;  Laterality: N/A;  . IR GENERIC HISTORICAL  01/25/2016   IR US GUIDE VASC ACCESS RIGHT 01/25/2016 Alejandra Dance, MD [facility]  . IR GENERIC HISTORICAL  01/25/2016   IR FLUORO GUIDE CV LINE RIGHT 01/25/2016 Alejandra Dance, MD [facility]  . KNEE SURGERY    . TUBAL LIGATION     Social History   Occupational History  . Not on file  Tobacco Use  . Smoking status: Current Every Day Smoker    Packs/day: 0.50  . Smokeless tobacco: Current User  Substance and Sexual Activity  . Alcohol use: Yes  . Drug use: No    Comment: Percocet and Heroin  . Sexual activity: Yes    Partners: Male    Birth control/protection: Surgical

## 2017-03-11 ENCOUNTER — Ambulatory Visit (INDEPENDENT_AMBULATORY_CARE_PROVIDER_SITE_OTHER): Payer: Self-pay | Admitting: Orthopedic Surgery

## 2017-03-11 ENCOUNTER — Encounter (INDEPENDENT_AMBULATORY_CARE_PROVIDER_SITE_OTHER): Payer: Self-pay | Admitting: Family

## 2017-03-11 ENCOUNTER — Ambulatory Visit (INDEPENDENT_AMBULATORY_CARE_PROVIDER_SITE_OTHER): Payer: MEDICAID

## 2017-03-11 DIAGNOSIS — M25571 Pain in right ankle and joints of right foot: Secondary | ICD-10-CM

## 2017-03-11 DIAGNOSIS — S8251XA Displaced fracture of medial malleolus of right tibia, initial encounter for closed fracture: Secondary | ICD-10-CM

## 2017-03-11 NOTE — Progress Notes (Signed)
Office Visit Note   Patient: Alejandra Park           Date of Birth: [birth year]           MRN: [id number] Visit Date: 03/11/2017              Requested by: Reymundo Poll, MD [address] [place], [place] [address] PCP: Reymundo Poll, MD  Chief Complaint  Patient presents with  . Right Ankle - Fracture      HPI: Patient is 3 weeks status post a medial malleolar fracture right ankle.  Discussed surgical options.  Patient states that she does not want screws placed in her ankle.  Assessment & Plan: Visit Diagnoses:  1. Pain in right ankle and joints of right foot   2. Closed displaced fracture of medial malleolus of right tibia, initial encounter     Plan: Patient's medial malleolus is tender to palpation I am concerned she is developing a fibrous union.  Discussed the possibility of proceeding with internal fixation.  Patient states she would like to continue with closed conservative therapy for 3 additional weeks as she is still symptomatic in 3 weeks we will proceed with surgical fixation.  Follow-Up Instructions: Return in about 3 weeks (around 04/01/2017).   Ortho Exam  Patient is alert, oriented, no adenopathy, well-dressed, normal affect, normal respiratory effort. Patient has a good dorsalis pedis pulse.  She has no pain with range of motion of the ankle.  The fracture site is tender to palpation.  There are no dystrophic changes no redness no cellulitis no swelling no signs of infection no signs of dystrophy.  Imaging: Xr Ankle Complete Right  Result Date: 03/11/2017 3 view radiographs of the right foot shows a congruent mortise in the AP view.  In the AP view there is no displacement of the medial malleolus in the lateral and oblique view there is displacement.  No images are attached to the encounter.  Labs: Lab Results  Component Value Date   HGBA1C 5.4 01/23/2016   LABURIC 8.6 (H) 01/24/2016   REPTSTATUS 02/06/2016 FINAL 02/01/2016   GRAMSTAIN   01/31/2016    RARE WBC PRESENT, PREDOMINANTLY MONONUCLEAR NO ORGANISMS SEEN    CULT NO GROWTH 5 DAYS 02/01/2016    @LABSALLVALUES (HGBA1)@  There is no height or weight on file to calculate BMI.  Orders:  Orders Placed This Encounter  Procedures  . XR Ankle Complete Right   No orders of the defined types were placed in this encounter.    Procedures: No procedures performed  Clinical Data: No additional findings.  ROS:  All other systems negative, except as noted in the HPI. Review of Systems  Objective: Vital Signs: There were no vitals taken for this visit.  Specialty Comments:  No specialty comments available.  PMFS History: Patient Active Problem List   Diagnosis Date Noted  . URI (upper respiratory infection) 01/20/2017  . Adjustment disorder 11/26/2016  . Health care maintenance 03/20/2016  . Hepatitis C 01/29/2016  . Opioid use disorder, moderate (HCC) 06/05/2011   Past Medical History:  Diagnosis Date  . Acute renal failure (ARF) (HCC) 01/24/2016   Due to rhabdomyolysis, required HD for 10 days then fully recovered  . History of abnormal cervical Pap smear 03/20/2016  . Moderate opioid use disorder (HCC)   . Rhabdomyolysis 01/24/2016   Traumatic rhabdo following opioid overdose    Family History  Problem Relation Age of Onset  . Healthy Mother   . Healthy Father  Past Surgical History:  Procedure Laterality Date  . INSERTION OF DIALYSIS CATHETER N/A 02/04/2016   Procedure: INSERTION OF RIGHT INTERNAL JUGULAR DIALYSIS CATHETER;  Surgeon: Maeola HarmanBrandon Christopher Cain, MD;  [facility] OR;  Service: Vascular;  Laterality: N/A;  . IR GENERIC HISTORICAL  01/25/2016   IR US GUIDE VASC ACCESS RIGHT 01/25/2016 Berdine DanceMichael Shick, MD [facility]  . IR GENERIC HISTORICAL  01/25/2016   IR FLUORO GUIDE CV LINE RIGHT 01/25/2016 Berdine DanceMichael Shick, MD [facility]  . KNEE SURGERY    . TUBAL LIGATION     Social History   Occupational History  . Not on file    Tobacco Use  . Smoking status: Current Every Day Smoker    Packs/day: 0.50  . Smokeless tobacco: Current User  Substance and Sexual Activity  . Alcohol use: Yes  . Drug use: No    Comment: Percocet and Heroin  . Sexual activity: Yes    Partners: Male    Birth control/protection: Surgical

## 2017-04-01 ENCOUNTER — Ambulatory Visit (INDEPENDENT_AMBULATORY_CARE_PROVIDER_SITE_OTHER): Payer: MEDICAID | Admitting: Orthopedic Surgery

## 2017-04-13 ENCOUNTER — Ambulatory Visit (INDEPENDENT_AMBULATORY_CARE_PROVIDER_SITE_OTHER): Payer: Self-pay | Admitting: Orthopedic Surgery

## 2017-04-22 ENCOUNTER — Encounter (INDEPENDENT_AMBULATORY_CARE_PROVIDER_SITE_OTHER): Payer: Self-pay | Admitting: Orthopedic Surgery

## 2017-04-22 ENCOUNTER — Ambulatory Visit (INDEPENDENT_AMBULATORY_CARE_PROVIDER_SITE_OTHER): Payer: Self-pay

## 2017-04-22 ENCOUNTER — Ambulatory Visit (INDEPENDENT_AMBULATORY_CARE_PROVIDER_SITE_OTHER): Payer: Self-pay | Admitting: Orthopedic Surgery

## 2017-04-22 VITALS — Ht 67.0 in | Wt 140.0 lb

## 2017-04-22 DIAGNOSIS — S8252XD Displaced fracture of medial malleolus of left tibia, subsequent encounter for closed fracture with routine healing: Secondary | ICD-10-CM

## 2017-04-22 NOTE — Progress Notes (Signed)
Office Visit Note   Patient: Rosalva FerronRebecca D Oguinn           Date of Birth: Jul 03, 1985           MRN: [id number] Visit Date: 04/22/2017              Requested by: Reymundo PollGuilloud, Carolyn, MD [address] [place], [place] [address] PCP: Reymundo PollGuilloud, Carolyn, MD  Chief Complaint  Patient presents with  . Right Ankle - Pain      HPI: Patient presents in follow-up status post displaced medial malleolar fracture.  Patient again repeats that she does not want surgical intervention.  She states she has had some swelling and some varicose veins over this area.  Patient is not wearing her fracture boot she is currently wearing her high heel boots.  She denies any pain with weightbearing.  Assessment & Plan: Visit Diagnoses:  1. Displaced fracture of medial malleolus of left tibia, subsequent encounter for closed fracture with routine healing     Plan: Recommend patient can continue with her current care she has no pain with range of motion the ankle she has no pain with regular shoewear.  We will follow-up in 4 weeks to repeat a radiograph.  Follow-Up Instructions: No Follow-up on file.   Ortho Exam  Patient is alert, oriented, no adenopathy, well-dressed, normal affect, normal respiratory effort. On examination patient has no tenderness to palpation of the medial malleolus there is no pain with range of motion of the ankle anterior drawer is stable there is no instability of the tibial talar joint there is no bruising no ecchymosis or swelling medially she does have some mild varicose veins.  Imaging: Xr Ankle Complete Right  Result Date: 04/22/2017 3 view radiographs of the right ankle shows stable alignment of the distal medial malleolar fracture.  There is no change in alignment there is no increased callus formation.  Her joint space is congruent.  No images are attached to the encounter.  Labs: Lab Results  Component Value Date   HGBA1C 5.4 01/23/2016   LABURIC 8.6 (H) 01/24/2016   REPTSTATUS 02/06/2016 FINAL 02/01/2016   GRAMSTAIN  01/31/2016    RARE WBC PRESENT, PREDOMINANTLY MONONUCLEAR NO ORGANISMS SEEN    CULT NO GROWTH 5 DAYS 02/01/2016    @LABSALLVALUES (HGBA1)@  Body mass index is 21.93 kg/m.  Orders:  Orders Placed This Encounter  Procedures  . XR Ankle Complete Right   No orders of the defined types were placed in this encounter.    Procedures: No procedures performed  Clinical Data: No additional findings.  ROS:  All other systems negative, except as noted in the HPI. Review of Systems  Objective: Vital Signs: Ht 5\' 7"  (1.702 m)   Wt 140 lb (63.5 kg)   BMI 21.93 kg/m   Specialty Comments:  No specialty comments available.  PMFS History: Patient Active Problem List   Diagnosis Date Noted  . URI (upper respiratory infection) 01/20/2017  . Adjustment disorder 11/26/2016  . Health care maintenance 03/20/2016  . Hepatitis C 01/29/2016  . Opioid use disorder, moderate (HCC) 06/05/2011   Past Medical History:  Diagnosis Date  . Acute renal failure (ARF) (HCC) 01/24/2016   Due to rhabdomyolysis, required HD for 10 days then fully recovered  . History of abnormal cervical Pap smear 03/20/2016  . Moderate opioid use disorder (HCC)   . Rhabdomyolysis 01/24/2016   Traumatic rhabdo following opioid overdose    Family History  Problem Relation Age of Onset  .  Healthy Mother   . Healthy Father     Past Surgical History:  Procedure Laterality Date  . INSERTION OF DIALYSIS CATHETER N/A 02/04/2016   Procedure: INSERTION OF RIGHT INTERNAL JUGULAR DIALYSIS CATHETER;  Surgeon: Maeola Harman, MD;  Location: [facility] OR;  Service: Vascular;  Laterality: N/A;  . IR GENERIC HISTORICAL  01/25/2016   IR US GUIDE VASC ACCESS RIGHT 01/25/2016 Berdine Dance, MD [facility]  . IR GENERIC HISTORICAL  01/25/2016   IR FLUORO GUIDE CV LINE RIGHT 01/25/2016 Berdine Dance, MD [facility]  . KNEE SURGERY    . TUBAL LIGATION     Social History     Occupational History  . Not on file  Tobacco Use  . Smoking status: Current Every Day Smoker    Packs/day: 0.50  . Smokeless tobacco: Current User  Substance and Sexual Activity  . Alcohol use: Yes  . Drug use: No    Comment: Percocet and Heroin  . Sexual activity: Yes    Partners: Male    Birth control/protection: Surgical

## 2017-05-20 ENCOUNTER — Ambulatory Visit (INDEPENDENT_AMBULATORY_CARE_PROVIDER_SITE_OTHER): Payer: Self-pay | Admitting: Orthopedic Surgery

## 2017-07-24 IMAGING — CT CT ABD-PELV W/O CM
2 of 4 series · 16 of 46 positions shown, 18 images · non-contrast
Comparison: CT dated 02/02/2016

CLINICAL DATA: 30-year-old female with anemia. Intraperitoneal
hemorrhage noted on the prior CT of 02/02/2016

EXAM:
CT ABDOMEN AND PELVIS WITHOUT CONTRAST
TECHNIQUE: Multidetector CT imaging of the abdomen and pelvis was performed
following the standard protocol without IV contrast.

[Series 2: a/p w/o 5mm · axial · non-contrast · 0.86mm/px · z∈[-477,-57]mm · 13 of 95 slices shown, 15 images]
[im 7/95  soft-tissue]
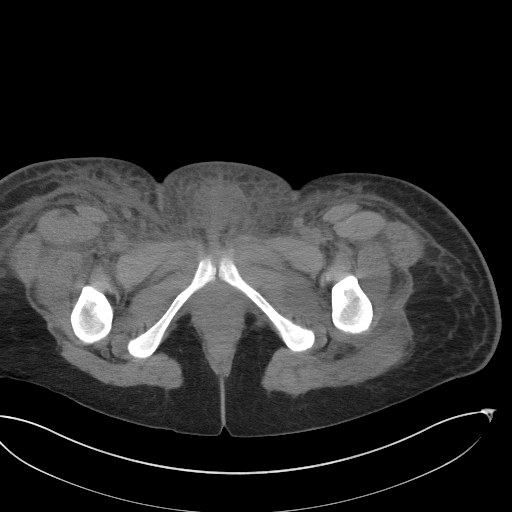
[im 7/95  bone]
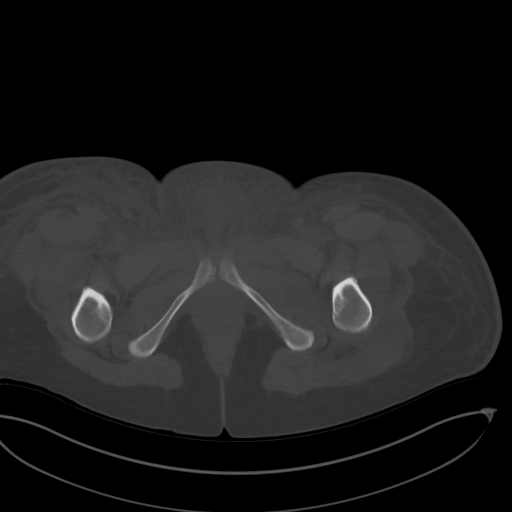
[im 14/95  soft-tissue]
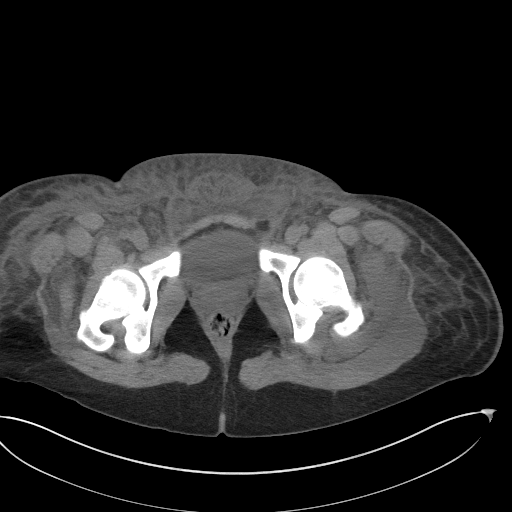
[im 21/95  soft-tissue]
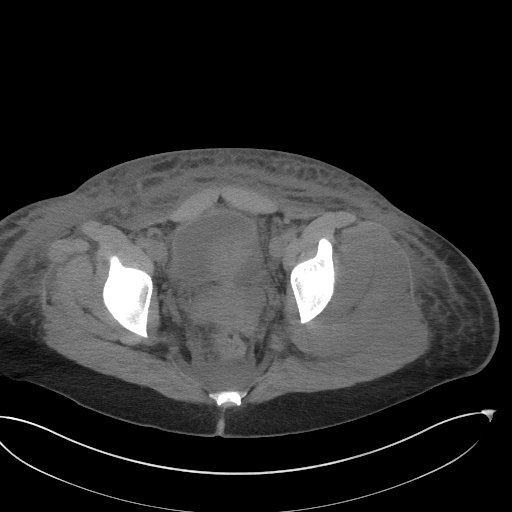
[im 28/95  soft-tissue]
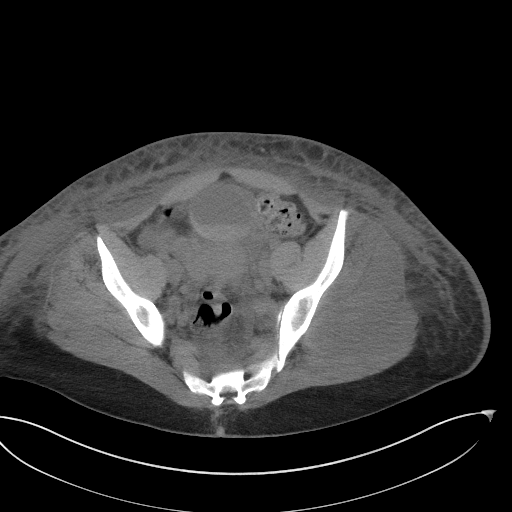
[im 35/95  soft-tissue]
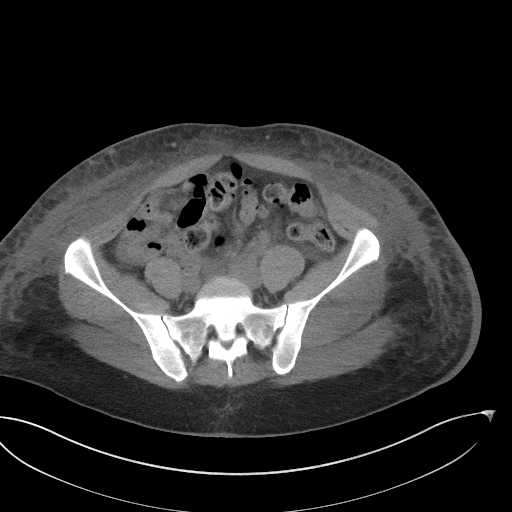
[im 42/95  soft-tissue]
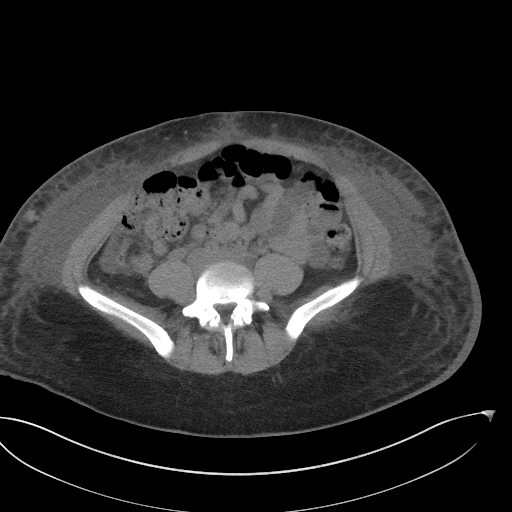
[im 49/95  soft-tissue]
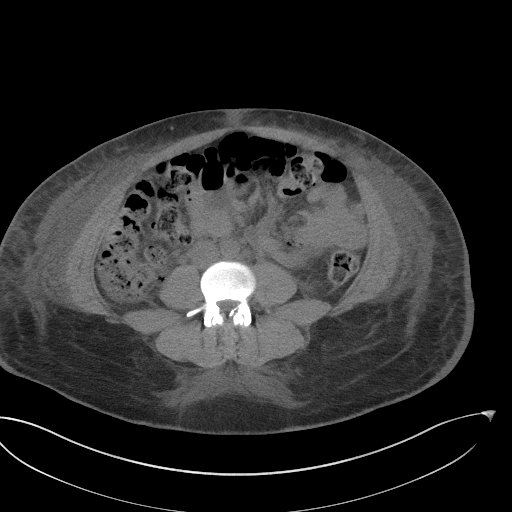
[im 56/95  soft-tissue]
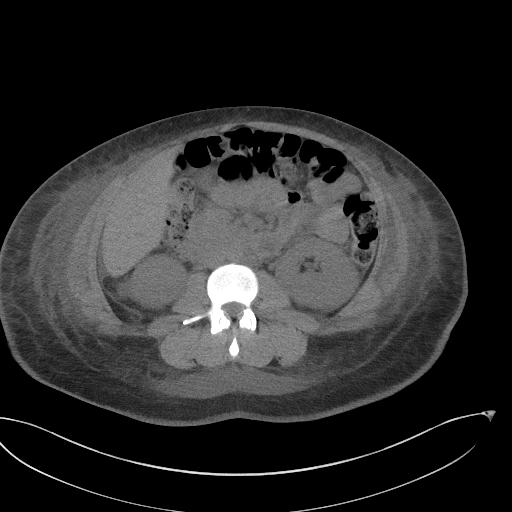
[im 63/95  soft-tissue]
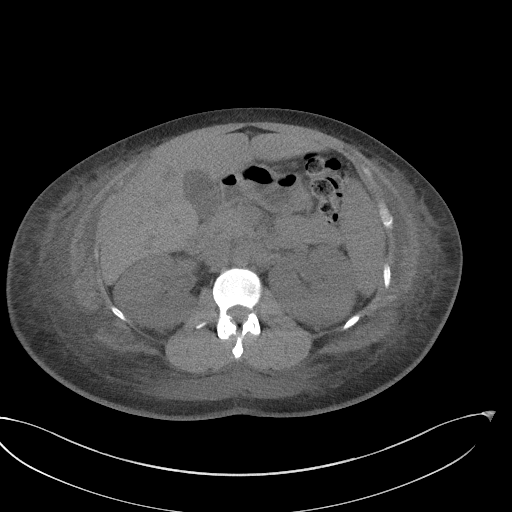
[im 63/95  bone]
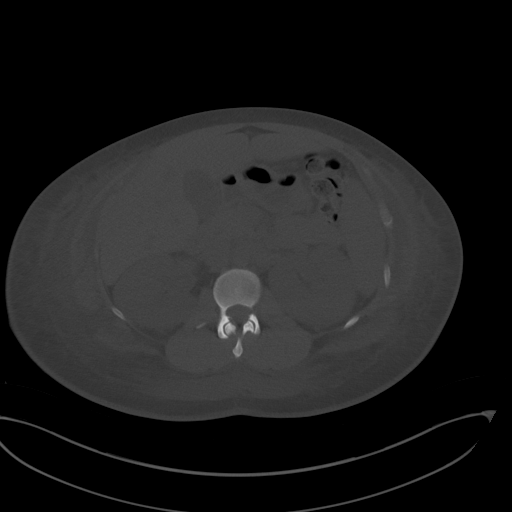
[im 70/95  soft-tissue]
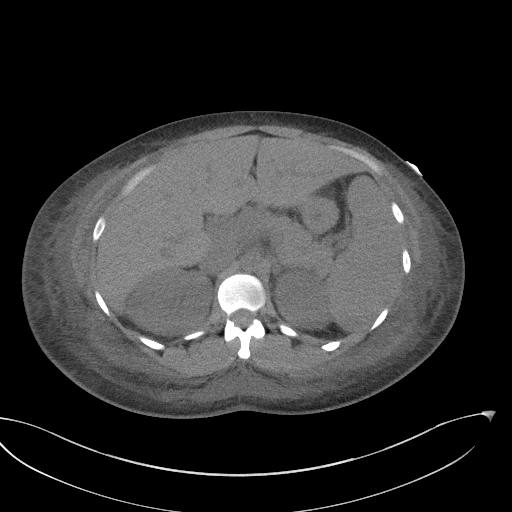
[im 77/95  soft-tissue]
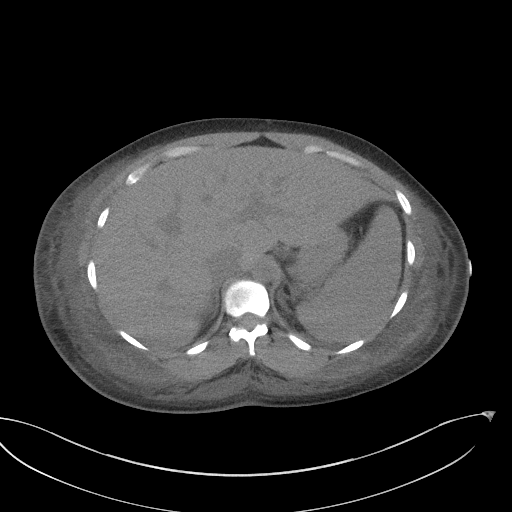
[im 84/95  soft-tissue]
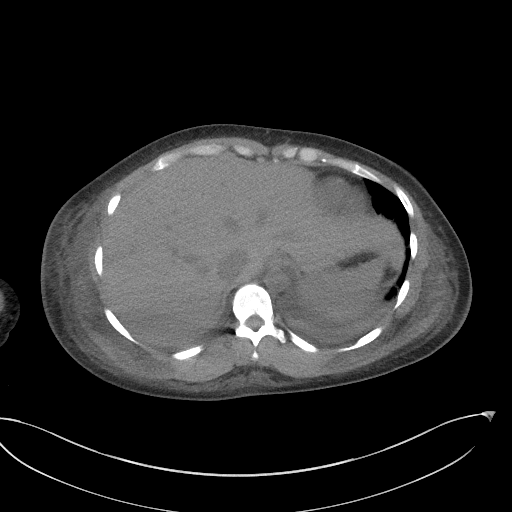
[im 91/95  soft-tissue]
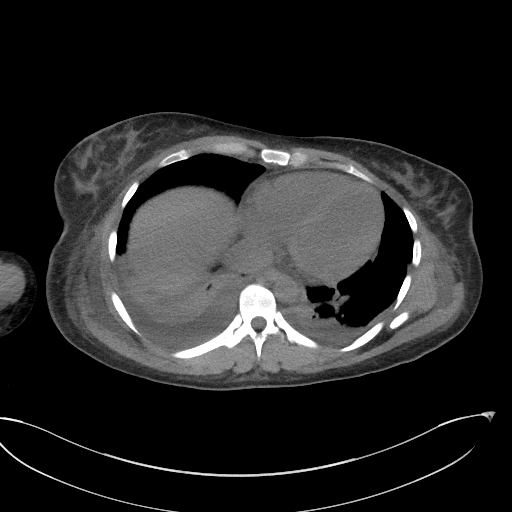

[Series 5: a/p w/o cor · coronal · non-contrast · 0.91mm/px · 3 of 155 slices shown]
[im 52/155  soft-tissue]
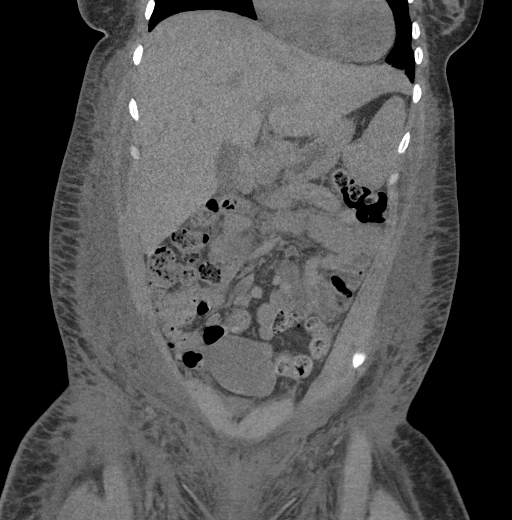
[im 69/155  soft-tissue]
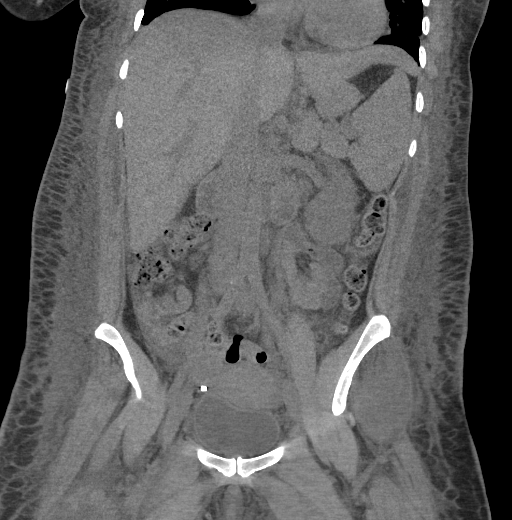
[im 86/155  soft-tissue]
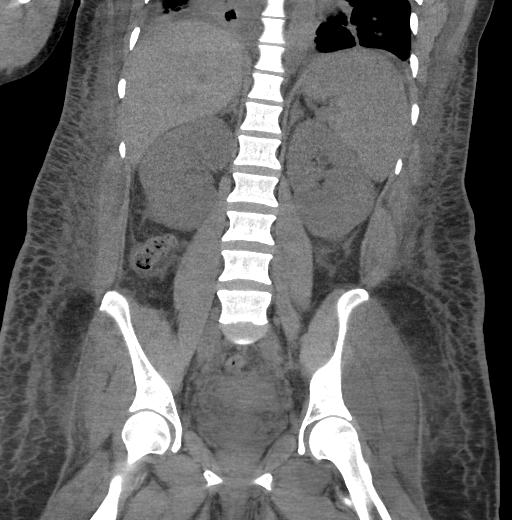

[16 of 46 positions shown; findings below may reference images not displayed]

FINDINGS: Evaluation of this exam is limited in the absence of intravenous
contrast.

Lower chest: Partially visualized small bilateral pleural effusions
with partial consolidative changes of the lung bases which may
represent atelectasis versus infiltrate. There has been interval
increase in the size of the pleural effusion and consolidative
changes of the lungs since the prior CT.

There is hypoattenuation of the cardiac blood pool suggestive of a
degree of anemia.

No intra-abdominal free air. There is diffuse mesenteric stranding
and small fluid within the pelvis.

Hepatobiliary: No focal liver abnormality is seen. No gallstones,
gallbladder wall thickening, or biliary dilatation.

Pancreas: Unremarkable as visualized.

Spleen: Normal in size without focal abnormality.

Adrenals/Urinary Tract: The adrenal glands appear unremarkable.
Small nonobstructing left renal inferior pole calculi measuring up
to 3 mm. There is no hydronephrosis. The right kidney is
unremarkable. The visualized ureters and urinary bladder appear
unremarkable.

Stomach/Bowel: There is moderate stool throughout the colon. No
evidence of bowel obstruction or active inflammation. Normal
appendix.

Vascular/Lymphatic: The abdominal aorta and IVC are grossly
unremarkable on this noncontrast study. No portal venous gas
identified. There is no adenopathy.

Reproductive: The uterus is anteverted and grossly unremarkable.
Bilateral tubal ligation clips noted.

Other: There is a 5.2 x 5.5 cm rounded structure with complex and
layering fluid fluid collection within the pelvis anterior to the
uterine fundus most compatible with hematoma. High attenuating
layering density within this collection represents hematocrit level.
There is no significant interval change in the size of the hematoma
compared to the prior CT. There is diffuse mesenteric stranding and
subcutaneous soft tissue edema and anasarca.

Musculoskeletal: There is asymmetric enlargement of the left gluteal
musculature compared to the right likely related to recent trauma.
No drainable collection identified. There are bilateral L4 and L5
pars defects. No acute fracture. No listhesis.
IMPRESSION: No significant interval change in the size of the intraperitoneal
hematoma in the anterior pelvis.

Increase in the size of bilateral pleural effusions as well as
bilateral lower lobe consolidative changes compared to the prior CT.

Small ascites, diffuse mesenteric edema, and anasarca.

Asymmetric enlargement of the left gluteal musculature similar to
prior CT and likely related to recent trauma.

Small nonobstructing left renal calculi.

## 2018-04-13 ENCOUNTER — Encounter: Payer: Self-pay | Admitting: Internal Medicine

## 2018-04-13 ENCOUNTER — Telehealth: Payer: Self-pay | Admitting: Internal Medicine

## 2018-04-13 NOTE — Telephone Encounter (Signed)
Unable to contact patient to schedule an appt.  Message keeps coming on stating call cannot be completed at this time please try back later.  Sending patient a letter to address on file to please call the clinic to schedule an appt.

## 2018-09-08 ENCOUNTER — Other Ambulatory Visit: Payer: Self-pay | Admitting: Internal Medicine

## 2018-09-08 DIAGNOSIS — Z20822 Contact with and (suspected) exposure to covid-19: Secondary | ICD-10-CM

## 2018-09-14 LAB — NOVEL CORONAVIRUS, NAA: SARS-CoV-2, NAA: NOT DETECTED

## 2018-09-19 ENCOUNTER — Telehealth: Payer: Self-pay

## 2018-09-19 NOTE — Telephone Encounter (Signed)
Pt called and stated that she was tested for Covid 19 09/08/18 at the [facility] location. Pt was calling for test results. No results or pending results are noted in the lab section of the chart Notified team leader Arbie Cookey who stated to have pt call [facility]. Pt call transferred to [place]. Pt advised to call back for any further issues.

## 2019-04-21 ENCOUNTER — Other Ambulatory Visit: Payer: Self-pay

## 2019-04-21 ENCOUNTER — Emergency Department (HOSPITAL_COMMUNITY)
Admission: EM | Admit: 2019-04-21 | Discharge: 2019-04-22 | Disposition: A | Discharge status: Home / Self Care | Payer: Self-pay | Attending: Emergency Medicine | Admitting: Emergency Medicine

## 2019-04-21 DIAGNOSIS — E876 Hypokalemia: Secondary | ICD-10-CM | POA: Insufficient documentation

## 2019-04-21 DIAGNOSIS — Z79899 Other long term (current) drug therapy: Secondary | ICD-10-CM | POA: Insufficient documentation

## 2019-04-21 DIAGNOSIS — F17210 Nicotine dependence, cigarettes, uncomplicated: Secondary | ICD-10-CM | POA: Insufficient documentation

## 2019-04-21 DIAGNOSIS — Y998 Other external cause status: Secondary | ICD-10-CM | POA: Insufficient documentation

## 2019-04-21 DIAGNOSIS — Y9384 Activity, sleeping: Secondary | ICD-10-CM | POA: Insufficient documentation

## 2019-04-21 DIAGNOSIS — Y92013 Bedroom of single-family (private) house as the place of occurrence of the external cause: Secondary | ICD-10-CM | POA: Insufficient documentation

## 2019-04-21 DIAGNOSIS — G44319 Acute post-traumatic headache, not intractable: Secondary | ICD-10-CM | POA: Insufficient documentation

## 2019-04-21 DIAGNOSIS — S0990XA Unspecified injury of head, initial encounter: Secondary | ICD-10-CM | POA: Insufficient documentation

## 2019-04-21 DIAGNOSIS — W228XXA Striking against or struck by other objects, initial encounter: Secondary | ICD-10-CM | POA: Insufficient documentation

## 2019-04-21 DIAGNOSIS — F1110 Opioid abuse, uncomplicated: Secondary | ICD-10-CM | POA: Insufficient documentation

## 2019-04-22 ENCOUNTER — Emergency Department (HOSPITAL_COMMUNITY): Payer: Self-pay

## 2019-04-22 ENCOUNTER — Other Ambulatory Visit: Payer: Self-pay

## 2019-04-22 ENCOUNTER — Encounter (HOSPITAL_COMMUNITY): Payer: Self-pay | Admitting: Emergency Medicine

## 2019-04-22 LAB — BASIC METABOLIC PANEL
Anion gap: 14 (ref 5–15)
BUN: 11 mg/dL (ref 6–20)
CO2: 20 mmol/L — ABNORMAL LOW (ref 22–32)
Calcium: 9.3 mg/dL (ref 8.9–10.3)
Chloride: 103 mmol/L (ref 98–111)
Creatinine, Ser: 0.82 mg/dL (ref 0.44–1.00)
GFR calc Af Amer: >60 mL/min (ref >60)
GFR calc non Af Amer: >60 mL/min (ref >60)
Glucose, Bld: 130 mg/dL — ABNORMAL HIGH (ref 70–99)
Potassium: 3.1 mmol/L — ABNORMAL LOW (ref 3.5–5.1)
Sodium: 137 mmol/L (ref 135–145)

## 2019-04-22 LAB — CBC WITH DIFFERENTIAL/PLATELET
Abs Immature Granulocytes: 0.02 10*3/uL (ref 0.00–0.07)
Basophils Absolute: 0 10*3/uL (ref 0.0–0.1)
Basophils Relative: 0 %
Eosinophils Absolute: 0.1 10*3/uL (ref 0.0–0.5)
Eosinophils Relative: 2 %
HCT: 42.4 % (ref 36.0–46.0)
Hemoglobin: 14.2 g/dL (ref 12.0–15.0)
Immature Granulocytes: 0 %
Lymphocytes Relative: 35 %
Lymphs Abs: 3.4 10*3/uL (ref 0.7–4.0)
MCH: 30.4 pg (ref 26.0–34.0)
MCHC: 33.5 g/dL (ref 30.0–36.0)
MCV: 90.8 fL (ref 80.0–100.0)
Monocytes Absolute: 0.8 10*3/uL (ref 0.1–1.0)
Monocytes Relative: 8 %
Neutro Abs: 5.2 10*3/uL (ref 1.7–7.7)
Neutrophils Relative %: 55 %
Platelets: 327 10*3/uL (ref 150–400)
RBC: 4.67 MIL/uL (ref 3.87–5.11)
RDW: 12.8 % (ref 11.5–15.5)
WBC: 9.5 10*3/uL (ref 4.0–10.5)
nRBC: 0 % (ref 0.0–0.2)

## 2019-04-22 LAB — I-STAT BETA HCG BLOOD, ED (MC, WL, AP ONLY): I-stat hCG, quantitative: <5 m[IU]/mL (ref <5)

## 2019-04-22 MED ORDER — SODIUM CHLORIDE 0.9 % IV BOLUS
500.0000 mL | Freq: Once | INTRAVENOUS | Status: AC
  Administered 2019-04-22: 500 mL via INTRAVENOUS

## 2019-04-22 MED ORDER — POTASSIUM CHLORIDE CRYS ER 20 MEQ PO TBCR
40.0000 meq | EXTENDED_RELEASE_TABLET | Freq: Once | ORAL | Status: AC
  Administered 2019-04-22: 40 meq via ORAL
  Filled 2019-04-22: qty 2

## 2019-04-22 MED ORDER — PROCHLORPERAZINE EDISYLATE 10 MG/2ML IJ SOLN
10.0000 mg | Freq: Once | INTRAMUSCULAR | Status: AC
  Administered 2019-04-22: 10 mg via INTRAVENOUS
  Filled 2019-04-22: qty 2

## 2019-04-22 MED ORDER — DIPHENHYDRAMINE HCL 50 MG/ML IJ SOLN
12.5000 mg | Freq: Once | INTRAMUSCULAR | Status: AC
  Administered 2019-04-22: 12.5 mg via INTRAVENOUS
  Filled 2019-04-22: qty 1

## 2019-04-22 MED ORDER — KETOROLAC TROMETHAMINE 30 MG/ML IJ SOLN
30.0000 mg | Freq: Once | INTRAMUSCULAR | Status: AC
  Administered 2019-04-22: 30 mg via INTRAVENOUS
  Filled 2019-04-22: qty 1

## 2019-04-22 NOTE — ED Provider Notes (Signed)
[facility] EMERGENCY DEPARTMENT Provider Note   CSN: [id number] Arrival date & time: 04/21/19  2352     History Chief Complaint  Patient presents with  . Head Injury    Alejandra DEELEY is a 34 y.o. female with a hx of polysubstance abuse presents to the Emergency Department complaining of gradual, persistent, progressively worsening left-sided headache after striking the left side of her scalp on the windowsill around 5 AM.  Patient reports she rolled over in bed and did not realize how close she was to the wall.  She reports associated small laceration and contusion.  She reports that initially the site was sore but she did not have a significant headache.  As the day has progressed her headache has worsened significantly.  She has taken Tylenol and ibuprofen without improvement.  She reports now she has blurred vision and some dizziness.  She reports that occasionally she stumbles when she walks since she hit her head.  No vomiting.  No double vision.  Patient does not take a blood thinner.  Nothing seems to make the symptoms better.  Reports she is up-to-date on her tetanus   The history is provided by the patient and medical records. No language interpreter was used.       Past Medical History:  Diagnosis Date  . Acute renal failure (ARF) (HCC) 01/24/2016   Due to rhabdomyolysis, required HD for 10 days then fully recovered  . History of abnormal cervical Pap smear 03/20/2016  . Moderate opioid use disorder (HCC)   . Rhabdomyolysis 01/24/2016   Traumatic rhabdo following opioid overdose    Patient Active Problem List   Diagnosis Date Noted  . URI (upper respiratory infection) 01/20/2017  . Adjustment disorder 11/26/2016  . Health care maintenance 03/20/2016  . Hepatitis C 01/29/2016  . Opioid use disorder, moderate (HCC) 06/05/2011    Past Surgical History:  Procedure Laterality Date  . INSERTION OF DIALYSIS CATHETER N/A 02/04/2016   Procedure:  INSERTION OF RIGHT INTERNAL JUGULAR DIALYSIS CATHETER;  Surgeon: Maeola Harman, MD;  Location: [facility] OR;  Service: Vascular;  Laterality: N/A;  . IR GENERIC HISTORICAL  01/25/2016   IR US GUIDE VASC ACCESS RIGHT 01/25/2016 Berdine Dance, MD [facility]  . IR GENERIC HISTORICAL  01/25/2016   IR FLUORO GUIDE CV LINE RIGHT 01/25/2016 Berdine Dance, MD [facility]  . KNEE SURGERY    . TUBAL LIGATION       OB History   No obstetric history on file.     Family History  Problem Relation Age of Onset  . Healthy Mother   . Healthy Father     Social History   Tobacco Use  . Smoking status: Current Every Day Smoker    Packs/day: 0.50  . Smokeless tobacco: Current User  Substance Use Topics  . Alcohol use: Yes  . Drug use: Yes    Comment: Percocet and Heroin    Home Medications Prior to Admission medications   Medication Sig Start Date End Date Taking? Authorizing Provider  Buprenorphine HCl-Naloxone HCl (SUBOXONE) 8-2 MG FILM Take 2 films in the morning and 1/2 film in the evening. 01/29/17   Tyson Alias, MD  HYDROcodone-acetaminophen (NORCO) 5-325 MG tablet Take 1 tablet by mouth every 6 (six) hours as needed for moderate pain. 02/25/17   Adonis Huguenin, NP  meloxicam (MOBIC) 7.5 MG tablet Take 1 tablet (7.5 mg total) by mouth daily. 02/20/17   Cristina Gong, PA-C  Allergies    Patient has no known allergies.  Review of Systems   Review of Systems  Constitutional: Negative for appetite change, diaphoresis, fatigue, fever and unexpected weight change.  HENT: Negative for mouth sores.   Eyes: Positive for visual disturbance.  Respiratory: Negative for cough, chest tightness, shortness of breath and wheezing.   Cardiovascular: Negative for chest pain.  Gastrointestinal: Negative for abdominal pain, constipation, diarrhea, nausea and vomiting.  Endocrine: Negative for polydipsia, polyphagia and polyuria.  Genitourinary: Negative for dysuria, frequency,  hematuria and urgency.  Musculoskeletal: Positive for gait problem. Negative for back pain and neck stiffness.  Skin: Positive for wound. Negative for rash.  Allergic/Immunologic: Negative for immunocompromised state.  Neurological: Positive for dizziness and headaches. Negative for syncope and light-headedness.  Hematological: Does not bruise/bleed easily.  Psychiatric/Behavioral: Negative for sleep disturbance. The patient is not nervous/anxious.     Physical Exam Updated Vital Signs BP (!) 129/92 (BP Location: Right Arm)   Pulse (!) 110   Temp 98.2 F (36.8 C) (Oral)   Resp 18   Ht 5\' 6"  (1.676 m)   Wt 65 kg   SpO2 99%   BMI 23.13 kg/m   Physical Exam Vitals and nursing note reviewed.  Constitutional:      General: She is not in acute distress.    Appearance: She is well-developed. She is not diaphoretic.  HENT:     Head: Normocephalic.   Eyes:     General: Lids are normal. No scleral icterus.    Conjunctiva/sclera: Conjunctivae normal.     Pupils: Pupils are equal, round, and reactive to light.     Comments: No horizontal, vertical or rotational nystagmus  Neck:     Comments: Full active and passive ROM without pain No midline or paraspinal tenderness No nuchal rigidity or meningeal signs Cardiovascular:     Rate and Rhythm: Regular rhythm. Tachycardia present.  Pulmonary:     Effort: Pulmonary effort is normal. No respiratory distress.  Abdominal:     General: There is no distension.     Palpations: Abdomen is soft.     Tenderness: There is no abdominal tenderness. There is no guarding or rebound.  Musculoskeletal:        General: Normal range of motion.     Cervical back: Normal range of motion and neck supple. No pain with movement, spinous process tenderness or muscular tenderness.     Comments: Moves all extremities without difficulty or ataxia  Lymphadenopathy:     Cervical: No cervical adenopathy.  Skin:    General: Skin is warm and dry.     Findings:  No rash.     Comments: Marks on the left arm.  Patient reports she recently injected Jamul multiple times.  Neurological:     Mental Status: She is alert and oriented to person, place, and time.     Cranial Nerves: No cranial nerve deficit.     Motor: No abnormal muscle tone.     Coordination: Coordination normal.     Comments: Mental Status:  Alert, oriented, thought content appropriate. Speech fluent without evidence of aphasia. Able to follow 2 step commands without difficulty.  Cranial Nerves:  II:  pupils equal, round, reactive to light III,IV, VI: ptosis not present, extra-ocular motions intact bilaterally  V,VII: smile symmetric, facial light touch sensation equal VIII: hearing grossly normal bilaterally  IX,X: midline uvula rise  XI: bilateral shoulder shrug equal and strong XII: midline tongue extension  Motor:  5/5 in upper  and lower extremities bilaterally including strong and equal grip strength and dorsiflexion/plantar flexion Sensory: light touch normal in all extremities.  Gait: gait testing deferred CV: distal pulses palpable throughout   Psychiatric:        Behavior: Behavior normal.        Thought Content: Thought content normal.        Judgment: Judgment normal.     ED Results / Procedures / Treatments   Labs (all labs ordered are listed, but only abnormal results are displayed) Labs Reviewed  BASIC METABOLIC PANEL - Abnormal; Notable for the following components:      Result Value   Potassium 3.1 (*)    CO2 20 (*)    Glucose, Bld 130 (*)    All other components within normal limits  CBC WITH DIFFERENTIAL/PLATELET  I-STAT BETA HCG BLOOD, ED ([facility], [facility], [facility] ONLY)     Radiology CT Head Wo Contrast  Result Date: 04/22/2019 CLINICAL DATA:  Hit head against window, headache EXAM: CT HEAD WITHOUT CONTRAST TECHNIQUE: Contiguous axial images were obtained from the base of the skull through the vertex without intravenous contrast. COMPARISON:  None. FINDINGS:  Brain: No evidence of acute territorial infarction, hemorrhage, hydrocephalus,extra-axial collection or mass lesion/mass effect. Normal gray-white differentiation. Ventricles are normal in size and contour. Vascular: No hyperdense vessel or unexpected calcification. Skull: The skull is intact. No fracture or focal lesion identified. Sinuses/Orbits: The visualized paranasal sinuses and mastoid air cells are clear. The orbits and globes intact. Other: None IMPRESSION: No acute intracranial abnormality. Electronically Signed   By: Prudencio Pair M.D.   On: 04/22/2019 02:54    Procedures Procedures (including critical care time)  Medications Ordered in ED Medications  potassium chloride SA (KLOR-CON) CR tablet 40 mEq (has no administration in time range)  sodium chloride 0.9 % bolus 500 mL (0 mLs Intravenous Stopped 04/22/19 0355)  prochlorperazine (COMPAZINE) injection 10 mg (10 mg Intravenous Given 04/22/19 0210)  diphenhydrAMINE (BENADRYL) injection 12.5 mg (12.5 mg Intravenous Given 04/22/19 0210)  ketorolac (TORADOL) 30 MG/ML injection 30 mg (30 mg Intravenous Given 04/22/19 0355)    ED Course  I have reviewed the triage vital signs and the nursing notes.  Pertinent labs & imaging results that were available during my care of the patient were reviewed by me and considered in my medical decision making (see chart for details).    MDM Rules/Calculators/A&P                       Patient presents with posttraumatic headache.  She is complaining of some associated dizziness and vision changes.  Normal neurologic exam without ataxia.  CT scan of her head without acute abnormality including no intracranial hemorrhage or skull fracture.  Patient given Toradol, Compazine and Benadryl.  She has been able to rest here in the emergency department and reports she is feeling much better.  Symptoms have improved significantly.  She is able to ambulate without assistance.  Her tachycardia has resolved.  Mild  hypokalemia noted.  Patient will be given p.o. potassium here in the emergency room.  Concussion precautions and return precautions discussed.  Patient states understanding and is in agreement with the plan.   BP 97/60 (BP Location: Left Arm)   Pulse 71   Temp 97.8 F (36.6 C) (Oral)   Resp 16   Ht 5\' 6"  (1.676 m)   Wt 65 kg   SpO2 100%   BMI 23.13 kg/m    Final  Clinical Impression(s) / ED Diagnoses Final diagnoses:  Minor head injury, initial encounter  Acute post-traumatic headache, not intractable  Hypokalemia    Rx / DC Orders ED Discharge Orders    None       Charlesetta Milliron, Boyd Kerbs 04/22/19 9528    Dione Booze, MD 04/22/19 [phone]

## 2019-04-22 NOTE — ED Notes (Signed)
Patient verbalizes understanding of discharge instructions. Opportunity for questioning and answers were provided. Armband removed by staff, pt discharged from ED. Ambulated out to lobby  

## 2019-04-22 NOTE — ED Triage Notes (Signed)
Patient reports headache onset yesterday morning , she accidentally hit her head against the corner of the window sill , denies LOC/ambulatory .

## 2019-04-22 NOTE — ED Notes (Signed)
This NT ambulated pt through the hallway, once around the nurses' station. Pt tolerated ambulation without issue and had no reports of SOB, faintness, or nausea.

## 2019-04-22 NOTE — ED Notes (Signed)
Pt transported to CT ?

## 2019-04-22 NOTE — Discharge Instructions (Addendum)
1. Medications: Ibuprofen or Tylenol for pain 2. Treatment: Rest, ice on head.  Concussion precautions given - keep patient in a quiet, not simulating, dark environment. No TV, computer use, video games until headache is resolved completely. No contact sports until cleared by the physician. 3. Follow Up: With primary care physician on Monday if headache persists.  Return to the emergency department if patient becomes lethargic, begins vomiting, develops double vision, speech difficulty, problems walking or other change in mental status.  

## 2019-05-30 ENCOUNTER — Ambulatory Visit (HOSPITAL_COMMUNITY)
Admission: EM | Admit: 2019-05-30 | Discharge: 2019-05-30 | Disposition: A | Discharge status: Home / Self Care | Payer: Self-pay | Attending: Family Medicine | Admitting: Family Medicine

## 2019-05-30 ENCOUNTER — Other Ambulatory Visit: Payer: Self-pay

## 2019-05-30 ENCOUNTER — Encounter (HOSPITAL_COMMUNITY): Payer: Self-pay

## 2019-05-30 DIAGNOSIS — L739 Follicular disorder, unspecified: Secondary | ICD-10-CM

## 2019-05-30 MED ORDER — DOXYCYCLINE HYCLATE 100 MG PO CAPS: 100.0000 mg | ORAL_CAPSULE | Freq: Two times a day (BID) | ORAL | 0 refills | Status: DC

## 2019-05-30 NOTE — ED Triage Notes (Signed)
Pt presents to UC with itchy rash in her legs and right thigh x 2 months.

## 2019-06-03 NOTE — ED Provider Notes (Signed)
Alejandra Park   [id number] 05/30/19 Arrival Time: 6767  ASSESSMENT & PLAN:  1. Folliculitis     Begin: Meds ordered this encounter  Medications  . doxycycline (VIBRAMYCIN) 100 MG capsule    Sig: Take 1 capsule (100 mg total) by mouth 2 (two) times daily.    Dispense:  20 capsule    Refill:  0    Follow-up [facility].   Specialty: Urgent Care Contact information: [place]          Reviewed expectations re: course of current medical issues. Questions answered. Outlined signs and symptoms indicating need for more acute intervention. Patient verbalized understanding. After Visit Summary given.   SUBJECTIVE:  Alejandra Park is a 34 y.o. female who presents with a skin complaint.   Location: bilateral legs; mostly below knees Onset: gradual Duration: at least a month; places appears then slowly go away Associated pruritis? minimal Associated pain? minimal Progression: fluctuating a bit  Drainage? none Known trigger? No  New soaps/lotions/topicals/detergents? No  Environmental exposures? No  Contacts with similar? No  Recent travel? No  Other associated symptoms: none Therapies tried thus far: none Arthralgia or myalgia? none Recent illness? none Fever? none New medications? none No specific aggravating or alleviating factors reported.   OBJECTIVE: Vitals:   05/30/19 1051  BP: (!) 104/59  Pulse: 84  Resp: 17  Temp: 98.1 F (36.7 C)  TempSrc: Oral  SpO2: 96%    General appearance: alert; no distress HEENT: [place]; AT Neck: supple with FROM Lungs: clear to auscultation bilaterally Heart: regular rate and rhythm Extremities: no edema; moves all extremities normally Skin: warm and dry; folliculitis of lower extremities; scattered; no areas of fluctuance, active drainage or bleeding Psychological: alert and cooperative; normal mood and  affect  No Known Allergies  Past Medical History:  Diagnosis Date  . Acute renal failure (ARF) ([place]) 01/24/2016   Due to rhabdomyolysis, required HD for 10 days then fully recovered  . History of abnormal cervical Pap smear 03/20/2016  . Moderate opioid use disorder ([place])   . Rhabdomyolysis 01/24/2016   Traumatic rhabdo following opioid overdose   Social History   Socioeconomic History  . Marital status: Single    Spouse name: Not on file  . Number of children: Not on file  . Years of education: Not on file  . Highest education level: Not on file  Occupational History  . Not on file  Tobacco Use  . Smoking status: Current Every Day Smoker    Packs/day: 0.50  . Smokeless tobacco: Current User  Substance and Sexual Activity  . Alcohol use: Yes  . Drug use: Yes    Comment: Percocet and Heroin  . Sexual activity: Yes    Partners: Male    Birth control/protection: Surgical  Other Topics Concern  . Not on file  Social History Narrative  . Not on file   Social Determinants of Health   Financial Resource Strain:   . Difficulty of Paying Living Expenses:   Food Insecurity:   . Worried About Charity fundraiser in the Last Year:   . Arboriculturist in the Last Year:   Transportation Needs:   . Film/video editor (Medical):   Marland Kitchen Lack of Transportation (Non-Medical):   Physical Activity:   . Days of Exercise per Week:   . Minutes of Exercise per Session:   Stress:   .  Feeling of Stress :   Social Connections:   . Frequency of Communication with Friends and Family:   . Frequency of Social Gatherings with Friends and Family:   . Attends Religious Services:   . Active Member of Clubs or Organizations:   . Attends Banker Meetings:   Marland Kitchen Marital Status:   Intimate Partner Violence:   . Fear of Current or Ex-Partner:   . Emotionally Abused:   Marland Kitchen Physically Abused:   . Sexually Abused:    Family History  Problem Relation Age of Onset  . Healthy Mother    . Healthy Father    Past Surgical History:  Procedure Laterality Date  . INSERTION OF DIALYSIS CATHETER N/A 02/04/2016   Procedure: INSERTION OF RIGHT INTERNAL JUGULAR DIALYSIS CATHETER;  Surgeon: Maeola Harman, MD;  Location: [facility] OR;  Service: Vascular;  Laterality: N/A;  . IR GENERIC HISTORICAL  01/25/2016   IR US GUIDE VASC ACCESS RIGHT 01/25/2016 Berdine Dance, MD [facility]  . IR GENERIC HISTORICAL  01/25/2016   IR FLUORO GUIDE CV LINE RIGHT 01/25/2016 Berdine Dance, MD [facility]  . KNEE SURGERY    . TUBAL LIGATION       Mardella Layman, MD 06/03/19 1016

## 2022-10-30 ENCOUNTER — Ambulatory Visit (HOSPITAL_COMMUNITY)
Admission: EM | Admit: 2022-10-30 | Discharge: 2022-10-31 | Disposition: A | Discharge status: Home / Self Care | Payer: No Typology Code available for payment source | Attending: Nurse Practitioner | Admitting: Nurse Practitioner

## 2022-10-30 DIAGNOSIS — F15159 Other stimulant abuse with stimulant-induced psychotic disorder, unspecified: Secondary | ICD-10-CM | POA: Insufficient documentation

## 2022-10-30 DIAGNOSIS — Z5901 Sheltered homelessness: Secondary | ICD-10-CM | POA: Insufficient documentation

## 2022-10-30 DIAGNOSIS — F1510 Other stimulant abuse, uncomplicated: Secondary | ICD-10-CM

## 2022-10-30 DIAGNOSIS — F1910 Other psychoactive substance abuse, uncomplicated: Secondary | ICD-10-CM

## 2022-10-30 DIAGNOSIS — Z560 Unemployment, unspecified: Secondary | ICD-10-CM | POA: Insufficient documentation

## 2022-10-30 DIAGNOSIS — F19959 Other psychoactive substance use, unspecified with psychoactive substance-induced psychotic disorder, unspecified: Secondary | ICD-10-CM

## 2022-10-30 MED ORDER — MAGNESIUM HYDROXIDE 400 MG/5ML PO SUSP: 30.0000 mL | Freq: Every day | ORAL | Status: DC | PRN

## 2022-10-30 MED ORDER — ALUM & MAG HYDROXIDE-SIMETH 200-200-20 MG/5ML PO SUSP: 30.0000 mL | ORAL | Status: DC | PRN

## 2022-10-30 MED ORDER — HYDROXYZINE HCL 25 MG PO TABS
25.0000 mg | ORAL_TABLET | Freq: Three times a day (TID) | ORAL | Status: DC | PRN
  Filled 2022-10-30: qty 1

## 2022-10-30 MED ORDER — ACETAMINOPHEN 325 MG PO TABS
650.0000 mg | ORAL_TABLET | Freq: Four times a day (QID) | ORAL | Status: DC | PRN
  Filled 2022-10-30: qty 2

## 2022-10-30 MED ORDER — TRAZODONE HCL 50 MG PO TABS: 50.0000 mg | ORAL_TABLET | Freq: Every evening | ORAL | Status: DC | PRN

## 2022-10-30 NOTE — Progress Notes (Signed)
   10/30/22 2003  [facility] Triage Screening (Walk-ins at [facility] only)  How Did You Hear About [place]? Family/Friend  What Is the Reason for Your Visit/Call Today? Alejandra Park is a 37 year old female presenting as a voluntary walk-in to [facility]. Patient denied SI, HI and psychosis. Patient states "I am here for my ankle, don't know why, family told me its best, I did it". Patient references "if I don't understand time changes". Patient seemed to be under the influence, as she stated she last used a substance earlier "I don't know it had properties of salt mineral base". Patient verbal communication was unclear at times and was repetitive. Patient unclear of treatment needed.  How Long Has This Been Causing You Problems? <Week  Have You Recently Had Any Thoughts About Hurting Yourself? No  Are You Planning to Commit Suicide/Harm Yourself At This time? No  Have you Recently Had Thoughts About Hurting Someone [name]? No  Are You Planning To Harm Someone At This Time? No  Are you currently experiencing any auditory, visual or other hallucinations? No  Have You Used Any Alcohol or Drugs in the Past 24 Hours? Yes  How long ago did you use Drugs or Alcohol? today  What Did You Use and How Much? "salt mineral base, not sure what it was"  Do you have any current medical co-morbidities that require immediate attention? No  Clinician description of patient physical appearance/behavior: neat / cooperative  What Do You Feel Would Help You the Most Today? Treatment for Depression or other mood problem  If access to [facility] was not available, would you have sought care in the Emergency Department? No  Determination of Need Routine (7 days)  Options For Referral Outpatient Therapy;Medication Management    Flowsheet Row ED from 10/30/2022 in [facility]  C-SSRS RISK CATEGORY No Risk

## 2022-10-30 NOTE — ED Provider Notes (Signed)
[facility] Continuous Assessment Admission H&P  Date: 10/31/22 Patient Name: Alejandra Park MRN: [id number] Chief Complaint:substance use  Diagnoses:  Final diagnoses:  Polysubstance abuse [facility])    HPI: Alejandra Park is a 37 year old unemployed female presenting voluntarily to [facility] accompanied by a friend Alejandra Park.  Patient reports a history of heroin, cocaine, alcohol and methamphetamine use.  Patient states today that she used methamphetamine today.  Nurse practitioner assessed patient face-to-face and reviewed her chart. Patient is alert oriented x3, calm and cooperative, speech is clear and coherent. Patient mood is depressed and slight anxious with coherent mood. Patient reports that she wants to get help for her substance abuse and mental health.   Patient's friend reports that patient was dropped off at a rest stop in [place], [place] and called her friend to come pick her up. Patient reports that she received some mental health services while in prison and received detox treatment. Patient denies having a medication provider or therapist.   Patient endorses poor sleep, depressed, worthless, and hopeless. Patient denies any SI/HI or AVH.  Patient will be admitted to [facility] continuous observation.  Total Time spent with patient: 20 minutes  Musculoskeletal  Strength & Muscle Tone: within normal limits Gait & Station: normal Patient leans: N/A  Psychiatric Specialty Exam  Presentation General Appearance:  Casual  Eye Contact: Fair  Speech: Clear and Coherent  Speech Volume: Normal  Handedness: Right   Mood and Affect  Mood: Anxious  Affect: Flat   Thought Process  Thought Processes: Disorganized  Descriptions of Associations:Tangential  Orientation:Full (Time, Place and Person)  Thought Content:WDL    Hallucinations:Hallucinations: None  Ideas of Reference:None  Suicidal Thoughts:Suicidal Thoughts: No  Homicidal Thoughts:Homicidal  Thoughts: No   Sensorium  Memory: Immediate Fair; Recent Fair; Remote Fair  Judgment: Poor  Insight: Poor   Executive Functions  Concentration: Poor  Attention Span: Poor  Recall: [employer] of Knowledge: Fair  Language: Fair   Psychomotor Activity  Psychomotor Activity: Psychomotor Activity: Normal   Assets  Assets: Communication Skills; Physical Health; Resilience; Social Support   Sleep  Sleep: Sleep: Poor Number of Hours of Sleep: 4   Nutritional Assessment (For OBS and FBC admissions only) Has the patient had a weight loss or gain of 10 pounds or more in the last 3 months?: No Has the patient had a decrease in food intake/or appetite?: No Does the patient have dental problems?: No Does the patient have eating habits or behaviors that may be indicators of an eating disorder including binging or inducing vomiting?: No Has the patient recently lost weight without trying?: 0 Has the patient been eating poorly because of a decreased appetite?: 0 Malnutrition Screening Tool Score: 0    Physical Exam Vitals reviewed.  Constitutional:      Appearance: Normal appearance.  HENT:     Head: Normocephalic and atraumatic.     Nose: Nose normal.  Eyes:     Pupils: Pupils are equal, round, and reactive to light.  Cardiovascular:     Rate and Rhythm: Normal rate.  Pulmonary:     Effort: Pulmonary effort is normal.  Abdominal:     General: Abdomen is flat.  Musculoskeletal:        General: Normal range of motion.     Cervical back: Normal range of motion.  Skin:    General: Skin is warm.  Neurological:     Mental Status: She is alert and oriented to person, place,  and time.  Psychiatric:        Attention and Perception: She is inattentive.        Mood and Affect: Mood is anxious. Affect is flat.        Speech: Speech is delayed and tangential.        Behavior: Behavior is cooperative.        Thought Content: Thought content is delusional.  Thought content includes homicidal and suicidal ideation. Thought content includes homicidal and suicidal plan.        Cognition and Memory: Cognition normal.        Judgment: Judgment is impulsive.    Review of Systems  Constitutional: Negative.   HENT: Negative.    Eyes: Negative.   Respiratory: Negative.    Cardiovascular: Negative.   Gastrointestinal: Negative.   Genitourinary: Negative.   Musculoskeletal: Negative.   Skin: Negative.   Neurological: Negative.   Endo/Heme/Allergies: Negative.   Psychiatric/Behavioral:  Positive for substance abuse. The patient is nervous/anxious.     Blood pressure 124/80, pulse 95, temperature 98.3 F (36.8 C), temperature source Oral, resp. rate 17, SpO2 100%. There is no height or weight on file to calculate BMI.  Past Psychiatric History: Detox facility  Is the patient at risk to self? No  Has the patient been a risk to self in the past 6 months? No .    Has the patient been a risk to self within the distant past? No   Is the patient a risk to others? No   Has the patient been a risk to others in the past 6 months? No   Has the patient been a risk to others within the distant past? No   Past Medical History:Detox treatment  Family History: NO family hx  Social History: 37 y/o  homeless unemployed substance use  Last Labs:  Admission on 10/30/2022  Component Date Value Ref Range Status   WBC 10/31/2022 8.4  4.0 - 10.5 K/uL Final   RBC 10/31/2022 4.50  3.87 - 5.11 MIL/uL Final   Hemoglobin 10/31/2022 13.7  12.0 - 15.0 g/dL Final   HCT 16/11/9602 42.2  36.0 - 46.0 % Final   MCV 10/31/2022 93.8  80.0 - 100.0 fL Final   MCH 10/31/2022 30.4  26.0 - 34.0 pg Final   MCHC 10/31/2022 32.5  30.0 - 36.0 g/dL Final   RDW 54/10/8117 12.6  11.5 - 15.5 % Final   Platelets 10/31/2022 334  150 - 400 K/uL Final   nRBC 10/31/2022 0.0  0.0 - 0.2 % Final   Neutrophils Relative % 10/31/2022 62  % Final   Neutro Abs 10/31/2022 5.3  1.7 - 7.7 K/uL  Final   Lymphocytes Relative 10/31/2022 28  % Final   Lymphs Abs 10/31/2022 2.3  0.7 - 4.0 K/uL Final   Monocytes Relative 10/31/2022 7  % Final   Monocytes Absolute 10/31/2022 0.6  0.1 - 1.0 K/uL Final   Eosinophils Relative 10/31/2022 2  % Final   Eosinophils Absolute 10/31/2022 0.2  0.0 - 0.5 K/uL Final   Basophils Relative 10/31/2022 1  % Final   Basophils Absolute 10/31/2022 0.0  0.0 - 0.1 K/uL Final   Immature Granulocytes 10/31/2022 0  % Final   Abs Immature Granulocytes 10/31/2022 0.02  0.00 - 0.07 K/uL Final   Performed at [facility], [address] [place], [place] [address]   Sodium 10/31/2022 139  135 - 145 mmol/L Final   Potassium 10/31/2022 4.3  3.5 -  5.1 mmol/L Final   Chloride 10/31/2022 101  98 - 111 mmol/L Final   CO2 10/31/2022 25  22 - 32 mmol/L Final   Glucose, Bld 10/31/2022 89  70 - 99 mg/dL Final   Glucose reference range applies only to samples taken after fasting for at least 8 hours.   BUN 10/31/2022 12  6 - 20 mg/dL Final   Creatinine, Ser 10/31/2022 0.62  0.44 - 1.00 mg/dL Final   Calcium 16/11/9602 9.2  8.9 - 10.3 mg/dL Final   Total Protein 54/10/8117 6.5  6.5 - 8.1 g/dL Final   Albumin 14/78/2956 3.5  3.5 - 5.0 g/dL Final   AST 21/30/8657 17  15 - 41 U/L Final   ALT 10/31/2022 18  0 - 44 U/L Final   Alkaline Phosphatase 10/31/2022 60  38 - 126 U/L Final   Total Bilirubin 10/31/2022 0.4  0.3 - 1.2 mg/dL Final   GFR, Estimated 10/31/2022 >60  >60 mL/min Final   Comment: (NOTE) Calculated using the CKD-EPI Creatinine Equation (2021)    Anion gap 10/31/2022 13  5 - 15 Final   Performed at [facility], [address] [place], [place] [address]   Alcohol, Ethyl (B) 10/31/2022 <10  <10 mg/dL Final   Comment: (NOTE) Lowest detectable limit for serum alcohol is 10 mg/dL.  For medical purposes only. Performed at [facility], [address], [place], [place] [address]     Allergies: Patient has no known allergies.  Medications:   Facility Ordered Medications  Medication   acetaminophen (TYLENOL) tablet 650 mg   alum & mag hydroxide-simeth (MAALOX/MYLANTA) 200-200-20 MG/5ML suspension 30 mL   magnesium hydroxide (MILK OF MAGNESIA) suspension 30 mL   traZODone (DESYREL) tablet 50 mg   hydrOXYzine (ATARAX) tablet 25 mg   PTA Medications  Medication Sig   Buprenorphine HCl-Naloxone HCl (SUBOXONE) 8-2 MG FILM Take 2 films in the morning and 1/2 film in the evening.   meloxicam (MOBIC) 7.5 MG tablet Take 1 tablet (7.5 mg total) by mouth daily.   HYDROcodone-acetaminophen (NORCO) 5-325 MG tablet Take 1 tablet by mouth every 6 (six) hours as needed for moderate pain.   doxycycline (VIBRAMYCIN) 100 MG capsule Take 1 capsule (100 mg total) by mouth 2 (two) times daily.      Medical Decision Making   Alejandra Park is a 37 year old female presenting voluntarily to [facility] accompanied by a friend Alejandra Park.  Patient reports a history of heroin, cocaine, alcohol and methamphetamine use.  Patient states today that she used methamphetamine today.    Recommendations  Based on my evaluation patient does not appear to be experiencing a medical emergency. Patient recommended for inpatient treatment but will be admitted to [facility] continuous observation.  Jasper Riling, NP 10/31/22  7:08 AM

## 2022-10-31 ENCOUNTER — Other Ambulatory Visit: Payer: Self-pay

## 2022-10-31 DIAGNOSIS — F32A Depression, unspecified: Secondary | ICD-10-CM | POA: Diagnosis present

## 2022-10-31 DIAGNOSIS — F15159 Other stimulant abuse with stimulant-induced psychotic disorder, unspecified: Secondary | ICD-10-CM | POA: Diagnosis not present

## 2022-10-31 DIAGNOSIS — F1510 Other stimulant abuse, uncomplicated: Secondary | ICD-10-CM | POA: Diagnosis not present

## 2022-10-31 DIAGNOSIS — F19959 Other psychoactive substance use, unspecified with psychoactive substance-induced psychotic disorder, unspecified: Secondary | ICD-10-CM | POA: Diagnosis not present

## 2022-10-31 DIAGNOSIS — Z5901 Sheltered homelessness: Secondary | ICD-10-CM | POA: Diagnosis not present

## 2022-10-31 DIAGNOSIS — R410 Disorientation, unspecified: Secondary | ICD-10-CM | POA: Diagnosis present

## 2022-10-31 DIAGNOSIS — F1910 Other psychoactive substance abuse, uncomplicated: Secondary | ICD-10-CM | POA: Diagnosis present

## 2022-10-31 DIAGNOSIS — Z560 Unemployment, unspecified: Secondary | ICD-10-CM | POA: Diagnosis not present

## 2022-10-31 LAB — CBC WITH DIFFERENTIAL/PLATELET
Abs Immature Granulocytes: 0.02 10*3/uL (ref 0.00–0.07)
Basophils Absolute: 0 10*3/uL (ref 0.0–0.1)
Basophils Relative: 1 %
Eosinophils Absolute: 0.2 10*3/uL (ref 0.0–0.5)
Eosinophils Relative: 2 %
HCT: 42.2 % (ref 36.0–46.0)
Hemoglobin: 13.7 g/dL (ref 12.0–15.0)
Immature Granulocytes: 0 %
Lymphocytes Relative: 28 %
Lymphs Abs: 2.3 10*3/uL (ref 0.7–4.0)
MCH: 30.4 pg (ref 26.0–34.0)
MCHC: 32.5 g/dL (ref 30.0–36.0)
MCV: 93.8 fL (ref 80.0–100.0)
Monocytes Absolute: 0.6 10*3/uL (ref 0.1–1.0)
Monocytes Relative: 7 %
Neutro Abs: 5.3 10*3/uL (ref 1.7–7.7)
Neutrophils Relative %: 62 %
Platelets: 334 10*3/uL (ref 150–400)
RBC: 4.5 MIL/uL (ref 3.87–5.11)
RDW: 12.6 % (ref 11.5–15.5)
WBC: 8.4 10*3/uL (ref 4.0–10.5)
nRBC: 0 % (ref 0.0–0.2)

## 2022-10-31 LAB — COMPREHENSIVE METABOLIC PANEL
ALT: 18 U/L (ref 0–44)
AST: 17 U/L (ref 15–41)
Albumin: 3.5 g/dL (ref 3.5–5.0)
Alkaline Phosphatase: 60 U/L (ref 38–126)
Anion gap: 13 (ref 5–15)
BUN: 12 mg/dL (ref 6–20)
CO2: 25 mmol/L (ref 22–32)
Calcium: 9.2 mg/dL (ref 8.9–10.3)
Chloride: 101 mmol/L (ref 98–111)
Creatinine, Ser: 0.62 mg/dL (ref 0.44–1.00)
GFR, Estimated: >60 mL/min (ref >60)
Glucose, Bld: 89 mg/dL (ref 70–99)
Potassium: 4.3 mmol/L (ref 3.5–5.1)
Sodium: 139 mmol/L (ref 135–145)
Total Bilirubin: 0.4 mg/dL (ref 0.3–1.2)
Total Protein: 6.5 g/dL (ref 6.5–8.1)

## 2022-10-31 LAB — ETHANOL: Alcohol, Ethyl (B): <10 mg/dL (ref <10)

## 2022-10-31 LAB — POC URINE PREG, ED: Preg Test, Ur: NEGATIVE

## 2022-10-31 LAB — POCT URINE DRUG SCREEN - MANUAL ENTRY (I-SCREEN)
POC Amphetamine UR: NOT DETECTED
POC Buprenorphine (BUP): NOT DETECTED
POC Cocaine UR: NOT DETECTED
POC Marijuana UR: NOT DETECTED
POC Methadone UR: NOT DETECTED
POC Methamphetamine UR: POSITIVE — AB
POC Morphine: NOT DETECTED
POC Oxazepam (BZO): NOT DETECTED
POC Oxycodone UR: NOT DETECTED
POC Secobarbital (BAR): NOT DETECTED

## 2022-10-31 MED ORDER — OLANZAPINE 5 MG PO TBDP: 5.0000 mg | ORAL_TABLET | Freq: Every day | ORAL | Status: DC

## 2022-10-31 MED ORDER — OLANZAPINE 5 MG PO TABS
5.0000 mg | ORAL_TABLET | Freq: Once | ORAL | Status: DC
Start: 2022-10-31 — End: 2022-10-31

## 2022-10-31 MED ORDER — OLANZAPINE 5 MG PO TABS: 5.0000 mg | ORAL_TABLET | Freq: Every day | ORAL | Status: DC

## 2022-10-31 NOTE — ED Notes (Signed)
Pt observed/assessed in recliner sleeping. RR even and unlabored, appearing in no noted distress. Environmental check complete, will continue to monitor for safety 

## 2022-10-31 NOTE — ED Notes (Signed)
Patient oriented to unit.  Will continue to monitor for safety

## 2022-10-31 NOTE — ED Notes (Addendum)
pt c/o HA 4/10. Offered pt PRN Acetaminophen, pt refused. pt reports anxiety and agitation, offered pt PRN Atarax, pt refused medication.

## 2022-10-31 NOTE — ED Notes (Signed)

## 2022-10-31 NOTE — ED Provider Notes (Signed)
FBC/OBS ASAP Discharge Summary  Date and Time: 10/31/2022 10:18 AM  Name: Alejandra Park  MRN:  [id number]   Discharge Diagnoses:  Final diagnoses:  Substance-induced psychotic disorder [facility])  Methamphetamine abuse (HCC)    Subjective: Patient seen and evaluated face-to-face by this provider, and chart reviewed. Per chart review, patient has an extensive opioid disorder history. On evaluation, patient is seated in no acute distress. Her eye contact is minimal. Her thought process coherent with circumstantial association. Her speech is slow with delayed responses. She appears to have some thought blocking. Her mood is irritable. She denies SI/HI/AVH. She denies depressive, anxiety amd manic symptoms. She denies a past psychiatric history, no inpatient or outpatient psychiatric treatment. Patient states that she does not know why she is here. She states that her mother wanted her to come here since July. She does not state why her mother wanted her to come here. She states that she was living in [place] with an ex-boyfriend until he kicked her out. She states that she has been living in motels for the past month with a friend. She states that she is homeless. She states that she was arrested a couple weeks ago for a failure to appear in court and was released 2-3 days later. She states that she has an court date on 11/02/22. She denies using illicit drugs and states that she uses "mineral." She would not further elaborate on the type of minerals she's consuming. Patient's UDS positive for methamphetamines. Per nursing, patient demanded to leave. Patient does not meet Dennard IVC criteria. Patient will be discharged with outpatient resources for substance abuse and psychiatry.   Patient gives verbal consent for this provider to speak with her mother Alejandra Park [phone]. Alejandra Park states that the patient has a long history of abusing drugs, mostly heroin, methamphetamine and fentanyl. She states that the  patient had an overdose 5-6 years ago. She states that she last saw the patient two months ago and she was talking about her boyfriend's family was trying to saw her head open, seeing lights ascending and descending, and thinking that people where following her. She states that the patient has never been diagnosed with a psychiatric disorder because she's never been evaluated. She states that the patient has three children and that she has custody of her two daughters ages 57 and 72 years old, and states that the patient's 45 year old son is with his father. She was advised that the patient is minimizing her drugs use and does not consent for treatment at this time. Alejandra Park states that she can pick the patient up today when she gets off at 9 pm or the patient can call her friend who brought her here to pick her up. She states that the patient is allowed at her home only when she is there because the patient can not be alone with the children per court order. Safety planning completed.   Stay Summary: Per triage note on 10/30/22: Alejandra Park is a 37 year old female presenting as a voluntary walk-in to [facility]. Patient denied SI, HI and psychosis. Patient states "I am here for my ankle, don't know why, family told me its best, I did it". Patient references "if I don't understand time changes". Patient seemed to be under the influence, as she stated she last used a substance earlier "I don't know it had properties of salt mineral base". Patient verbal communication was unclear at times and was repetitive. Patient unclear of  treatment needed.   10/31/22 Update: Patient was admitted to the [facility] continuous assessment unit overnight for observation. She was observed overnight without any disruptive, aggressive, or self harm behaviors. However, she did exhibit some agitation about being here. She demanded to be discharge today and would not consent for substance abuse treatment on my exam. Patient does not meet Moro IVC  criteria and there is not indication at this time that the patient is a danger to herself or other.   Total Time spent with patient: 30 minutes  Past Psychiatric History: No significant past psychiatric history other than polysubstance abuse, reported hx of heroin, cocaine, and methamphetamine use.  Past Medical History: No history reported.   Family History: No known history.   Family Psychiatric History: No known history.   Social History: Patient is homeless, unemployed and has three children (19, 32, 83) who are not in her custody. Patient reports an upcoming court date on 11/02/22 for trespassing.   Tobacco Cessation:  A prescription for an FDA-approved tobacco cessation medication was offered at discharge and the patient refused  Current Medications:  Current Facility-Administered Medications  Medication Dose Route Frequency Provider Last Rate Last Admin   acetaminophen (TYLENOL) tablet 650 mg  650 mg Oral Q6H PRN Bobbitt, Shalon E, NP       alum & mag hydroxide-simeth (MAALOX/MYLANTA) 200-200-20 MG/5ML suspension 30 mL  30 mL Oral Q4H PRN Bobbitt, Shalon E, NP       hydrOXYzine (ATARAX) tablet 25 mg  25 mg Oral TID PRN Bobbitt, Shalon E, NP       magnesium hydroxide (MILK OF MAGNESIA) suspension 30 mL  30 mL Oral Daily PRN Bobbitt, Shalon E, NP       OLANZapine zydis (ZYPREXA) disintegrating tablet 5 mg  5 mg Oral Daily Teague Goynes L, NP       traZODone (DESYREL) tablet 50 mg  50 mg Oral QHS PRN Bobbitt, Shalon E, NP       No current outpatient medications on file.    PTA Medications:  Facility Ordered Medications  Medication   acetaminophen (TYLENOL) tablet 650 mg   alum & mag hydroxide-simeth (MAALOX/MYLANTA) 200-200-20 MG/5ML suspension 30 mL   magnesium hydroxide (MILK OF MAGNESIA) suspension 30 mL   traZODone (DESYREL) tablet 50 mg   hydrOXYzine (ATARAX) tablet 25 mg   OLANZapine zydis (ZYPREXA) disintegrating tablet 5 mg       11/26/2016    2:53 PM 10/27/2016     9:44 AM 09/01/2016    9:16 AM  Depression screen PHQ 2/9  Decreased Interest 0 0 0  Down, Depressed, Hopeless 0 0 0  PHQ - 2 Score 0 0 0  Altered sleeping   0  Tired, decreased energy   0  Change in appetite   0  Feeling bad or failure about yourself    0  Trouble concentrating   0  Moving slowly or fidgety/restless   0  Suicidal thoughts   0  PHQ-9 Score   0    Flowsheet Row ED from 10/30/2022 in [facility]  C-SSRS RISK CATEGORY No Risk       Musculoskeletal  Strength & Muscle Tone: within normal limits Gait & Station: normal Patient leans: N/A  Psychiatric Specialty Exam  Presentation  General Appearance:  Disheveled  Eye Contact: Minimal  Speech: Slow  Speech Volume: Decreased  Handedness: Right   Mood and Affect  Mood: Irritable  Affect: Congruent   Thought Process  Thought Processes: Coherent  Descriptions of Associations:Circumstantial  Orientation:Full (Time, Place and Person)  Thought Content:Paranoid Ideation  Diagnosis of Schizophrenia or Schizoaffective disorder in past: No    Hallucinations:Hallucinations: None  Ideas of Reference:None  Suicidal Thoughts:Suicidal Thoughts: No  Homicidal Thoughts:Homicidal Thoughts: No   Sensorium  Memory: Immediate Poor; Recent Poor; Remote Fair  Judgment: Poor  Insight: Poor   Executive Functions  Concentration: Poor  Attention Span: Poor  Recall: Poor  Fund of Knowledge: Poor  Language: Poor   Psychomotor Activity  Psychomotor Activity: Psychomotor Activity: Restlessness   Assets  Assets: Manufacturing systems engineer; Social Support   Sleep  Sleep: Sleep: Poor Number of Hours of Sleep: 4   Nutritional Assessment (For OBS and FBC admissions only) Has the patient had a weight loss or gain of 10 pounds or more in the last 3 months?: No Has the patient had a decrease in food intake/or appetite?: No Does the patient have dental  problems?: No Does the patient have eating habits or behaviors that may be indicators of an eating disorder including binging or inducing vomiting?: No Has the patient recently lost weight without trying?: 0 Has the patient been eating poorly because of a decreased appetite?: 0 Malnutrition Screening Tool Score: 0    Physical Exam  Physical Exam HENT:     Head: Normocephalic.     Nose: Nose normal.  Eyes:     Conjunctiva/sclera: Conjunctivae normal.  Cardiovascular:     Rate and Rhythm: Normal rate.  Pulmonary:     Effort: Pulmonary effort is normal.  Musculoskeletal:        General: Normal range of motion.     Cervical back: Normal range of motion.  Neurological:     Mental Status: She is alert and oriented to person, place, and time.    Review of Systems  Constitutional: Negative.   HENT: Negative.    Eyes: Negative.   Respiratory: Negative.    Cardiovascular: Negative.   Gastrointestinal: Negative.   Genitourinary: Negative.   Musculoskeletal: Negative.   Neurological: Negative.   Endo/Heme/Allergies: Negative.   Psychiatric/Behavioral:  Positive for substance abuse.    Blood pressure 124/80, pulse 95, temperature 98.3 F (36.8 C), temperature source Oral, resp. rate 17, SpO2 100%. There is no height or weight on file to calculate BMI.  Demographic Factors:  Caucasian, Low socioeconomic status, and Unemployed  Loss Factors: Legal issues and Financial problems/change in socioeconomic status  Historical Factors: NA  Risk Reduction Factors:   Responsible for children under 47 years of age and Sense of responsibility to family  Continued Clinical Symptoms:  Alcohol/Substance Abuse/Dependencies  Cognitive Features That Contribute To Risk:  None    Suicide Risk:  Minimal: No identifiable suicidal ideation.  Patients presenting with no risk factors but with morbid ruminations; may be classified as minimal risk based on the severity of the depressive  symptoms  Plan Of Care/Follow-up recommendations:  Activity:  as tolerated  Discharge recommendations:   Outpatient Follow up: Please review list of outpatient resources for psychiatry and counseling. Please follow up with your primary care provider for all medical related needs.   You are encouraged to follow up with [facility] for outpatient treatment.  Walk in/ Open Access Hours: Monday - Friday 8AM - 11AM (To see provider and therapist) Friday - 1PM - 4PM (To see therapist only)  [facility] [address] [place], [place] [phone]  Therapy: We recommend that patient participate in individual therapy to address  mental health concerns.  Substance abuse: Please see list for resources for outpatient substance abuse treatment facilities.   Safety:   The following safety precautions should be taken:   No sharp objects. This includes scissors, razors, scrapers, and putty knives.   Chemicals should be removed and locked up.   Medications should be removed and locked up.   Weapons should be removed and locked up. This includes firearms, knives and instruments that can be used to cause injury.   The patient should abstain from use of illicit substances/drugs and abuse of any medications.  If symptoms worsen or do not continue to improve or if the patient becomes actively suicidal or homicidal then it is recommended that the patient return to the closest hospital emergency department, the [facility], or call 911 for further evaluation and treatment. National Suicide Prevention Lifeline 1-800-SUICIDE or 3478614086.  About 988 988 offers 24/7 access to trained crisis counselors who can help people experiencing mental health-related distress. People can call or text 988 or chat 988lifeline.org for themselves or if they are worried about a loved one who may need crisis support.   Disposition: discharge  home.   Rahel Carlton L, NP 10/31/2022, 10:18 AM

## 2022-10-31 NOTE — Discharge Instructions (Addendum)
Discharge recommendations:   Outpatient Follow up: Please review list of outpatient resources for psychiatry and counseling. Please follow up with your primary care provider for all medical related needs.   You are encouraged to follow up with [facility] for outpatient treatment.  Walk in/ Open Access Hours: Monday - Friday 8AM - 11AM (To see provider and therapist) Friday - 1PM - 4PM (To see therapist only)  [facility] [address] [place], [place] [phone]  Therapy: We recommend that patient participate in individual therapy to address mental health concerns.  Substance abuse: Please see list for resources for outpatient substance abuse treatment facilities.   Safety:   The following safety precautions should be taken:   No sharp objects. This includes scissors, razors, scrapers, and putty knives.   Chemicals should be removed and locked up.   Medications should be removed and locked up.   Weapons should be removed and locked up. This includes firearms, knives and instruments that can be used to cause injury.   The patient should abstain from use of illicit substances/drugs and abuse of any medications.  If symptoms worsen or do not continue to improve or if the patient becomes actively suicidal or homicidal then it is recommended that the patient return to the closest hospital emergency department, the [facility], or call 911 for further evaluation and treatment. National Suicide Prevention Lifeline 1-800-SUICIDE or 2297772830.  About 988 988 offers 24/7 access to trained crisis counselors who can help people experiencing mental health-related distress. People can call or text 988 or chat 988lifeline.org for themselves or if they are worried about a loved one who may need crisis support.

## 2022-10-31 NOTE — ED Notes (Signed)
Patient resting with eyes closed. Respirations even and unlabored. No acute distress noted. Environment secured. Will continue to monitor for safety.

## 2022-11-10 ENCOUNTER — Ambulatory Visit (HOSPITAL_COMMUNITY)
Admission: EM | Admit: 2022-11-10 | Discharge: 2022-11-11 | Disposition: A | Discharge status: Home / Self Care | Payer: No Typology Code available for payment source | Attending: Psychiatry | Admitting: Psychiatry

## 2022-11-10 DIAGNOSIS — Z9151 Personal history of suicidal behavior: Secondary | ICD-10-CM | POA: Diagnosis not present

## 2022-11-10 DIAGNOSIS — F10129 Alcohol abuse with intoxication, unspecified: Secondary | ICD-10-CM

## 2022-11-10 DIAGNOSIS — F1914 Other psychoactive substance abuse with psychoactive substance-induced mood disorder: Secondary | ICD-10-CM | POA: Insufficient documentation

## 2022-11-10 DIAGNOSIS — F1910 Other psychoactive substance abuse, uncomplicated: Secondary | ICD-10-CM | POA: Diagnosis present

## 2022-11-10 DIAGNOSIS — R519 Headache, unspecified: Secondary | ICD-10-CM | POA: Diagnosis present

## 2022-11-10 DIAGNOSIS — F1994 Other psychoactive substance use, unspecified with psychoactive substance-induced mood disorder: Secondary | ICD-10-CM | POA: Diagnosis present

## 2022-11-10 LAB — POCT URINE DRUG SCREEN - MANUAL ENTRY (I-SCREEN)
POC Amphetamine UR: POSITIVE — AB
POC Buprenorphine (BUP): NOT DETECTED
POC Cocaine UR: NOT DETECTED
POC Marijuana UR: NOT DETECTED
POC Methadone UR: NOT DETECTED
POC Methamphetamine UR: POSITIVE — AB
POC Morphine: NOT DETECTED
POC Oxazepam (BZO): NOT DETECTED
POC Oxycodone UR: NOT DETECTED
POC Secobarbital (BAR): NOT DETECTED

## 2022-11-10 LAB — CBC WITH DIFFERENTIAL/PLATELET
Abs Immature Granulocytes: 0.01 10*3/uL (ref 0.00–0.07)
Basophils Absolute: 0.1 10*3/uL (ref 0.0–0.1)
Basophils Relative: 1 %
Eosinophils Absolute: 0.2 10*3/uL (ref 0.0–0.5)
Eosinophils Relative: 2 %
HCT: 39.1 % (ref 36.0–46.0)
Hemoglobin: 13.1 g/dL (ref 12.0–15.0)
Immature Granulocytes: 0 %
Lymphocytes Relative: 40 %
Lymphs Abs: 2.7 10*3/uL (ref 0.7–4.0)
MCH: 30.8 pg (ref 26.0–34.0)
MCHC: 33.5 g/dL (ref 30.0–36.0)
MCV: 91.8 fL (ref 80.0–100.0)
Monocytes Absolute: 0.5 10*3/uL (ref 0.1–1.0)
Monocytes Relative: 7 %
Neutro Abs: 3.5 10*3/uL (ref 1.7–7.7)
Neutrophils Relative %: 50 %
Platelets: 323 10*3/uL (ref 150–400)
RBC: 4.26 MIL/uL (ref 3.87–5.11)
RDW: 12.3 % (ref 11.5–15.5)
WBC: 6.9 10*3/uL (ref 4.0–10.5)
nRBC: 0 % (ref 0.0–0.2)

## 2022-11-10 LAB — LIPID PANEL
Cholesterol: 174 mg/dL (ref 0–200)
HDL: 79 mg/dL (ref >40)
LDL Cholesterol: 87 mg/dL (ref 0–99)
Total CHOL/HDL Ratio: 2.2 ratio
Triglycerides: 38 mg/dL (ref <150)
VLDL: 8 mg/dL (ref 0–40)

## 2022-11-10 LAB — HEMOGLOBIN A1C
Hgb A1c MFr Bld: 4.6 % — ABNORMAL LOW (ref 4.8–5.6)
Mean Plasma Glucose: 85.32 mg/dL

## 2022-11-10 LAB — COMPREHENSIVE METABOLIC PANEL
ALT: 17 U/L (ref 0–44)
AST: 17 U/L (ref 15–41)
Albumin: 3.7 g/dL (ref 3.5–5.0)
Alkaline Phosphatase: 59 U/L (ref 38–126)
Anion gap: 10 (ref 5–15)
BUN: 9 mg/dL (ref 6–20)
CO2: 24 mmol/L (ref 22–32)
Calcium: 8.3 mg/dL — ABNORMAL LOW (ref 8.9–10.3)
Chloride: 104 mmol/L (ref 98–111)
Creatinine, Ser: 0.69 mg/dL (ref 0.44–1.00)
GFR, Estimated: >60 mL/min (ref >60)
Glucose, Bld: 74 mg/dL (ref 70–99)
Potassium: 3.3 mmol/L — ABNORMAL LOW (ref 3.5–5.1)
Sodium: 138 mmol/L (ref 135–145)
Total Bilirubin: 0.5 mg/dL (ref 0.3–1.2)
Total Protein: 6.6 g/dL (ref 6.5–8.1)

## 2022-11-10 LAB — TSH: TSH: 0.711 u[IU]/mL (ref 0.350–4.500)

## 2022-11-10 LAB — POC URINE PREG, ED: Preg Test, Ur: NEGATIVE

## 2022-11-10 LAB — MAGNESIUM: Magnesium: 2 mg/dL (ref 1.7–2.4)

## 2022-11-10 LAB — ETHANOL: Alcohol, Ethyl (B): <10 mg/dL (ref <10)

## 2022-11-10 MED ORDER — MAGNESIUM HYDROXIDE 400 MG/5ML PO SUSP: 30.0000 mL | Freq: Every day | ORAL | Status: DC | PRN

## 2022-11-10 MED ORDER — LOPERAMIDE HCL 2 MG PO CAPS: 2.0000 mg | ORAL_CAPSULE | ORAL | Status: DC | PRN

## 2022-11-10 MED ORDER — TRAZODONE HCL 50 MG PO TABS
50.0000 mg | ORAL_TABLET | Freq: Every evening | ORAL | Status: DC | PRN
  Administered 2022-11-10: 50 mg via ORAL
  Filled 2022-11-10: qty 1

## 2022-11-10 MED ORDER — LORAZEPAM 1 MG PO TABS: 1.0000 mg | ORAL_TABLET | Freq: Four times a day (QID) | ORAL | Status: DC | PRN

## 2022-11-10 MED ORDER — HYDROXYZINE HCL 25 MG PO TABS: 25.0000 mg | ORAL_TABLET | Freq: Four times a day (QID) | ORAL | Status: DC | PRN

## 2022-11-10 MED ORDER — ACETAMINOPHEN 325 MG PO TABS: 650.0000 mg | ORAL_TABLET | Freq: Four times a day (QID) | ORAL | Status: DC | PRN

## 2022-11-10 MED ORDER — ADULT MULTIVITAMIN W/MINERALS CH
1.0000 | ORAL_TABLET | Freq: Every day | ORAL | Status: DC
  Administered 2022-11-10 – 2022-11-11 (×2): 1 via ORAL
  Filled 2022-11-10 (×2): qty 1

## 2022-11-10 MED ORDER — ALUM & MAG HYDROXIDE-SIMETH 200-200-20 MG/5ML PO SUSP: 30.0000 mL | ORAL | Status: DC | PRN

## 2022-11-10 MED ORDER — THIAMINE HCL 100 MG/ML IJ SOLN: 100.0000 mg | Freq: Once | INTRAMUSCULAR | Status: DC

## 2022-11-10 MED ORDER — THIAMINE MONONITRATE 100 MG PO TABS
100.0000 mg | ORAL_TABLET | Freq: Every day | ORAL | Status: DC
  Administered 2022-11-11: 100 mg via ORAL
  Filled 2022-11-10: qty 1

## 2022-11-10 MED ORDER — ONDANSETRON 4 MG PO TBDP: 4.0000 mg | ORAL_TABLET | Freq: Four times a day (QID) | ORAL | Status: DC | PRN

## 2022-11-10 NOTE — ED Provider Notes (Signed)
[facility] Urgent Care Continuous Assessment Admission H&P  Date: 11/10/22 Patient Name: Alejandra Park MRN: [id number] Chief Complaint: Requesting alcohol detox and substance abuse treatment  Diagnoses:  Final diagnoses:  Alcohol abuse with intoxication [facility])    HPI: patient presented to [facility] as a walk in unaccompanied    Alejandra Park, 37 y.o., female patient seen face to face by this provider, consulted with Dr. Lucianne Muss; and chart reviewed on 11/10/22.  Patient is unable to recall if she has been diagnosed with a mental health diagnoses.  She is from the [place] area and has only been in the [place] area for a few weeks.  She reports a history of 1 suicide attempt 8 years ago via overdose on heroin.  During evaluation Alejandra Park is observed sitting in the assessment room in no acute distress.  She is disheveled and makes fleeting eye contact.  She is alert/oriented x 4 and anxious.  Her speech is clear and coherent but increased, pressured, and slurred at times.  She is irritable.  She appears to be under the influence and impaired.  She is unable to state how long she has been using alcohol or any other substance..  She has cut back on her alcohol usage and states that she only drinks a couple times a week.  However when she does drink she drinks 1/2-1/5 of liquor at a time.  Her last use was 30 minutes before presentation to facility.  She denies alcohol withdrawal seizures or delirium tremens.  She endorses a headache as her only alcohol withdrawal symptoms at this time.  She is adamant that she has not used any other substance.  However she does have a history of cocaine, heroin, and methamphetamine use. See switches quickly between being very animated to having a difficult time keeping her eyes open.  She is a poor historian and is vague when answering many questions.  She is able to answer questions appropriately.  She denies any current depression and has a bizarre affect.  She is  restless.  She denies suicidal ideation but does make the comment of, "but I will say what ever I have to to be admitted".  She denies access to firearms/weapons.  She denies homicidal/auditory visual hallucinations.  She does not appear to be responding to internal/external stimuli.  Discussed admission to the continuous assessment unit for overnight observation with a reevaluation by psychiatry in the a.m.  Lab work including UDS ordered.  Patient could be a candidate for FBC.  She is interested in residential substance abuse treatment.  If she cannot be admitted to the [facility] she would be interested in a women's shelter or possible assistance with residential treatment.  Total Time spent with patient: 30 minutes  Musculoskeletal  Strength & Muscle Tone: within normal limits Gait & Station: normal Patient leans: N/A  Psychiatric Specialty Exam  Presentation General Appearance:  Disheveled  Eye Contact: Fleeting  Speech: Clear and Coherent; Normal Rate; Pressured  Speech Volume: Decreased  Handedness: Right   Mood and Affect  Mood: Anxious; Labile; Irritable  Affect: Congruent   Thought Process  Thought Processes: Coherent  Descriptions of Associations:Intact  Orientation:Full (Time, Place and Person)  Thought Content:Logical  Diagnosis of Schizophrenia or Schizoaffective disorder in past: No   Hallucinations:Hallucinations: None  Ideas of Reference:None  Suicidal Thoughts:Suicidal Thoughts: No  Homicidal Thoughts:Homicidal Thoughts: No   Sensorium  Memory: Immediate Fair; Recent Fair; Remote Fair  Judgment: Poor  Insight: Poor   Executive  Functions  Concentration: Fair  Attention Span: Fair  Recall: Poor  Fund of Knowledge: Fair  Language: Fair   Psychomotor Activity  Psychomotor Activity:Psychomotor Activity: Restlessness   Assets  Assets: Physical Health; Resilience; Leisure Time   Sleep  Sleep:Sleep:  Poor   Nutritional Assessment (For OBS and FBC admissions only) Has the patient had a weight loss or gain of 10 pounds or more in the last 3 months?: No Has the patient had a decrease in food intake/or appetite?: No Does the patient have dental problems?: No Does the patient have eating habits or behaviors that may be indicators of an eating disorder including binging or inducing vomiting?: No Has the patient recently lost weight without trying?: 0 Has the patient been eating poorly because of a decreased appetite?: 0 Malnutrition Screening Tool Score: 0    Physical Exam Vitals and nursing note reviewed.  Constitutional:      General: She is not in acute distress.    Appearance: Normal appearance. She is not ill-appearing.  Eyes:     General:        Right eye: No discharge.        Left eye: No discharge.  Cardiovascular:     Rate and Rhythm: Normal rate.  Pulmonary:     Effort: Pulmonary effort is normal.  Musculoskeletal:        General: Normal range of motion.     Cervical back: Normal range of motion.  Skin:    Coloration: Skin is not jaundiced or pale.  Neurological:     Mental Status: She is alert and oriented to person, place, and time.  Psychiatric:        Attention and Perception: She is inattentive.        Mood and Affect: Mood is anxious. Affect is labile.        Speech: Speech is rapid and pressured.        Behavior: Behavior is agitated. Behavior is cooperative.        Thought Content: Thought content normal.        Cognition and Memory: Cognition normal.        Judgment: Judgment is impulsive.    Review of Systems  Constitutional: Negative.   HENT: Negative.    Eyes: Negative.   Respiratory: Negative.    Cardiovascular: Negative.   Musculoskeletal: Negative.   Skin: Negative.   Neurological: Negative.   Psychiatric/Behavioral:  Positive for substance abuse. The patient is nervous/anxious.     Blood pressure 107/65, pulse (!) 112, temperature 98 F  (36.7 C), temperature source Oral, resp. rate 18, SpO2 100%. There is no height or weight on file to calculate BMI.  Past Psychiatric History: No significant past psychiatric history other than polysubstance abuse, reported history of heroin, cocaine, and methamphetamine use.  Is the patient at risk to self? No  Has the patient been a risk to self in the past 6 months? No .    Has the patient been a risk to self within the distant past? No   Is the patient a risk to others? No   Has the patient been a risk to others in the past 6 months? No   Has the patient been a risk to others within the distant past? No   Past Medical History:  Past Medical History:  Diagnosis Date   Acute renal failure (ARF) (HCC) 01/24/2016   Due to rhabdomyolysis, required HD for 10 days then fully recovered   History of abnormal  cervical Pap smear 03/20/2016   Moderate opioid use disorder [facility])    Rhabdomyolysis 01/24/2016   Traumatic rhabdo following opioid overdose     Family History: No known history  Social History: Patient is homeless, unemployed and has 3 children (19, 15, 11) 0 not in her custody.  Patient reports she had court dates in [place] but states she was forced to come to the [place] area.  She has only been in this area for a few weeks.  Last Labs:  Admission on 11/10/2022  Component Date Value Ref Range Status   WBC 11/10/2022 6.9  4.0 - 10.5 K/uL Final   RBC 11/10/2022 4.26  3.87 - 5.11 MIL/uL Final   Hemoglobin 11/10/2022 13.1  12.0 - 15.0 g/dL Final   HCT 13/24/4010 39.1  36.0 - 46.0 % Final   MCV 11/10/2022 91.8  80.0 - 100.0 fL Final   MCH 11/10/2022 30.8  26.0 - 34.0 pg Final   MCHC 11/10/2022 33.5  30.0 - 36.0 g/dL Final   RDW 27/25/3664 12.3  11.5 - 15.5 % Final   Platelets 11/10/2022 323  150 - 400 K/uL Final   nRBC 11/10/2022 0.0  0.0 - 0.2 % Final   Neutrophils Relative % 11/10/2022 50  % Final   Neutro Abs 11/10/2022 3.5  1.7 - 7.7 K/uL Final   Lymphocytes Relative  11/10/2022 40  % Final   Lymphs Abs 11/10/2022 2.7  0.7 - 4.0 K/uL Final   Monocytes Relative 11/10/2022 7  % Final   Monocytes Absolute 11/10/2022 0.5  0.1 - 1.0 K/uL Final   Eosinophils Relative 11/10/2022 2  % Final   Eosinophils Absolute 11/10/2022 0.2  0.0 - 0.5 K/uL Final   Basophils Relative 11/10/2022 1  % Final   Basophils Absolute 11/10/2022 0.1  0.0 - 0.1 K/uL Final   Immature Granulocytes 11/10/2022 0  % Final   Abs Immature Granulocytes 11/10/2022 0.01  0.00 - 0.07 K/uL Final   Performed at [facility] Lab, [address], [place], [place] [address]   Sodium 11/10/2022 138  135 - 145 mmol/L Final   Potassium 11/10/2022 3.3 (L)  3.5 - 5.1 mmol/L Final   Chloride 11/10/2022 104  98 - 111 mmol/L Final   CO2 11/10/2022 24  22 - 32 mmol/L Final   Glucose, Bld 11/10/2022 74  70 - 99 mg/dL Final   Glucose reference range applies only to samples taken after fasting for at least 8 hours.   BUN 11/10/2022 9  6 - 20 mg/dL Final   Creatinine, Ser 11/10/2022 0.69  0.44 - 1.00 mg/dL Final   Calcium 42/59/5638 8.3 (L)  8.9 - 10.3 mg/dL Final   Total Protein 75/64/3329 6.6  6.5 - 8.1 g/dL Final   Albumin 51/88/4166 3.7  3.5 - 5.0 g/dL Final   AST 08/23/1599 17  15 - 41 U/L Final   ALT 11/10/2022 17  0 - 44 U/L Final   Alkaline Phosphatase 11/10/2022 59  38 - 126 U/L Final   Total Bilirubin 11/10/2022 0.5  0.3 - 1.2 mg/dL Final   GFR, Estimated 11/10/2022 >60  >60 mL/min Final   Comment: (NOTE) Calculated using the CKD-EPI Creatinine Equation (2021)    Anion gap 11/10/2022 10  5 - 15 Final   Performed at [facility], [address] [place], [place] [address]   Hgb A1c MFr Bld 11/10/2022 4.6 (L)  4.8 - 5.6 % Final   Comment: (NOTE) Pre diabetes:  5.7%-6.4%  Diabetes:              >6.4%  Glycemic control for   <7.0% adults with diabetes    Mean Plasma Glucose 11/10/2022 85.32  mg/dL Final   Performed at [facility], [address] [place], [place]  [address]   Magnesium 11/10/2022 2.0  1.7 - 2.4 mg/dL Final   Performed at [facility], [address], [place], [place] [address]   Alcohol, Ethyl (B) 11/10/2022 <10  <10 mg/dL Final   Comment: (NOTE) Lowest detectable limit for serum alcohol is 10 mg/dL.  For medical purposes only. Performed at [facility], [address], [place], [place] [address]    Cholesterol 11/10/2022 174  0 - 200 mg/dL Final   Triglycerides 91/47/8295 38  <150 mg/dL Final   HDL 62/13/0865 79  >40 mg/dL Final   Total CHOL/HDL Ratio 11/10/2022 2.2  RATIO Final   VLDL 11/10/2022 8  0 - 40 mg/dL Final   LDL Cholesterol 11/10/2022 87  0 - 99 mg/dL Final   Comment:        Total Cholesterol/HDL:CHD Risk Coronary Heart Disease Risk Table                     Men   Women  1/2 Average Risk   3.4   3.3  Average Risk       5.0   4.4  2 X Average Risk   9.6   7.1  3 X Average Risk  23.4   11.0        Use the calculated Patient Ratio above and the CHD Risk Table to determine the patient's CHD Risk.        ATP III CLASSIFICATION (LDL):  <100     mg/dL   Optimal  784-696  mg/dL   Near or Above                    Optimal  130-159  mg/dL   Borderline  295-284  mg/dL   High  >132     mg/dL   Very High Performed at [facility], [address] [place], [place] [address]    TSH 11/10/2022 0.711  0.350 - 4.500 uIU/mL Final   Comment: Performed by a 3rd Generation assay with a functional sensitivity of <=0.01 uIU/mL. Performed at [facility], [address], [place], [place] [address]   Admission on 10/30/2022, Discharged on 10/31/2022  Component Date Value Ref Range Status   WBC 10/31/2022 8.4  4.0 - 10.5 K/uL Final   RBC 10/31/2022 4.50  3.87 - 5.11 MIL/uL Final   Hemoglobin 10/31/2022 13.7  12.0 - 15.0 g/dL Final   HCT 66/44/0347 42.2  36.0 - 46.0 % Final   MCV 10/31/2022 93.8  80.0 - 100.0 fL Final   MCH 10/31/2022 30.4  26.0 - 34.0 pg Final   MCHC 10/31/2022 32.5  30.0 - 36.0 g/dL  Final   RDW 42/59/5638 12.6  11.5 - 15.5 % Final   Platelets 10/31/2022 334  150 - 400 K/uL Final   nRBC 10/31/2022 0.0  0.0 - 0.2 % Final   Neutrophils Relative % 10/31/2022 62  % Final   Neutro Abs 10/31/2022 5.3  1.7 - 7.7 K/uL Final   Lymphocytes Relative 10/31/2022 28  % Final   Lymphs Abs 10/31/2022 2.3  0.7 - 4.0 K/uL Final   Monocytes Relative 10/31/2022 7  %  Final   Monocytes Absolute 10/31/2022 0.6  0.1 - 1.0 K/uL Final   Eosinophils Relative 10/31/2022 2  % Final   Eosinophils Absolute 10/31/2022 0.2  0.0 - 0.5 K/uL Final   Basophils Relative 10/31/2022 1  % Final   Basophils Absolute 10/31/2022 0.0  0.0 - 0.1 K/uL Final   Immature Granulocytes 10/31/2022 0  % Final   Abs Immature Granulocytes 10/31/2022 0.02  0.00 - 0.07 K/uL Final   Performed at [facility], [address], [place], [place] [address]   Sodium 10/31/2022 139  135 - 145 mmol/L Final   Potassium 10/31/2022 4.3  3.5 - 5.1 mmol/L Final   Chloride 10/31/2022 101  98 - 111 mmol/L Final   CO2 10/31/2022 25  22 - 32 mmol/L Final   Glucose, Bld 10/31/2022 89  70 - 99 mg/dL Final   Glucose reference range applies only to samples taken after fasting for at least 8 hours.   BUN 10/31/2022 12  6 - 20 mg/dL Final   Creatinine, Ser 10/31/2022 0.62  0.44 - 1.00 mg/dL Final   Calcium 60/45/4098 9.2  8.9 - 10.3 mg/dL Final   Total Protein 11/91/4782 6.5  6.5 - 8.1 g/dL Final   Albumin 95/62/1308 3.5  3.5 - 5.0 g/dL Final   AST 65/78/4696 17  15 - 41 U/L Final   ALT 10/31/2022 18  0 - 44 U/L Final   Alkaline Phosphatase 10/31/2022 60  38 - 126 U/L Final   Total Bilirubin 10/31/2022 0.4  0.3 - 1.2 mg/dL Final   GFR, Estimated 10/31/2022 >60  >60 mL/min Final   Comment: (NOTE) Calculated using the CKD-EPI Creatinine Equation (2021)    Anion gap 10/31/2022 13  5 - 15 Final   Performed at [facility], [address] [place], [place] [address]   Alcohol, Ethyl (B) 10/31/2022 <10  <10 mg/dL Final   Comment:  (NOTE) Lowest detectable limit for serum alcohol is 10 mg/dL.  For medical purposes only. Performed at [facility], [address], [place], [place] [address]    POC Amphetamine UR 10/31/2022 None Detected  NONE DETECTED (Cut Off Level 1000 ng/mL) Final   POC Secobarbital (BAR) 10/31/2022 None Detected  NONE DETECTED (Cut Off Level 300 ng/mL) Final   POC Buprenorphine (BUP) 10/31/2022 None Detected  NONE DETECTED (Cut Off Level 10 ng/mL) Final   POC Oxazepam (BZO) 10/31/2022 None Detected  NONE DETECTED (Cut Off Level 300 ng/mL) Final   POC Cocaine UR 10/31/2022 None Detected  NONE DETECTED (Cut Off Level 300 ng/mL) Final   POC Methamphetamine UR 10/31/2022 Positive (A)  NONE DETECTED (Cut Off Level 1000 ng/mL) Final   POC Morphine 10/31/2022 None Detected  NONE DETECTED (Cut Off Level 300 ng/mL) Final   POC Methadone UR 10/31/2022 None Detected  NONE DETECTED (Cut Off Level 300 ng/mL) Final   POC Oxycodone UR 10/31/2022 None Detected  NONE DETECTED (Cut Off Level 100 ng/mL) Final   POC Marijuana UR 10/31/2022 None Detected  NONE DETECTED (Cut Off Level 50 ng/mL) Final   Preg Test, Ur 10/31/2022 Negative  Negative Final    Allergies: Patient has no known allergies.  Medications:  Facility Ordered Medications  Medication   acetaminophen (TYLENOL) tablet 650 mg   alum & mag hydroxide-simeth (MAALOX/MYLANTA) 200-200-20 MG/5ML suspension 30 mL   magnesium hydroxide (MILK OF MAGNESIA) suspension 30 mL   traZODone (DESYREL) tablet 50 mg   thiamine (VITAMIN B1) injection 100 mg   [  START ON 11/11/2022] thiamine (VITAMIN B1) tablet 100 mg   multivitamin with minerals tablet 1 tablet   LORazepam (ATIVAN) tablet 1 mg   hydrOXYzine (ATARAX) tablet 25 mg   loperamide (IMODIUM) capsule 2-4 mg   ondansetron (ZOFRAN-ODT) disintegrating tablet 4 mg      Medical Decision Making  Patient presents to [facility] report requesting substance abuse treatment.  She is impaired at this time.   Reports she has used alcohol but per chart review she does have a history of heroin, cocaine, and methamphetamines.  She will be admitted to the continuous assessment unit for overnight observation.  She will be reevaluated by psychiatry in the a.m.  She could possibly be a candidate for FBC.    Recommendations  Based on my evaluation the patient does not appear to have an emergency medical condition.  Admit patient to the continuous assessment unit for overnight observation.  Patient could possibly be a good candidate for FBC.  She is also interested in residential substance abuse treatment.  If patient is to be discharged she is interested in women shelters.  CIWA protocol with Ativan 1 mg every 6 hours as needed for CIWA greater than 10. Considered COWS protocol-however patient is very adamant that she has not used any form of opioid.  Lab Orders         CBC with Differential/Platelet         Comprehensive metabolic panel         Hemoglobin A1c         Magnesium         Ethanol         Lipid panel         TSH         RPR         POC urine preg, ED         POCT Urine Drug Screen - (I-Screen)     EKG   Ardis Hughs, NP 11/10/22  8:47 PM

## 2022-11-10 NOTE — Progress Notes (Signed)
   11/10/22 1726  [facility] Triage Screening (Walk-ins at [facility] only)  How Did You Hear About [place]? Self  What Is the Reason for Your Visit/Call Today? Alejandra Park is a 37 year old female presenting as a voluntary walk-in to [facility]. Pt states she is seeking alcohol detox. Pt appears to be under the influence as evidenced by agitation, unable to maintain eye contact, mumbled speech. Pt is currently homeless. When asked if the pt was experiencing SI, she replies "I'll be whatever I need to be to get in here". Pt walked outside, stating she wanted to gather her belongings before coming in to complete triage. Pt states "I am going to take my time", and states we will see her on her time when she gets ready.Pt reports drinking alcohol 20 minutes ago.Patient denied SI, HI and psychosis.  How Long Has This Been Causing You Problems? > than 6 months  Have You Recently Had Any Thoughts About Hurting Yourself? No  Are You Planning to Commit Suicide/Harm Yourself At This time? No  Have you Recently Had Thoughts About Hurting Someone [name]? No  Are You Planning To Harm Someone At This Time? No  Are you currently experiencing any auditory, visual or other hallucinations? No  Have You Used Any Alcohol or Drugs in the Past 24 Hours? Yes  How long ago did you use Drugs or Alcohol? 20 minutes ago  What Did You Use and How Much? reports drinking all day today 1/2 a fifth of liquor.  Do you have any current medical co-morbidities that require immediate attention? No  Clinician description of patient physical appearance/behavior: appears to be under the influence  What Do You Feel Would Help You the Most Today? Alcohol or Drug Use Treatment  If access to [facility] was not available, would you have sought care in the Emergency Department? No  Determination of Need Urgent (48 hours)  Options For Referral [facility]

## 2022-11-10 NOTE — ED Notes (Signed)
Patient observed/assessed in bed/chair resting quietly appearing in no distress and verbalizing no complaints at this time. Will continue to monitor.  

## 2022-11-11 ENCOUNTER — Encounter (HOSPITAL_COMMUNITY): Payer: Self-pay | Admitting: Registered Nurse

## 2022-11-11 DIAGNOSIS — F1914 Other psychoactive substance abuse with psychoactive substance-induced mood disorder: Secondary | ICD-10-CM | POA: Diagnosis not present

## 2022-11-11 DIAGNOSIS — F1910 Other psychoactive substance abuse, uncomplicated: Secondary | ICD-10-CM | POA: Diagnosis present

## 2022-11-11 DIAGNOSIS — F10129 Alcohol abuse with intoxication, unspecified: Secondary | ICD-10-CM | POA: Diagnosis not present

## 2022-11-11 NOTE — ED Provider Notes (Signed)
FBC/OBS ASAP Discharge Summary  Date and Time: 11/11/2022 12:01 PM  Name: Alejandra Park  MRN:  [id number]   Discharge Diagnoses:  Final diagnoses:  Alcohol abuse with intoxication (HCC)  Polysubstance abuse (HCC)  Substance induced mood disorder [facility])     Stay Summary: AMIYRA BATCHELOR, 37 y.o., female patient presented to [facility] on 11/10/22 and admitted to continuous assessment for safety and stabilization after presenting with complaints of intoxication and alcohol abuse Patient reassessed face-to-face by this provider, chart reviewed, and consulted with Dr. Nelly Rout on 11/11/22 On evaluation, Alejandra Park is lying in bed with covers pulled over her head.  Her name is called several times and she is asked to sit up to participate in assessment.  Patient states "Do ya'll always come around this early in the morning to talk to somebody."  Patient was encouraged to participate in assessment so her needs could be determined and to decide if she needed psychiatric assistance or substance abuse assistance.  Patient states that she drinks alcohol about 2-3 times a week but is unsure how much she drinks, when asked about other drug use she states "I don't know."  Patient denies suicidal/self-harm/homicidal ideation, psychosis, and paranoia.  She states she wants assistance with her alcohol problem or "homelessness."  Patient informed that there was a bed available in [facility] and asked if she wanted to go.  At first wouldn't answer so this provider gave her time to think about it.  Once provider went back patient states she is not interested.  Patient is alert, oriented x 4, with not noted distress.  Her speech is clear, coherent, at normal rate and volume.  Her mood is nonchalant like she doesn't care only wanting to sleep.  Her participation in assessment is liner.  She denies suicidal/self-harm/homicidal ideation, psychosis, and paranoia.    Objectively there is no evidence of psychosis/mania or  delusional thinking.  Her responses to assessment questions were liner but relevant and appropriate.  She conversed coherently, with no distractibility, or pre-occupation.  At this time patient is educated and verbalizes understanding of mental health resources and other crisis services in the community. She is instructed to call 911 and present to the nearest emergency room should she experience any suicidal/homicidal ideation, auditory/visual/hallucinations, or detrimental worsening of her mental health condition.  Patient also instructed that she could call the toll-free phone number on back of insurance card/Medicaid card to get assist with identifying counselors and agencies in network or [place] card to speak with a Medicaid care coordinator.  Patient is given resources for community, psychiatric, and substance abuse services.      Total Time spent with patient: 45 minutes  Past Psychiatric History:  Patient Active Problem List   Diagnosis Date Noted   Polysubstance abuse (HCC) 11/11/2022   URI (upper respiratory infection) 01/20/2017   Adjustment disorder 11/26/2016   Health care maintenance 03/20/2016   Hepatitis C 01/29/2016   Substance induced mood disorder (HCC) 06/09/2011   Opioid use disorder, moderate (HCC) 06/05/2011    Past Medical History:  Past Medical History:  Diagnosis Date   Acute renal failure (ARF) (HCC) 01/24/2016   Due to rhabdomyolysis, required HD for 10 days then fully recovered   History of abnormal cervical Pap smear 03/20/2016   Moderate opioid use disorder (HCC)    Rhabdomyolysis 01/24/2016   Traumatic rhabdo following opioid overdose    Family History:  Family History  Problem Relation Age of Onset   Healthy  Mother    Healthy Father     Family Psychiatric History: None reported Social History: Homeless unemployed Social History   Tobacco Use   Smoking status: Every Day    Current packs/day: 0.50    Types: Cigarettes   Smokeless tobacco:  Current  Substance Use Topics   Alcohol use: Yes   Drug use: Yes    Comment: Percocet and Heroin    Tobacco Cessation:  A prescription for an FDA-approved tobacco cessation medication was offered at discharge and the patient refused  Current Medications:  Current Facility-Administered Medications  Medication Dose Route Frequency Provider Last Rate Last Admin   acetaminophen (TYLENOL) tablet 650 mg  650 mg Oral Q6H PRN Ardis Hughs, NP       alum & mag hydroxide-simeth (MAALOX/MYLANTA) 200-200-20 MG/5ML suspension 30 mL  30 mL Oral Q4H PRN Ardis Hughs, NP       hydrOXYzine (ATARAX) tablet 25 mg  25 mg Oral Q6H PRN Ardis Hughs, NP       loperamide (IMODIUM) capsule 2-4 mg  2-4 mg Oral PRN Ardis Hughs, NP       LORazepam (ATIVAN) tablet 1 mg  1 mg Oral Q6H PRN Ardis Hughs, NP       magnesium hydroxide (MILK OF MAGNESIA) suspension 30 mL  30 mL Oral Daily PRN Ardis Hughs, NP       multivitamin with minerals tablet 1 tablet  1 tablet Oral Daily Ardis Hughs, NP   1 tablet at 11/11/22 1024   ondansetron (ZOFRAN-ODT) disintegrating tablet 4 mg  4 mg Oral Q6H PRN Ardis Hughs, NP       thiamine (VITAMIN B1) injection 100 mg  100 mg Intramuscular Once Ardis Hughs, NP       thiamine (VITAMIN B1) tablet 100 mg  100 mg Oral Daily Vernard Gambles H, NP   100 mg at 11/11/22 1024   traZODone (DESYREL) tablet 50 mg  50 mg Oral QHS PRN Ardis Hughs, NP   50 mg at 11/10/22 2203   No current outpatient medications on file.    PTA Medications:  Facility Ordered Medications  Medication   acetaminophen (TYLENOL) tablet 650 mg   alum & mag hydroxide-simeth (MAALOX/MYLANTA) 200-200-20 MG/5ML suspension 30 mL   magnesium hydroxide (MILK OF MAGNESIA) suspension 30 mL   traZODone (DESYREL) tablet 50 mg   thiamine (VITAMIN B1) injection 100 mg   thiamine (VITAMIN B1) tablet 100 mg   multivitamin with minerals tablet 1 tablet   LORazepam  (ATIVAN) tablet 1 mg   hydrOXYzine (ATARAX) tablet 25 mg   loperamide (IMODIUM) capsule 2-4 mg   ondansetron (ZOFRAN-ODT) disintegrating tablet 4 mg       11/26/2016    2:53 PM 10/27/2016    9:44 AM 09/01/2016    9:16 AM  Depression screen PHQ 2/9  Decreased Interest 0 0 0  Down, Depressed, Hopeless 0 0 0  PHQ - 2 Score 0 0 0  Altered sleeping   0  Tired, decreased energy   0  Change in appetite   0  Feeling bad or failure about yourself    0  Trouble concentrating   0  Moving slowly or fidgety/restless   0  Suicidal thoughts   0  PHQ-9 Score   0    Flowsheet Row ED from 11/10/2022 in [facility] ED from 10/30/2022 in [facility]  C-SSRS RISK CATEGORY No  Risk No Risk       Musculoskeletal  Strength & Muscle Tone: within normal limits Gait & Station: normal Patient leans: N/A  Psychiatric Specialty Exam  Presentation  General Appearance:  Appropriate for Environment  Eye Contact: None  Speech: Clear and Coherent; Normal Rate  Speech Volume: Normal  Handedness: Right   Mood and Affect  Mood: Irritable  Affect: Appropriate   Thought Process  Thought Processes: Coherent; Linear  Descriptions of Associations:Intact  Orientation:Full (Time, Place and Person)  Thought Content:Logical  Diagnosis of Schizophrenia or Schizoaffective disorder in past: No    Hallucinations:Hallucinations: None  Ideas of Reference:None  Suicidal Thoughts:Suicidal Thoughts: No  Homicidal Thoughts:Homicidal Thoughts: No   Sensorium  Memory: Immediate Good; Recent Good  Judgment: Intact  Insight: Shallow; Present   Executive Functions  Concentration: Good  Attention Span: Good  Recall: Good  Fund of Knowledge: Good  Language: Good   Psychomotor Activity  Psychomotor Activity: Psychomotor Activity: Normal   Assets  Assets: Communication Skills; Physical Health   Sleep   Sleep: Sleep: Good   Nutritional Assessment (For OBS and FBC admissions only) Has the patient had a weight loss or gain of 10 pounds or more in the last 3 months?: No Has the patient had a decrease in food intake/or appetite?: No Does the patient have dental problems?: No Does the patient have eating habits or behaviors that may be indicators of an eating disorder including binging or inducing vomiting?: No Has the patient recently lost weight without trying?: 0 Has the patient been eating poorly because of a decreased appetite?: 0 Malnutrition Screening Tool Score: 0    Physical Exam  Physical Exam Vitals and nursing note reviewed. Exam conducted with a chaperone present.  Constitutional:      General: She is not in acute distress.    Appearance: Normal appearance. She is not ill-appearing.  HENT:     Head: Normocephalic.  Cardiovascular:     Rate and Rhythm: Normal rate.  Pulmonary:     Effort: Pulmonary effort is normal. No respiratory distress.  Musculoskeletal:        General: Normal range of motion.     Cervical back: Normal range of motion.  Skin:    General: Skin is warm and dry.  Neurological:     Mental Status: She is alert and oriented to person, place, and time.  Psychiatric:        Attention and Perception: Attention and perception normal. She does not perceive auditory or visual hallucinations.        Mood and Affect: Mood and affect normal.        Speech: Speech normal.        Behavior: Behavior normal.        Thought Content: Thought content normal. Thought content is not paranoid or delusional. Thought content does not include homicidal or suicidal ideation.        Cognition and Memory: Cognition normal.        Judgment: Judgment is impulsive.    Review of Systems  Constitutional:        No other complaints voiced  Psychiatric/Behavioral:  Positive for substance abuse (Alcohol). Negative for suicidal ideas (Denies). Depression: dysphoric.  Hallucinations: Denies.Nervous/anxious: Denies.   All other systems reviewed and are negative.  Blood pressure 102/64, pulse 100, temperature 98.3 F (36.8 C), temperature source Oral, resp. rate 18, SpO2 100%. There is no height or weight on file to calculate BMI.  Demographic Factors:  Caucasian and  Female  Loss Factors: NA  Historical Factors: Impulsivity  Risk Reduction Factors:   Religious beliefs about death  Continued Clinical Symptoms:  Alcohol/Substance Abuse/Dependencies  Cognitive Features That Contribute To Risk:  None    Suicide Risk:  Minimal: No identifiable suicidal ideation.  Patients presenting with no risk factors but with morbid ruminations; may be classified as minimal risk based on the severity of the depressive symptoms  Plan Of Care/Follow-up recommendations:  Other:  Follow up with resources given  Disposition: No evidence of imminent risk to self or others at present.   Patient does not meet criteria for psychiatric inpatient admission. Supportive therapy provided about ongoing stressors. Discussed crisis plan, support from social network, calling 911, coming to the Emergency Department, and calling Suicide Hotline.  Aja Whitehair, NP 11/11/2022, 12:01 PM

## 2022-11-11 NOTE — Discharge Instructions (Signed)
[facility]: Outpatient psychiatric Services:   Please see the walk in hours listed below.  Medication Management New Patient needing Medication Management Walk-in, and Existing Patients needing to see a provider for management coming as a walk in   Monday thru Friday 8:00 AM first come first serve until slots are full.  Recommend being there by 7:15 AM to ensure a slot is open.  Therapy New Patient Therapy Intake and Existing Patients needing to see therapist coming in as a walk in.   Monday, Wednesday, and Thursday morning at 8:00 am first come first serve.  Recommend being there by 7:15 AM to ensure a slot is open.    Every 1st, 2nd, and 3rd Friday at 1:00 PM first come first serve until slots are full.  Will still need to come in that morning at 7:15 AM to get registered for an afternoon slot.  For all walk-ins we ask that you arrive by 7:15 am because patients will be seen in there order of arrival (FIRST COME FIRST SERVE) Availability is limited, therefore you may not be seen on the same day that you walk in if all slots are full.    Our goal is to serve and meet the needs of our community to the best of our ability.   SUBSTANCE USE TREATMENT for Medicaid/Medicare and State Funded/IPRS  [facility] [address], [place], [address] [phone] phone NOTE: [facility] is no longer offering IOP services.  Serves those who are low-income or have no insurance.  [facility] [address] [place], [place], [address] [phone] phone [phone] fax NOTE: Does have Substance Abuse-Intensive Outpatient Program [facility]) as well as transitional housing if eligible.  [facility] [address] [place], [place], [address] [phone] phone [phone] fax  [facility] [phone] [place], [place], [address] [phone] phone [phone] fax  Long Term (greater than 30 days) Residential  Treatment Centers in [facility]  [place],?WF?[id number]   Phone: [phone] [facility] is a premier accredited?residential?mental health treatment facility for adults in [place], [place]. We offer a continuum of care that includes PHP and IOP in addition to?residential?services. Our holistic behavioral health home model focuses on complete psychiatric, medical and spiritual wellness with access to full psychiatric diagnostic and assessment services and psychological testing. In addition to traditional and group therapy (CBT based), we also offer integrative therapies including art, music, recreation, Clinical research associate, health & wellness, pet and pastoral care.  [facility]  [place],?YH?[address]   Phone: [phone] [facility] is a clinically dynamic, dual-diagnosis?residential?treatment program for adolescent males ages 60-17. Through a holistic approach, we help emerging young men address trauma, substance use, mental health issues, grief and loss, and adoption/divorce issues. Equine therapy, physical fitness, nutritional education, adventure and experiential therapies, and animal husbandry complement clinically intensive programming. An academic component is also included, giving clients the opportunity to stay on track with their schooling while enrolled at [facility]. To provide healing and growth for the entire family system, we incorporate an emotionally focused Family Therapy Program centered around improving parent-child relationships and communication skills for sustained recovery. [facility] also offers Recovery Management services based on the Gorski-CENAPS model of relapse prevention for clients who have experienced or are about to experience a return to use after roughly 90 days of sobriety.   [facility] [employer] and Adult [employer],?YW?[id number]  Phone: [phone] [facility] is a premier addiction rehabilitation center located in the [address] town of [place], [place]. We accept patients ages 59 and older who are struggling with symptoms of substance use disorders and certain co-occurring mental health concerns. We are dedicated to using an individualized approach to treatment and customize each patient's treatment plan according to their unique needs and goals. We offer several programming options to meet patients where they are on the journey to healing, including a partial hospitalization program (PHP) that supports patients who need structure without 24-hour supervision. Though many of our PHP patients begin in our detox or?residential?program, we have some patients who begin treatment at the [facility] level. This program typically lasts up to 28 days; however, the actual duration will depend on the personal needs of the patient.  [facility] [employer] and [employer],?ZH?[address]   Phone [phone]  By exploring foundational issues that precipitate substance use and other disorders, our programs give clients the resources to be successful in?long-term?recovery, become more self-sufficient, identify triggers that can lead to relapse, build healthy relationships, and learn positive coping skills.  [facility] has three clinically driven, trauma-focused programs that offer gender-specific care to young adult men ages 24-30 ([place]), young adult women ages 37-35 ([place]), and adolescent boys ages 80-17 ([place]). Our master's level clinicians, dually licensed to treat mental health and addiction, integrate research-supported practices with complementary modalities to help clients honor themselves, recognize self-worth, and pursue positive, lasting change. [facility] and [employer] generally provide primary substance use treatment, while the Edwena Felty can also provide treatment for primary mental health.  Our developmentally specific treatment methods consider clients' unique stories, trauma history, gender challenges, substance use history, relapse triggers, and mental health issues to allow for holistic treatment of the whole person. [facility] also offers Recovery Management services based on the Gorski-CENAPS model of relapse prevention for clients who have experienced or are about to experience a return to use after roughly 90 days of sobriety.   [facility] Treatment Center  [place],?[id number]   Phone: [phone]  Omer Jack is a recognized leader for its effectiveness in twelve-step?residential?treatment, research-based recovery services and education for substance use and co-occurring disorders. Through an exceptional staff of clinical and medical professionals, we provide intensive?residential?and outpatient treatment for adults and young adults, extended care for professionals and women with trauma and persistent relapse issues, alumni support and family education services for adult men, women and children. Omer Jack is a Dentist and is fully accredited by [employer] on Optician, dispensing, [employer]. [facility],?[id number]   Phone: [phone] We offer struggling individuals a chance at freedom from addiction and alcoholism through comprehensive treatment. If you need help, recovery is possible. At [facility], every individual is met with the compassion and the care that they need in order to truly heal. Our physicians and psychologists work hand in hand to come up with personalized treatment strategies which are meant to address individualized needs. These strategies are available in both outpatient and?residential?aspects, depending on an individual's history of addiction and day-to-day responsibilities. Recovery from addiction is challenging. With the help and support from compassionate case members,  therapists, and staff within the recovery community here at [facility], individuals struggling with addiction have a chance at a new beginning.   [facility] - Adult Residential  [place],?PP?[address]   Phone: [phone], located in [place], [place], provides?residential?eating  disorder treatment for people who are struggling with anorexia, bulimia, and other eating disorders. We also offer comprehensive treatment for people who have co-occurring mental health concerns. We provide care at two locations: Our first facility is designed for women ages 45 and older, and our second facility serves adults ages 47 and older of all genders. The goal of both?residential?treatment programs is to help people find healing and lasting freedom from the burdens of eating disorders. The average length of stay at this level of care is 30-60 days, but each person's actual length of stay depends on their unique needs. Treatment at our facility incorporates a number of therapies, including dialectical behavior therapy (DBT), exposure and response prevention (ERP), motivational interviewing (MI), and cognitive behavioral therapy (CBT). While in treatment, clients can also benefit from medical care, medication management services, and individual, group, family, and experiential therapies.   [facility]:  Treatment Center, MS, [facility], LCAS, CCS  [place],?VO?[address]   Phone: [phone] [facility] provides more than just transitional living homes. We are a supportive community of peers and addictions professionals committed to substance abuse recovery. Our safe, close-knit community and highly structured programs ensure our residents have the best support possible for a successful and?long-term?recovery. Now offering an Intensive Outpatient (IOP) program available to the [place] community. [facility] also is a MAT friendly program and supports men across mental  health and substance use disorders.   [facility]  [place],?[id number]   Phone: [phone] Our healing mission is to improve the lives of individuals impeded by mental health challenges or mental illness to achieve their highest levels of functioning and fulfillment. [facility]' progressive?residential?healing communities and transitional living program are located in the [address] of [place] in [place] and [place]. We offer hope and healing to adults who struggle with mental health challenges. Our approach is not based on "quick fixes" but grounded in research and the proven healing power of community in a?residential?setting. Our philosophy: mind and heart working together. Simply put, we believe that whole person care, delivered in a community setting is the most powerful and effective way to address challenging mental health issues. With an integrated system of care, [facility] offers six levels of treatment, The [facility] at [facility] (residential?treatment), [facility] at [employer] (residential?treatment), Extended Care at the [facility] (semi-transitional), The [employer] (transitional living with two levels of support), and a Partial Hospitalization Program or "PHP" (outpatient care).  [facility]  [facility] [employer], MS, LPC, LCAS, CCS  [place],?YS?[address]   Phone: [phone]    [phone] Located in heart of the beautiful [facility], [facility] provides an Intensive Outpatient Program known as IOP to The [place], [address] with the support of on-site certified & licensed professionals. [facility] serves men between 67 and 53 years old who are striving for Recovery, have completed in-patient treatment such as?residential?treatment or medical detox, have 30 days clean and sober, or for men whose outpatient treatment services  were not effective. In another program offering, [facility] is integrated with [facility] [place]'s Transitional Living Program to provide a unique Extended Care Recovery Treatment model. This program offers an array of high-quality recovery services and programs intertwined with Recovery Home and Sober Living Environments. Since 2006, [facility] has been providing high-quality programs and is known as one of the best programs around today offering Relapse Prevention, Life skills  Classes, 12-step support, Outdoor Adventure Therapy, & so much more. We are loving, committed, structured, and are here to help you gain back integrity, strength, and self-worth.   [facility],?FA?[id number]   Phone: [phone] [facility] offers inpatient,?residential, partial hospitalization and intensive outpatient programs (PHP/IOP), and outpatient sessions in [place] and [place].  [facility] offers specialty treatment for eating disorders dedicated to giving all people access to best-practice care and the tools they need for lasting recovery. Re[facility] May offers all levels of care, from 24/7 inpatient care through outpatient services across multiple states. In-person and virtual treatment options are available. Multidisciplinary treatment teams aim to equip individuals, families, and communities with the skills necessary to continue recovery in the home environment. Our admissions team is available to help you, your loved one, or your client get started Monday through Thursday from 8:00 AM- 8:00 PM ET, Fridays 8:00 AM-7:30 PM ET, and Saturdays and Sundays from 10:00 AM-6:00 PM. [employer] [facility] has three locations in [place], [place] with additional locations in [place], [place], and [place]. Our [place] locations are [place] for IP/RES ages 26-18, [place] for IP/RES ages 14 and up, and [place] for PHP/IOP/OP for ages 35 and up. Click the  "Nearby Areas" button on the right to find a location near you.  [facility], Treatment Center, LCAS, [place]  [place],?BM?[id number]   Phone: [phone] We give each person the best chance at?long term?recovery with our many services offered here.  We are a small intimate facility treating substance abuse with, or without, mental health disorders. We pride ourselves in our staff's dedication to recovery and many years of experience in the addiction and mental health fields. [facility] is dedicated to helping those wishing to overcome substance abuse issues.   [employer],?GU?[address]   Phone: [phone] Teens in our outpatient and?residential?programs build self-compassion, resilience, healthy coping skills, and more trusting relationships with parents and peers.  At [facility], we know that treatment financing can make all the difference in receiving quality treatment. To that end, we promote collaborative relationships with all healthcare payers to optimize the benefit coverage for families in need. We accept all major insurance, and up to 100 percent of our services are covered. Our goal is to partner with insurers to address the adolescent and young adult mental health crisis, by reaching more families and helping them achieve sustainable recovery. If you're interested in exploring the possibility of treatment at [facility] for your loved one's mental health, behavioral health, or substance abuse issues, we can begin the insurance verification process immediately. Furthermore, we are happy to obtain your insurance policy information and seek verification on your behalf. You can also expedite this process by completing the insurance verification form on our website. There is no obligation to either [employer] or to your insurance provider.   [facility], treatment Center, CCS, LCAS, LCSW, CSAC  [place],?FI?[id number]   Phone: [phone] [facility] is [place]'s [facility]. At [facility] we offer multiple levels of addiction & alcoholism treatment programs, each tailored to the clients' specific issues and needs. All of our programs are designed to help you gain insight into the disease of addiction while acquiring the life skills needed to sustain?long-term?recovery. [facility]'s professional, experienced, and caring staff develops individualized treatment plans for each patient. Therapies that will help every individual learn to manage their behavioral health problems. The path to sobriety requires patience and  dedication on behalf of the addict and their family. The decision to enter rehab is an important one, and we at [facility] understand and respect that. Our drug rehab program focuses on changing the patterns and daily habits that naturally develop when a loved one is addicted to drugs. Our therapists will also address the underlying psychological issues that may have caused the drug abuse in the first place. Clients will receive individual and necessary therapeutic attention while working on and replacing bad habits with good ones and creating a healthy lifestyle.  [facility] [employer] [facility]  [place],?ZO?[address]   Phone: [phone] Our?residential?program provides age-appropriate addiction treatment for youths ages 28-17.  Adolescents' bodies and brains are still developing, so when young people struggle with addictions, they can be negatively impacted in unique ways. Adolescents who have substance use disorders can experience impaired brain development, academic problems, and struggles with interpersonal relationships. They can also be at risk for developing mental health concerns. At [facility], we believe that addressing mental health concerns and addictions together through dual diagnosis treatment can lead to the most optimal results for young people.  We offer three dual diagnosis programs that are organized by gender and age group. Our [employer] serves youths ages 69-12 of all genders who are suffering from mental health concerns and co-occurring addictions. We also have?residential?programs for teen girls and boys ages 63-17 who need dual diagnosis treatment.   [facility],?YN?[address]   Phone: [phone] [facility] is a nationally recognized behavioral healthcare provider. Our psychiatric?residential?treatment facility (PRTF) provides comprehensive?residential?treatment to youth ages 34 to 57. Our facility is fully accredited by the Joint Commission and our programming offers Trauma Focused Evidence Based practices that produce improved outcomes for those we serve. Our programs offer treatment to those with a history of: sexually harmful behaviors, trauma, self-injury, failed attempts at lower levels of care, repeat stays in acute inpatient settings, aggression and violence towards others. This setting also operates a fully accredited private school and offer medical/dental care and on-site recreational therapy services.   [facility] of [place],?ON?[address]   Phone: [phone] The [facility] has been the pioneer in the treatment of eating disorders since 1985. As the nation's first?residential?eating disorder facility, now with 19 locations throughout the country, [facility] has helped more than 100,000 cisgender adolescent girls and adult women, transgender and non-binary individuals with eating disorders move towards recovery. [facility] provides women suffering from anorexia nervosa, bulimia nervosa, binge eating disorder, and related mental health problems with the tools they need to succeed in recovery and in life. [facility]'s extensive range of services includes?residential, day treatment, intensive outpatient, and outpatient programs. Each treatment level is built upon [employer] for Eating Disorders, an evidence-based, emotion-focused therapy that addresses eating disorders and co-morbid symptoms. Within this model, individual and group therapy are enhanced with a diverse array of services to meet patients' needs. [facility] accepts most major insurances and is a preferred provider for all levels of treatment.   [facility] Child psychotherapist for [employer],?UV?[address]   Phone: [phone] [facility]?Residential?Treatment Centers offers the highest caliber of evidence-based care in the nation for adolescents 12-17 struggling with mental health and addiction issues. We specialize in teens battling depression, anxiety, trauma, emotion dysregulation, high-risk/self-harm behaviors, Oppositional Defiant Disorder, ADHD, addiction, suicidal ideation, and other emotional/behavioral issues. [facility] treats teens--and teens only. Our treatment approach emphasizes Dialectical Behavior Therapy (DBT) and Cognitive Behavior  Therapy,?along with other evidence-based modalities such as Seeking Safety (trauma), Relapse?Prevention, Behavioral?Activation, Motivational Interviewing, Mindfulness-Based Cognitive Therapy, and 12-Step support programs.?Our goal is genuine recovery that lasts long after your child leaves treatment.?[facility]'s?robust?residential?program includes individual and family therapy, psychiatry, group therapy, experiential therapies (e.g.,?equine, surf, art, music, drama, yoga, etc.) and 24/7 skills-coaching. For out-of-state families, we offer family therapy via HIPAA-compliant video conferencing. We also provide daily academic support to keep teens on track with school.   [facility] Teen Dual Diagnosis Treatment  [place],?ZO?[address]   Phone: [phone] Only serving?up to six?teens at any given time, [facility]'s?residential?programs are suitable?for teens stepping down from inpatient hospitalization, or as a step-up option for teens requiring?a higher level  of care?than partial hospitalization or intensive outpatient programs.  At?[facility]?Residential?Treatment Centers, we?provide the highest caliber of evidence-based treatment?for?teens ages 38-17 struggling with depression, anxiety,?substance abuse, dual diagnosis, ADHD,?self-harming?or high-risk behaviors, suicidality,?Oppositional Defiant Disorder, and?other mental health/behavioral issues.? Our primary therapeutic modalities include Dialectical Behavior Therapy?(DBT) and Cognitive Behavioral Therapy. Teens participate in?individual?and?family therapy at least 5 times a week; weekly psychiatry sessions;?daily groups?(on?DBT?Skills Training, Anger Management, Seeking Safety, and Relapse Prevention); and daily academics. For families from out-of-state, we conduct family therapy remotely.?At [facility], our goal is compassionate, high-quality treatment that will last long after your teen leaves our program. We offer daily?experiential activities?proven to?support?recovery (e.g.?equine therapy, surf, art, drama,?music, yoga,?etc.)?and 12-Step/SMART Recovery?programs for?those?struggling with dual diagnoses. Our?emphasis?on safety and individualized attention are reasons why so many families from all over the country choose [facility].   [facility] Phone: [phone]  Toll-free Phone: [phone] Email: [email or web address] .Antoine Poche is an innovative, multi-year residential program that empowers people with substance use disorders to be productive, recovering individuals by providing comprehensive treatment, experiential vocational training, education, and continuing care. The primary requirement for admission to the [facility] program is that the individual must have a substance use disorder and desire a long-term residential treatment program. Burnett Harry is a voluntary program, and we require that the individual seeking admission must call or write to [place] directly and participate in a telephone interview.  While we welcome phone  calls from family members and friends to learn more, Burnett Harry is a Building surveyor, and it is important that the individual seeking treatment begins the recovery process by calling for themself.  Resident Eligibility: Be 18 years or older, have a substance use disorder and desire a multi-year residential program, Able to fully participate in [facility]'s therapeutic program.   Substance Abuse Treatment Programs  Intensive Outpatient Programs [facility]     [address]      [place], [place]                   [phone]       [facility] [address] [place] [address] [place], [place] [phone]  [facility]     (Inpatient and outpatient)     [address]           [phone]    [facility] [phone] (Suboxone and Methadone)  [address]      [place], [place] [address]      [phone]       [address] [place], [place] [phone]  [facility] (Outpatient/Inpatient, Chemical)    (insurance only) [phone]             [facility] (Groups & Residential) [place], [place] [phone]     [facility]     [address]  [place], [place]      [phone]       [facility] (for caregivers and family) [phone] [address]urell [address] [place], [place] [phone]      Residential Treatment Programs [facility]      [address], [place], [place] [address]  [phone]       [facility] [address] [place], [place] [address] [phone]  [facility]        [phone]       [facility] [phone]  [facility]             [address]                                         [place], [place]                                                [phone] or [phone]                               [facility] [address] [place], [address] [phone]  [facility]    [address]      [place], [place]     [phone]       [facility] [address] [place], [place] [phone]  [facility]   [address] [place], [place] [address]     [phone]      Admissions: 8am-3pm M-F  [facility] ([facility]) [address] [place], [place] [phone]  [facility]: Residential Program [phone] Days)   [place], [place]      [phone] or [phone]     [facility]: [facility] [place], [place] (Walk in Hours over the weekend or by referral)  [facility] [address] [place], [place], [place] [address] [phone]  Crisis Mobile: [facility]:  [phone] (for crisis response 24 hours a day) [facility] Hotline:      9150687012 Outpatient Psychiatry and Counseling  [facility]: Mobile Crisis Management 24 hours:  [phone]  [facility] [employer] sliding scale fee and walk in schedule: M-F 8am-12pm/1pm-3pm [address]  [place], [place] [address] [phone]  [facility] [address] [place], [place] [address] [phone]  [facility] ([facility]ly [facility] as [facility] [employer])- new patient walk-in appointments available Monday - Friday 8am -3pm.          [address] [place], [place] [address] [phone] or crisis line- [phone]  [facility] Services/ Intensive Outpatient Therapy Program [address] [place], [place] [address] [phone]  [facility]                  Crisis Services      [phone] [address]     [place], [place] [address]                 [facility]   [facility] [phone]. [address] [place], [place] [address]   [employer] [facility]  Care          [address] Bea Laura  [place], [place] [address]       [phone]  [facility] [address] [place], [place] [address] [phone]  [facility]    [address]    [place], [place] [address]     [phone]       Andee Poles, MD     [address]     [place] [address]     [phone]       [facility] [address] [place] [address]  [facility]     [address] [place], [place]      [phone]       [facility] Eulogio Ditch, MD [address] [place], [place] [address] [phone]  [facility]     [address]     [place], [place] [address]     [phone]       [facility] [address] [place], [place] [address] [phone] Resources for Temporary Residential Assistance/Crisis Centers  [facility] M-F 8am-3pm   [address] [place], [place] [address]   [phone] Services include: laundry, barbering, support groups, case management, phone  & computer access, showers, AA/NA mtgs, mental health/substance abuse nurse, job skills class, disability information, VA assistance, spiritual classes, etc.   HOMELESS SHELTERS  [facility]     [facility]   [address], [place]     [phone]              [employer] (women and children)       [address] [place], [place] [address] [phone] [email or web address] for application and process Application Required  [facility]   [address]    [place] [address]     [phone]                    [facility] [address] [place], [place] [address] [phone] application appt.) Application Required  [facility] (women only)    [address]     [place], [place] [address]     [phone]      Intake starts 6pm daily Need  valid ID, SSC, & Police report [employer] [address] [place], [place] [phone] Application Required  [employer] (men only)     [address]      [place], [place]     [phone]       [facility] (Pregnant women only) [address] [place], [place] [phone]  [facility]      [address]      [place], [place] [address]     [phone]             [facility] [address] [place], [place] [phone] 90 day commitment/SA/Application process  [facility](men only)     [address]     [place], [place]     [phone]       Check-in at [facility] [address] [place], [place] [address] [phone]  Men/Women/Women and Children must be there by 7 pm  [address] [place], [place] [phone]

## 2022-11-11 NOTE — ED Notes (Signed)
Pt. Resting in bed at the current c eyes closed. No s/s of acute distress, pain or any discomfort at this time. VSS. Safety maintained. Will continue to monitor and report any COC.

## 2022-11-11 NOTE — ED Notes (Signed)
Patient is sleeping. Respirations equal and unlabored, skin warm and dry. No change in assessment or acuity. Routine safety checks conducted according to facility protocol. Will continue to monitor for safety.   

## 2022-11-11 NOTE — ED Notes (Signed)
Pt discharged with  AVS.  AVS reviewed prior to discharge.  Pt alert, oriented, and ambulatory.  Safety maintained.  °

## 2022-11-11 NOTE — ED Notes (Signed)
Pt resting quietly, breathing is even and unlabored.  Pt denies SI, HI, pain and AVH.  Will continue to monitor for safety.

## 2024-03-30 ENCOUNTER — Emergency Department (HOSPITAL_COMMUNITY)
Admission: EM | Admit: 2024-03-30 | Discharge: 2024-03-31 | Disposition: A | Payer: Self-pay | Source: Home / Self Care | Attending: Emergency Medicine | Admitting: Emergency Medicine

## 2024-03-30 DIAGNOSIS — R002 Palpitations: Secondary | ICD-10-CM

## 2024-03-31 ENCOUNTER — Encounter (HOSPITAL_COMMUNITY): Payer: Self-pay | Admitting: Emergency Medicine

## 2024-03-31 ENCOUNTER — Other Ambulatory Visit: Payer: Self-pay

## 2024-03-31 ENCOUNTER — Emergency Department (HOSPITAL_COMMUNITY): Payer: Self-pay

## 2024-03-31 LAB — HCG, SERUM, QUALITATIVE: Preg, Serum: NEGATIVE

## 2024-03-31 LAB — CBC
HCT: 45 % (ref 36.0–46.0)
Hemoglobin: 14.6 g/dL (ref 12.0–15.0)
MCH: 30.2 pg (ref 26.0–34.0)
MCHC: 32.4 g/dL (ref 30.0–36.0)
MCV: 93.2 fL (ref 80.0–100.0)
Platelets: 298 10*3/uL (ref 150–400)
RBC: 4.83 MIL/uL (ref 3.87–5.11)
RDW: 12.4 % (ref 11.5–15.5)
WBC: 7.8 10*3/uL (ref 4.0–10.5)
nRBC: 0 % (ref 0.0–0.2)

## 2024-03-31 LAB — RESP PANEL BY RT-PCR (RSV, FLU A&B, COVID)  RVPGX2
Influenza A by PCR: NEGATIVE
Influenza B by PCR: NEGATIVE
Resp Syncytial Virus by PCR: NEGATIVE
SARS Coronavirus 2 by RT PCR: NEGATIVE

## 2024-03-31 LAB — BASIC METABOLIC PANEL WITH GFR
Anion gap: 12 (ref 5–15)
BUN: 8 mg/dL (ref 6–20)
CO2: 22 mmol/L (ref 22–32)
Calcium: 9.2 mg/dL (ref 8.9–10.3)
Chloride: 104 mmol/L (ref 98–111)
Creatinine, Ser: 0.68 mg/dL (ref 0.44–1.00)
GFR, Estimated: >60 mL/min
Glucose, Bld: 79 mg/dL (ref 70–99)
Potassium: 3.9 mmol/L (ref 3.5–5.1)
Sodium: 138 mmol/L (ref 135–145)

## 2024-03-31 NOTE — ED Triage Notes (Signed)
 Patient c/o elevated heart rate and sob that started tonight.  Patient is unsure if she has cardiac history or been around anyone sick. Patient states that she doesn't think she has pain anywhere.

## 2024-03-31 NOTE — ED Notes (Signed)
 Pt seen slamming her trash into the trash can and walking towards the exit with steady gait.

## 2024-03-31 NOTE — ED Notes (Signed)
 Per RT, patient refusing chest xray.

## 2024-03-31 NOTE — ED Notes (Signed)
 Pt refused lab work.

## 2024-03-31 NOTE — ED Provider Notes (Signed)
 " [place] [facility] Provider Note   CSN: [id number] Arrival date & time: 03/30/24  2345     Patient presents with: Palpitations and Shortness of Breath   Alejandra Park is a 39 y.o. female with PMHx polysubstance use disorder who presents to ED concern for episodes of SOB and palpitations starting last night. Patient denies chest pain, cough, nausea, vomiting, diarrhea. Denies hx DVT/PE, hemoptysis, hx cancer in the past 6 months, calf swelling/tenderness.  When asking patient if she currently feels SOB or palpitations she states I don't know. When asking patient if symptoms are getting better or worse from last night she states I don't know. Patient not able to go into more detail about the symptoms that she presented to ED for and is unsure if they are currently present or not.       Palpitations Associated symptoms: shortness of breath   Shortness of Breath      Prior to Admission medications  Not on File    Allergies: Patient has no known allergies.    Review of Systems  Respiratory:  Positive for shortness of breath.   Cardiovascular:  Positive for palpitations.    Updated Vital Signs BP 118/75 (BP Location: Left Arm)   Pulse (!) 104   Temp 98.1 F (36.7 C) (Oral)   Resp 18   Wt 81.6 kg   SpO2 100%   BMI 29.05 kg/m   Physical Exam Vitals and nursing note reviewed.  Constitutional:      General: She is not in acute distress.    Appearance: She is not ill-appearing or toxic-appearing.  HENT:     Head: Normocephalic and atraumatic.     Mouth/Throat:     Mouth: Mucous membranes are moist.     Pharynx: No posterior oropharyngeal erythema.  Eyes:     General: No scleral icterus.       Right eye: No discharge.        Left eye: No discharge.     Conjunctiva/sclera: Conjunctivae normal.  Cardiovascular:     Rate and Rhythm: Regular rhythm. Tachycardia present.     Pulses: Normal pulses.     Heart sounds: Normal  heart sounds. No murmur heard. Pulmonary:     Effort: Pulmonary effort is normal. No respiratory distress.     Breath sounds: Normal breath sounds. No wheezing, rhonchi or rales.  Abdominal:     General: Bowel sounds are normal.     Palpations: Abdomen is soft. There is no mass.     Tenderness: There is no abdominal tenderness.  Musculoskeletal:     Right lower leg: No edema.     Left lower leg: No edema.     Comments: No calf tenderness  Skin:    General: Skin is warm and dry.     Findings: No rash.  Neurological:     General: No focal deficit present.     Mental Status: She is alert and oriented to person, place, and time. Mental status is at baseline.  Psychiatric:        Mood and Affect: Mood normal.     (all labs ordered are listed, but only abnormal results are displayed) Labs Reviewed  RESP PANEL BY RT-PCR (RSV, FLU A&B, COVID)  [id number]  BASIC METABOLIC PANEL WITH GFR  CBC  HCG, SERUM, QUALITATIVE  D-DIMER, QUANTITATIVE  TROPONIN T, HIGH SENSITIVITY    EKG: EKG Interpretation Date/Time:  Friday March 31 2024 00:24:14 EST Ventricular  Rate:  94 PR Interval:  112 QRS Duration:  70 QT Interval:  328 QTC Calculation: 410 R Axis:   68  Text Interpretation: Normal sinus rhythm with sinus arrhythmia Right atrial enlargement Nonspecific ST and T wave abnormality Abnormal ECG When compared with ECG of 10-Nov-2022 19:31, PREVIOUS ECG IS PRESENT Confirmed by Lorette Mayo [phone]) on 03/31/2024 4:06:30 AM  Radiology: No results found.   Procedures   Medications Ordered in the ED - No data to display                                  Medical Decision Making Amount and/or Complexity of Data Reviewed Labs: ordered. Radiology: ordered.   This patient presents to the ED for concern of shortness of breath, this involves an extensive number of treatment options, and is a complaint that carries with it a high risk of complications and morbidity.  The differential  diagnosis includes Anxiety, Anaphylaxis/Angioedema, Aspirated FB, Arrhythmia, CHF, Asthma, COPD, PNA, COVID/Flu/RSV, STEMI, Tamponade, TPNX, Sepsis   Co morbidities that complicate the patient evaluation  Polysubstance use disorder   Additional history obtained:  No PCP listed in chart   Problem List / ED Course / Critical interventions / Medication management  Patient presents ED concern for SOB and palpitations.  Patient unable to provide any more insight about her the symptoms that she might or might not be having right now.  Physical exam reassuring.  Patient mildly tachycardic around 104 bpm but vitals are otherwise stable. Doubt emergent process d/t vague and unsure symptoms, but will obtain basic labs and troponin/d-dimer out of abundance of precaution. I Ordered, and personally interpreted labs.  hCG negative.  CBC without leukocytosis or anemia.  BMP reassuring.  Respiratory panel negative. The patient was maintained on a cardiac monitor.  I personally viewed and interpreted the cardiac monitored which showed an underlying rhythm of: Sinus rhythm. Patient refusing further workup. Did not receive chest xray. After ordering troponin and d-dimer patient became upset and eloped. I was not able to talk to her again prior to her eloping. I have reviewed the patients home medicines and have made adjustments as needed   Social Determinants of Health:  none       Final diagnoses:  Palpitations    ED Discharge Orders     None          Hoy Nidia FALCON, [place] 03/31/24 [id number]    Fredia Rosette Kirsch, MD 03/31/24 1605  "
